# Patient Record
Sex: Male | Born: 1965 | Hispanic: No | Smoking: Former smoker
Health system: Southern US, Community
[De-identification: ages and names within clinical notes are randomized; demographics above are authoritative.]

## PROBLEM LIST (undated history)

## (undated) DIAGNOSIS — E785 Hyperlipidemia, unspecified: Secondary | ICD-10-CM

## (undated) DIAGNOSIS — Z87442 Personal history of urinary calculi: Secondary | ICD-10-CM

## (undated) DIAGNOSIS — G5602 Carpal tunnel syndrome, left upper limb: Secondary | ICD-10-CM

## (undated) DIAGNOSIS — M79606 Pain in leg, unspecified: Secondary | ICD-10-CM

## (undated) DIAGNOSIS — M545 Low back pain: Secondary | ICD-10-CM

## (undated) DIAGNOSIS — I739 Peripheral vascular disease, unspecified: Secondary | ICD-10-CM

## (undated) DIAGNOSIS — K4090 Unilateral inguinal hernia, without obstruction or gangrene, not specified as recurrent: Secondary | ICD-10-CM

## (undated) DIAGNOSIS — M1990 Unspecified osteoarthritis, unspecified site: Secondary | ICD-10-CM

## (undated) DIAGNOSIS — Z789 Other specified health status: Secondary | ICD-10-CM

## (undated) HISTORY — DX: Low back pain: M54.5

## (undated) HISTORY — DX: Other specified health status: Z78.9

## (undated) HISTORY — PX: KIDNEY STONE SURGERY: SHX686

---

## 2004-03-05 HISTORY — PX: KNEE SURGERY: SHX244

## 2004-12-11 ENCOUNTER — Ambulatory Visit: Payer: Self-pay | Admitting: Family Medicine

## 2005-02-27 ENCOUNTER — Ambulatory Visit: Payer: Self-pay | Admitting: Specialist

## 2005-03-15 ENCOUNTER — Encounter: Payer: Self-pay | Admitting: Specialist

## 2005-03-21 ENCOUNTER — Ambulatory Visit: Payer: Self-pay | Admitting: Otolaryngology

## 2005-04-05 ENCOUNTER — Encounter: Payer: Self-pay | Admitting: Specialist

## 2005-12-19 ENCOUNTER — Emergency Department: Payer: Self-pay | Admitting: Emergency Medicine

## 2017-01-03 ENCOUNTER — Ambulatory Visit (INDEPENDENT_AMBULATORY_CARE_PROVIDER_SITE_OTHER): Payer: Self-pay | Admitting: General Surgery

## 2017-01-03 ENCOUNTER — Encounter: Payer: Self-pay | Admitting: General Surgery

## 2017-01-03 VITALS — BP 118/72 | HR 86 | Temp 98.1°F | Resp 16 | Ht 65.0 in | Wt 158.0 lb

## 2017-01-03 DIAGNOSIS — K6289 Other specified diseases of anus and rectum: Secondary | ICD-10-CM

## 2017-01-03 MED ORDER — AMOXICILLIN-POT CLAVULANATE 875-125 MG PO TABS: 1.0000 | ORAL_TABLET | Freq: Two times a day (BID) | ORAL | 0 refills | Status: AC

## 2017-01-03 NOTE — Progress Notes (Signed)
Patient ID: [name], male   DOB: 1965/09/06, 51 y.o.   MRN: [id number]  Chief Complaint  Patient presents with  . Rectal Bleeding    HPI [name] is a 51 y.o. male.  Here for evaluation of rectal pain an bleeding referred by Dr Delanna Notice. He states he has had bleeding and left rectal pain for about 10 days. Pain is described as "burning". Bleeding is not with every BM and only a small amount. Symptoms have improved with hemorrhoid cream that he has been using for the past two days. Denies any previous episodes. Denies fever.   HPI  Past Medical History:  Diagnosis Date  . Patient denies medical problems     Past Surgical History:  Procedure Laterality Date  . KNEE SURGERY  2006    Family History  Problem Relation Age of Onset  . Diabetes Mother     Social History Social History  Substance Use Topics  . Smoking status: Current Every Day Smoker    Packs/day: 0.50    Years: 30.00    Types: Cigarettes  . Smokeless tobacco: Never Used  . Alcohol use No    No Known Allergies  Current Outpatient Prescriptions  Medication Sig Dispense Refill  . hydrocortisone cream 1 % Apply 1 application topically 2 (two) times daily.    Marland Kitchen amoxicillin-clavulanate (AUGMENTIN) 875-125 MG tablet Take 1 tablet by mouth 2 (two) times daily. 14 tablet 0   No current facility-administered medications for this visit.     Review of Systems Review of Systems  Constitutional: Negative.   Respiratory: Negative.   Cardiovascular: Negative.   Gastrointestinal: Negative for abdominal pain, constipation and diarrhea.    Blood pressure 118/72, pulse 86, temperature 98.1 F (36.7 C), temperature source Oral, resp. rate 16, height 5\' 5"  (1.651 m), weight 158 lb (71.7 kg).  Physical Exam Physical Exam  Constitutional: He is oriented to person, place, and time. He appears well-developed and well-nourished.  HENT:  Mouth/Throat: Oropharynx is clear and moist.  Eyes:  Conjunctivae are normal. No scleral icterus.  Neck: Neck supple.  Cardiovascular: Normal rate, regular rhythm and normal heart sounds.   Pulmonary/Chest: Effort normal and breath sounds normal.  Abdominal: Soft.  Genitourinary: Rectal exam shows tenderness (significant tenderness with palpation of left side of the rectum, no drainage, redness,  or induration appreciated). Rectal exam shows no external hemorrhoid and no fissure.  Genitourinary Comments: No external or internal hemorrhoids appreciated  Lymphadenopathy:    He has no cervical adenopathy.  Neurological: He is alert and oriented to person, place, and time.  Skin: Skin is warm and dry.  Psychiatric: His behavior is normal.    Data Reviewed None  Assessment    Left sided rectal pain. No hemorrhoids or anal fissure appreciated. Symptoms and physical exam suggestive of developing  perirectal abscess. No signs of systemic infection. Empiric abx treatment with Augmentin.     Plan    Empiric abx- Augmentin. Discussed with patient that if symptoms do not start improving in 2-3 days or get worse or he develops a fever then let us know. Follow up in 1 week.      HPI, Physical Exam, Assessment and Plan have been scribed under the direction and in the presence of Kathreen Cosier, MD Dorathy Daft, RN  I have completed the exam and reviewed the above documentation for accuracy and completeness.  I agree with the above.  Museum/gallery conservator has been used and any errors in dictation or  transcription are unintentional.  Seeplaputhur G. Evette CristalSankar, M.D., F.A.C.S.  Gerlene BurdockSANKAR,SEEPLAPUTHUR G 01/04/2017, 11:08 AM

## 2017-01-03 NOTE — Patient Instructions (Signed)
The patient is aware to call back for any questions or concerns.  

## 2017-01-09 ENCOUNTER — Ambulatory Visit (INDEPENDENT_AMBULATORY_CARE_PROVIDER_SITE_OTHER): Payer: Self-pay | Admitting: General Surgery

## 2017-01-09 ENCOUNTER — Encounter: Payer: Self-pay | Admitting: General Surgery

## 2017-01-09 VITALS — BP 102/68 | HR 98 | Resp 14 | Ht 65.0 in | Wt 157.0 lb

## 2017-01-09 DIAGNOSIS — K6289 Other specified diseases of anus and rectum: Secondary | ICD-10-CM

## 2017-01-09 MED ORDER — CIPROFLOXACIN HCL 500 MG PO TABS: 500.0000 mg | ORAL_TABLET | Freq: Two times a day (BID) | ORAL | 0 refills | Status: AC

## 2017-01-09 MED ORDER — METRONIDAZOLE 500 MG PO TABS
500.0000 mg | ORAL_TABLET | Freq: Three times a day (TID) | ORAL | 0 refills | Status: AC
Start: 2017-01-09 — End: 2017-01-16

## 2017-01-09 NOTE — Progress Notes (Signed)
Patient ID: [name], male   DOB: [birth year], 51 y.o.   MRN: [id number]  Chief Complaint  Patient presents with  . Follow-up    HPI [name] is a 51 y.o. male here today for his follow up rectal pain. Patient states he is doing much better. Admits mild pain. Bowel movements are okay. Denies drainage. Has one day of antibiotics left.  HPI  Past Medical History:  Diagnosis Date  . Patient denies medical problems     Past Surgical History:  Procedure Laterality Date  . KNEE SURGERY  2006    Family History  Problem Relation Age of Onset  . Diabetes Mother     Social History Social History   Tobacco Use  . Smoking status: Current Every Day Smoker    Packs/day: 0.50    Years: 30.00    Pack years: 15.00    Types: Cigarettes  . Smokeless tobacco: Never Used  Substance Use Topics  . Alcohol use: No  . Drug use: No    No Known Allergies  Current Outpatient Medications  Medication Sig Dispense Refill  . amoxicillin-clavulanate (AUGMENTIN) 875-125 MG tablet Take 1 tablet by mouth 2 (two) times daily. 14 tablet 0  . hydrocortisone cream 1 % Apply 1 application topically 2 (two) times daily.    . ciprofloxacin (CIPRO) 500 MG tablet Take 1 tablet (500 mg total) 2 (two) times daily for 10 days by mouth. 20 tablet 0  . metroNIDAZOLE (FLAGYL) 500 MG tablet Take 1 tablet (500 mg total) 3 (three) times daily for 7 days by mouth. 21 tablet 0   No current facility-administered medications for this visit.     Review of Systems Review of Systems  Constitutional: Negative.   Respiratory: Negative.   Cardiovascular: Negative.     Blood pressure 102/68, pulse 98, resp. rate 14, height 5\' 5"  (1.651 m), weight 157 lb (71.2 kg).  Physical Exam Physical Exam  Constitutional: He is oriented to person, place, and time. He appears well-developed and well-nourished.  Abdominal: Soft. Bowel sounds are normal. There is no tenderness.  Genitourinary: Rectal exam shows  tenderness (Significant tenderness with palpation on the left rectal area. No induration, bleeding, or drainage appreciated. ). Rectal exam shows no external hemorrhoid, no internal hemorrhoid and anal tone normal.  Neurological: He is alert and oriented to person, place, and time.  Skin: Skin is warm and dry.    Data Reviewed Prior notes reviewed   Assessment      Continued tenderness in the left rectal area suspicious for abscess. Some improvement with oral antibiotics. No signs of systemic infection. Discussed diagnostic CT vs. Continued antibiotic course for management options. Pt elected for another week of antibiotics, ciprofloxacin and flagyl. Re-evaluate after antibiotic course. If pain does not resolve then plan for CT imaging.   Plan    Patient to return in three weeks. The patient is aware to call back for any questions or concerns.      HPI, Physical Exam, Assessment and Plan have been scribed under the direction and in the presence of Kathreen CosierS. [name]. [name], MD  Ples SpecterJessica Qualls, CMA    I have completed the exam and reviewed the above documentation for accuracy and completeness.  I agree with the above.  Museum/gallery conservatorDragon Technology has been used and any errors in dictation or transcription are unintentional.  [name]. Evette CristalSankar, M.D., F.A.C.S.  Gerlene BurdockSANKAR,[name] 01/10/2017, 7:52 AM

## 2017-01-09 NOTE — Patient Instructions (Addendum)
Patient to return in three weeks.  

## 2017-01-23 ENCOUNTER — Ambulatory Visit (INDEPENDENT_AMBULATORY_CARE_PROVIDER_SITE_OTHER): Payer: Self-pay | Admitting: General Surgery

## 2017-01-23 ENCOUNTER — Encounter: Payer: Self-pay | Admitting: General Surgery

## 2017-01-23 VITALS — BP 122/74 | HR 91 | Resp 14 | Ht 65.0 in | Wt 159.0 lb

## 2017-01-23 DIAGNOSIS — K6289 Other specified diseases of anus and rectum: Secondary | ICD-10-CM

## 2017-01-23 NOTE — Progress Notes (Signed)
Patient ID: [name], male   DOB: 1965-03-13, 51 y.o.   MRN: [id number]  Chief Complaint  Patient presents with  . Follow-up    HPI [name] is a 51 y.o. male.  Here today for follow up rectal pain. He denies any pain or bleeidng. He completed the antibiotics 2 days ago.  HPI  Past Medical History:  Diagnosis Date  . Patient denies medical problems     Past Surgical History:  Procedure Laterality Date  . KNEE SURGERY  2006    Family History  Problem Relation Age of Onset  . Diabetes Mother     Social History Social History   Tobacco Use  . Smoking status: Current Every Day Smoker    Packs/day: 0.50    Years: 30.00    Pack years: 15.00    Types: Cigarettes  . Smokeless tobacco: Never Used  Substance Use Topics  . Alcohol use: No  . Drug use: No    No Known Allergies  No current outpatient medications on file.   No current facility-administered medications for this visit.     Review of Systems Review of Systems  Constitutional: Negative.   Respiratory: Negative.   Cardiovascular: Negative.     Blood pressure 122/74, pulse 91, resp. rate 14, height 5\' 5"  (1.651 m), weight 159 lb (72.1 kg).  Physical Exam Physical Exam  Constitutional: He is oriented to person, place, and time. He appears well-developed and well-nourished.  Genitourinary:  Genitourinary Comments: No rectal tenderness  Neurological: He is alert and oriented to person, place, and time.  Skin: Skin is warm and dry.  Psychiatric: His behavior is normal.    Data Reviewed Prior notes reviewed     Assessment    Resolved rectal pain-suspected perirectal abscess, appears resolved with antibiotic    Plan   Advised the abscess may recur  and to call for any recurrence of symptoms Recommend colonoscopy next year,  he is to call when he is ready to proceed     HPI, Physical Exam, Assessment and Plan have been scribed under the direction and in the presence of Kathreen CosierS. G.  Britton Perkinson, MD Dorathy DaftMarsha Hatch, RN  I have completed the exam and reviewed the above documentation for accuracy and completeness.  I agree with the above.  Museum/gallery conservatorDragon Technology has been used and any errors in dictation or transcription are unintentional.  Livi Mcgann G. Evette CristalSankar, M.D., F.A.C.S.  Gerlene BurdockSANKAR,[place] Dal[place]o G 01/28/2017, 4:28 PM

## 2017-01-23 NOTE — Patient Instructions (Addendum)
The patient is aware to call back for any questions or concerns.  Recommend colonoscopy next year, he is to call when he is ready to proceed

## 2017-01-28 ENCOUNTER — Encounter: Payer: Self-pay | Admitting: General Surgery

## 2017-02-27 ENCOUNTER — Telehealth: Payer: Self-pay | Admitting: *Deleted

## 2017-02-27 NOTE — Telephone Encounter (Signed)
Patients son [name] called regarding getting the patient set up for a Colonoscopy. He last saw Dr.[name] on 01/23/17 and was told that he could go ahead a proceed with a Colonoscopy in 2019 and patient is ready to get that scheduled. Does the patient need to come in and see you first prior to getting the colonoscopy scheduled?

## 2017-03-06 NOTE — Telephone Encounter (Signed)
If the patient has not had a recurrence of his rectal pain symptoms for which he saw Dr. Orbie HurstSang Carr in November 2018, a preoperative visit prior to a screening colonoscopy is not required.

## 2017-03-14 ENCOUNTER — Ambulatory Visit: Payer: BLUE CROSS/BLUE SHIELD | Admitting: General Surgery

## 2017-03-14 ENCOUNTER — Encounter: Payer: Self-pay | Admitting: General Surgery

## 2017-03-14 VITALS — BP 115/78 | HR 68 | Resp 14 | Ht 65.0 in | Wt 160.0 lb

## 2017-03-14 DIAGNOSIS — Z1211 Encounter for screening for malignant neoplasm of colon: Secondary | ICD-10-CM

## 2017-03-14 DIAGNOSIS — K6289 Other specified diseases of anus and rectum: Secondary | ICD-10-CM | POA: Diagnosis not present

## 2017-03-14 MED ORDER — POLYETHYLENE GLYCOL 3350 17 GM/SCOOP PO POWD: ORAL | 0 refills | Status: DC

## 2017-03-14 NOTE — Patient Instructions (Addendum)
Colonoscopy, Adult A colonoscopy is an exam to look at the large intestine. It is done to check for problems, such as:  Lumps (tumors).  Growths (polyps).  Swelling (inflammation).  Bleeding.  What happens before the procedure? Eating and drinking Follow instructions from your doctor about eating and drinking. These instructions may include:  A few days before the procedure - follow a low-fiber diet. ? Avoid nuts. ? Avoid seeds. ? Avoid dried fruit. ? Avoid raw fruits. ? Avoid vegetables.  1-3 days before the procedure - follow a clear liquid diet. Avoid liquids that have red or purple dye. Drink only clear liquids, such as: ? Clear broth or bouillon. ? Black coffee or tea. ? Clear juice. ? Clear soft drinks or sports drinks. ? Gelatin dessert. ? Popsicles.  On the day of the procedure - do not eat or drink anything during the 2 hours before the procedure.  Bowel prep If you were prescribed an oral bowel prep:  Take it as told by your doctor. Starting the day before your procedure, you will need to drink a lot of liquid. The liquid will cause you to poop (have bowel movements) until your poop is almost clear or light green.  If your skin or butt gets irritated from diarrhea, you may: ? Wipe the area with wipes that have medicine in them, such as adult wet wipes with aloe and vitamin E. ? Put something on your skin that soothes the area, such as petroleum jelly.  If you throw up (vomit) while drinking the bowel prep, take a break for up to 60 minutes. Then begin the bowel prep again. If you keep throwing up and you cannot take the bowel prep without throwing up, call your doctor.  General instructions  Ask your doctor about changing or stopping your normal medicines. This is important if you take diabetes medicines or blood thinners.  Plan to have someone take you home from the hospital or clinic. What happens during the procedure?  An IV tube may be put into one of  your veins.  You will be given medicine to help you relax (sedative).  To reduce your risk of infection: ? Your doctors will wash their hands. ? Your anal area will be washed with soap.  You will be asked to lie on your side with your knees bent.  Your doctor will get a long, thin, flexible tube ready. The tube will have a camera and a light on the end.  The tube will be put into your anus.  The tube will be gently put into your large intestine.  Air will be delivered into your large intestine to keep it open. You may feel some pressure or cramping.  The camera will be used to take photos.  A small tissue sample may be removed from your body to be looked at under a microscope (biopsy). If any possible problems are found, the tissue will be sent to a lab for testing.  If small growths are found, your doctor may remove them and have them checked for cancer.  The tube that was put into your anus will be slowly removed. The procedure may vary among doctors and hospitals. What happens after the procedure?  Your doctor will check on you often until the medicines you were given have worn off.  Do not drive for 24 hours after the procedure.  You may have a small amount of blood in your poop.  You may pass gas.  You may  have mild cramps or bloating in your belly (abdomen).  It is up to you to get the results of your procedure. Ask your doctor, or the department performing the procedure, when your results will be ready. This information is not intended to replace advice given to you by your health care provider. Make sure you discuss any questions you have with your health care provider. Document Released: 03/24/2010 Document Revised: 12/21/2015 Document Reviewed: 05/03/2015 Elsevier Interactive Patient Education  2017 Elsevier [employer]   May use a hemorrhoid suppository to aid with bowel movement when needed. These are available over the counter.   Anusol Suppository.

## 2017-03-14 NOTE — Progress Notes (Signed)
Patient ID: [name], male   DOB: 13-Nov-1965, 52 y.o.   MRN: [id number]  Chief Complaint  Patient presents with  . Other    rectal pain    HPI [name] is a 52 y.o. male here for follow up from rectal pain and to discuss having a colonoscopy. He is here today with his son, [name].  The patient appears to have excellent comprehension of English, but his son was helpful elucidating some points regarding his father's intermittent discomfort. He reports that he has had rectal pain on the left side and bleeding with a bowel movement. He has had this for the last 2 months off and on and has been treated with antibiotics for a possible abscess. He states the pain when he moves his bowels and the pain goes away slowly.  He reports that in the last 3 days the pain has gone away, but he still feels like something is still not right.  Generally, markedly improved from October.  Occasionally has a small spot of bright red blood after bowel movements. He reports no fever or chills.   HPI  Past Medical History:  Diagnosis Date  . Patient denies medical problems     Past Surgical History:  Procedure Laterality Date  . KNEE SURGERY  2006    Family History  Problem Relation Age of Onset  . Diabetes Mother     Social History Social History   Tobacco Use  . Smoking status: Current Every Day Smoker    Packs/day: 0.50    Years: 30.00    Pack years: 15.00    Types: Cigarettes  . Smokeless tobacco: Never Used  Substance Use Topics  . Alcohol use: No  . Drug use: No    No Known Allergies  Current Outpatient Medications  Medication Sig Dispense Refill  . polyethylene glycol powder (GLYCOLAX/MIRALAX) powder 255 grams one bottle for colonoscopy prep 255 g 0   No current facility-administered medications for this visit.     Review of Systems Review of Systems  Constitutional: Negative.   Respiratory: Negative.   Cardiovascular: Negative.   Gastrointestinal: Positive  for blood in stool and rectal pain. Negative for abdominal distention, abdominal pain, anal bleeding, constipation, diarrhea, nausea and vomiting.    Blood pressure 115/78, pulse 68, resp. rate 14, height 5\' 5"  (1.651 m), weight 160 lb (72.6 kg).  Physical Exam Physical Exam  Constitutional: He is oriented to person, place, and time. He appears well-developed and well-nourished.  Eyes: Conjunctivae are normal. No scleral icterus.  Neck: Neck supple.  Cardiovascular: Normal rate, regular rhythm and normal heart sounds.  Pulmonary/Chest: Effort normal and breath sounds normal.  Abdominal: Normal appearance and bowel sounds are normal. There is no tenderness.  Genitourinary: Rectal exam shows tenderness.     Lymphadenopathy:    He has no cervical adenopathy.    He has no axillary adenopathy.  Neurological: He is alert and oriented to person, place, and time.  Skin: Skin is warm and dry.  Psychiatric: He has a normal mood and affect.      Assessment    No evidence of perirectal abscess.  Tenderness along the anal canal, but possible occult fissure.  Small amount of rectal bleeding.    Plan     Colonoscopy with possible biopsy/polypectomy prn: Information regarding the procedure, including its potential risks and complications (including but not limited to perforation of the bowel, which may require emergency surgery to repair, and bleeding) was verbally given to the  patient. Educational information regarding lower intestinal endoscopy was given to the patient. Written instructions for how to complete the bowel prep using Miralax were provided. The importance of drinking ample fluids to avoid dehydration as a result of the prep emphasized.  HPI, Physical Exam, Assessment and Plan have been scribed under the direction and in the presence of Earline MayotteJeffrey W. Celena Lanius, MD  Carron Brazenaryl-Lyn Kennedy, LPN   Merrily PewJeffrey W Sheryll Dymek 03/15/2017, 7:25 PM  Patient has [facility]. Miralax prescription has been sent in to the patient's pharmacy today. Colonoscopy instructions have been reviewed with the patient. This patient is aware to call the office if they have further questions.   Nicholes MangoMichele J. Bailey, CMA

## 2017-03-15 DIAGNOSIS — Z1211 Encounter for screening for malignant neoplasm of colon: Secondary | ICD-10-CM | POA: Insufficient documentation

## 2017-03-15 DIAGNOSIS — K6289 Other specified diseases of anus and rectum: Secondary | ICD-10-CM | POA: Insufficient documentation

## 2017-03-18 ENCOUNTER — Telehealth: Payer: Self-pay | Admitting: *Deleted

## 2017-03-18 MED ORDER — DOXYCYCLINE HYCLATE 100 MG PO CAPS: 100.0000 mg | ORAL_CAPSULE | Freq: Two times a day (BID) | ORAL | 1 refills | Status: DC

## 2017-03-18 NOTE — Telephone Encounter (Signed)
Will RX with Doxycycline to minimize exposure to broad spectrum antibiotics.

## 2017-03-18 NOTE — Telephone Encounter (Signed)
Patients son Renella CunasHooman called stating that when the patient saw Dr.[name] on 01/03/17 he prescribed him Augmentin to help with the rectal pain. He started the Augmentin on 01/03/17 and finished them on 01/21/17. The last couple of days patients son stated that his rectal pain is coming back and was wanting to know could he get more antibiotic until his colonoscopy, which is scheduled for 03/27/16

## 2017-03-19 NOTE — Telephone Encounter (Signed)
Left message for patient to call the office back

## 2017-03-20 NOTE — Telephone Encounter (Signed)
Patient called back and is aware that RX was called into the pharmacy, take twice a day and to avoid dairy products.

## 2017-03-26 ENCOUNTER — Telehealth: Payer: Self-pay | Admitting: Neurology

## 2017-03-26 ENCOUNTER — Encounter: Payer: Self-pay | Admitting: *Deleted

## 2017-03-26 NOTE — Telephone Encounter (Signed)
Spoke with patient's doctor who asked us to get patient an appointment sooner if possible. We called patient and offered him an appointment tomorrow and he declined. We will try to find him something else but it may be several weeks out, patient understands.

## 2017-03-27 ENCOUNTER — Ambulatory Visit: Payer: BLUE CROSS/BLUE SHIELD | Admitting: Anesthesiology

## 2017-03-27 ENCOUNTER — Ambulatory Visit
Admission: RE | Admit: 2017-03-27 | Discharge: 2017-03-27 | Disposition: A | Discharge status: Home / Self Care | Payer: BLUE CROSS/BLUE SHIELD | Source: Ambulatory Visit | Attending: General Surgery | Admitting: General Surgery

## 2017-03-27 ENCOUNTER — Encounter: Payer: Self-pay | Admitting: *Deleted

## 2017-03-27 ENCOUNTER — Encounter
Admission: RE | Disposition: A | Discharge status: Home / Self Care | Payer: Self-pay | Source: Ambulatory Visit | Attending: General Surgery

## 2017-03-27 DIAGNOSIS — F17210 Nicotine dependence, cigarettes, uncomplicated: Secondary | ICD-10-CM | POA: Insufficient documentation

## 2017-03-27 DIAGNOSIS — Z1211 Encounter for screening for malignant neoplasm of colon: Secondary | ICD-10-CM | POA: Diagnosis not present

## 2017-03-27 HISTORY — PX: COLONOSCOPY WITH PROPOFOL: SHX5780

## 2017-03-27 SURGERY — COLONOSCOPY WITH PROPOFOL
Anesthesia: General

## 2017-03-27 MED ORDER — PROPOFOL 500 MG/50ML IV EMUL
INTRAVENOUS | Status: AC
  Filled 2017-03-27: qty 50

## 2017-03-27 MED ORDER — PHENYLEPHRINE HCL 10 MG/ML IJ SOLN
INTRAMUSCULAR | Status: DC | PRN
  Administered 2017-03-27: 100 ug via INTRAVENOUS

## 2017-03-27 MED ORDER — GLYCOPYRROLATE 0.2 MG/ML IJ SOLN
INTRAMUSCULAR | Status: DC | PRN
  Administered 2017-03-27: 0.2 mg via INTRAVENOUS

## 2017-03-27 MED ORDER — LIDOCAINE HCL (CARDIAC) 20 MG/ML IV SOLN
INTRAVENOUS | Status: DC | PRN
  Administered 2017-03-27: 80 mg via INTRAVENOUS

## 2017-03-27 MED ORDER — PROPOFOL 500 MG/50ML IV EMUL
INTRAVENOUS | Status: DC | PRN
  Administered 2017-03-27: 160 ug/kg/min via INTRAVENOUS

## 2017-03-27 MED ORDER — MIDAZOLAM HCL 2 MG/2ML IJ SOLN
INTRAMUSCULAR | Status: AC
  Filled 2017-03-27: qty 2

## 2017-03-27 MED ORDER — GLYCOPYRROLATE 0.2 MG/ML IJ SOLN
INTRAMUSCULAR | Status: AC
  Filled 2017-03-27: qty 1

## 2017-03-27 MED ORDER — SODIUM CHLORIDE 0.9 % IV SOLN
INTRAVENOUS | Status: DC
  Administered 2017-03-27: 1000 mL via INTRAVENOUS
  Administered 2017-03-27: 08:00:00 via INTRAVENOUS

## 2017-03-27 MED ORDER — LIDOCAINE HCL (PF) 2 % IJ SOLN
INTRAMUSCULAR | Status: AC
  Filled 2017-03-27: qty 10

## 2017-03-27 MED ORDER — MIDAZOLAM HCL 2 MG/2ML IJ SOLN
INTRAMUSCULAR | Status: DC | PRN
  Administered 2017-03-27: 2 mg via INTRAVENOUS

## 2017-03-27 MED ORDER — PROPOFOL 10 MG/ML IV BOLUS
INTRAVENOUS | Status: DC | PRN
  Administered 2017-03-27: 60 mg via INTRAVENOUS

## 2017-03-27 NOTE — Anesthesia Post-op Follow-up Note (Signed)
Anesthesia QCDR form completed.        

## 2017-03-27 NOTE — Transfer of Care (Signed)
Immediate Anesthesia Transfer of Care Note  Patient: [name]  Procedure(s) Performed: COLONOSCOPY WITH PROPOFOL (N/A )  Patient Location: PACU  Anesthesia Type:General  Level of Consciousness: drowsy and patient cooperative  Airway & Oxygen Therapy: Patient Spontanous Breathing and Patient connected to nasal cannula oxygen  Post-op Assessment: Report given to RN and Post -op Vital signs reviewed and stable  Post vital signs: Reviewed and stable  Last Vitals:  Vitals:   03/27/17 0719 03/27/17 0810  BP: 112/69 101/66  Pulse: 63 63  Resp: 16 20  Temp: (!) 36.1 C (!) 36.1 C  SpO2:  98%    Last Pain:  Vitals:   03/27/17 0810  TempSrc: Tympanic         Complications: No apparent anesthesia complications

## 2017-03-27 NOTE — Anesthesia Preprocedure Evaluation (Signed)
Anesthesia Evaluation  Patient identified by MRN, date of birth, ID band Patient awake    Reviewed: Allergy & Precautions, NPO status , Patient's Chart, lab work & pertinent test results  History of Anesthesia Complications Negative for: history of anesthetic complications  Airway Mallampati: II  TM Distance: >3 FB Neck ROM: Full    Dental no notable dental hx.    Pulmonary neg sleep apnea, neg COPD, Current Smoker,    breath sounds clear to auscultation- rhonchi (-) wheezing      Cardiovascular Exercise Tolerance: Good (-) hypertension(-) CAD and (-) Past MI  Rhythm:Regular Rate:Normal - Systolic murmurs and - Diastolic murmurs    Neuro/Psych negative neurological ROS  negative psych ROS   GI/Hepatic negative GI ROS, Neg liver ROS,   Endo/Other  negative endocrine ROSneg diabetes  Renal/GU negative Renal ROS     Musculoskeletal negative musculoskeletal ROS (+)   Abdominal (+) - obese,   Peds  Hematology negative hematology ROS (+)   Anesthesia Other Findings   Reproductive/Obstetrics                             Anesthesia Physical Anesthesia Plan  ASA: II  Anesthesia Plan: General   Post-op Pain Management:    Induction: Intravenous  PONV Risk Score and Plan: 0 and Propofol infusion  Airway Management Planned: Natural Airway  Additional Equipment:   Intra-op Plan:   Post-operative Plan:   Informed Consent: I have reviewed the patients History and Physical, chart, labs and discussed the procedure including the risks, benefits and alternatives for the proposed anesthesia with the patient or authorized representative who has indicated his/her understanding and acceptance.   Dental advisory given  Plan Discussed with: CRNA and Anesthesiologist  Anesthesia Plan Comments:         Anesthesia Quick Evaluation

## 2017-03-27 NOTE — H&P (Signed)
No change in clinical history or exam. Less perineal discomfort with oral antibiotics. Tolerated prep well. For colonoscopy.

## 2017-03-27 NOTE — Op Note (Signed)
[facility] Patient Name: [name] Procedure Date: 03/27/2017 7:48 AM MRN: [id number] Account #: [id number] Date of Birth: 23-Sep-1965 Admit Type: Outpatient Age: 52 Room: [facility] ENDO ROOM 1 Gender: Male Note Status: Finalized Procedure:            Colonoscopy Indications:          Screening for colorectal malignant neoplasm Providers:            Earline Mayotte, MD Referring MD:         Sherrie Mustache, MD (Referring MD) Medicines:            Monitored Anesthesia Care Complications:        No immediate complications. Procedure:            Pre-Anesthesia Assessment:                       - Prior to the procedure, a History and Physical was                        performed, and patient medications, allergies and                        sensitivities were reviewed. The patient's tolerance of                        previous anesthesia was reviewed.                       - The risks and benefits of the procedure and the                        sedation options and risks were discussed with the                        patient. All questions were answered and informed                        consent was obtained.                       After obtaining informed consent, the colonoscope was                        passed under direct vision. Throughout the procedure,                        the patient's blood pressure, pulse, and oxygen                        saturations were monitored continuously. The                        Colonoscope was introduced through the anus and                        advanced to the the terminal ileum. The colonoscopy was                        performed without difficulty. The patient tolerated the  procedure well. The quality of the bowel preparation                        was excellent. Findings:      The entire examined colon appeared normal on direct and retroflexion       views. Impression:            - The entire examined colon is normal on direct and                        retroflexion views.                       - No specimens collected. Recommendation:       - Return to endoscopist in 2 weeks. Procedure Code(s):    --- Professional ---                       [id number], Colonoscopy, flexible; diagnostic, including                        collection of specimen(s) by brushing or washing, when                        performed (separate procedure) Diagnosis Code(s):    --- Professional ---                       Z12.11, Encounter for screening for malignant neoplasm                        of colon CPT copyright 2016 American Medical Association. All rights reserved. The codes documented in this report are preliminary and upon coder review may  be revised to meet current compliance requirements. Earline MayotteJeffrey W. Marty Uy, MD 03/27/2017 8:07:42 AM This report has been signed electronically. Number of Addenda: 0 Note Initiated On: 03/27/2017 7:48 AM Scope Withdrawal Time: 0 hours 8 minutes 54 seconds  Total Procedure Duration: 0 hours 13 minutes 0 seconds       [facility]

## 2017-03-27 NOTE — Anesthesia Postprocedure Evaluation (Signed)
Anesthesia Post Note  Patient: [name]  Procedure(s) Performed: COLONOSCOPY WITH PROPOFOL (N/A )  Patient location during evaluation: Endoscopy Anesthesia Type: General Level of consciousness: awake and alert and oriented Pain management: pain level controlled Vital Signs Assessment: post-procedure vital signs reviewed and stable Respiratory status: spontaneous breathing, nonlabored ventilation and respiratory function stable Cardiovascular status: blood pressure returned to baseline and stable Postop Assessment: no signs of nausea or vomiting Anesthetic complications: no     Last Vitals:  Vitals:   03/27/17 0830 03/27/17 0838  BP:    Pulse: 66 63  Resp: 15 17  Temp:    SpO2: 96% 96%    Last Pain:  Vitals:   03/27/17 0810  TempSrc: Tympanic                 [name]

## 2017-03-28 ENCOUNTER — Encounter: Payer: Self-pay | Admitting: General Surgery

## 2017-04-04 ENCOUNTER — Ambulatory Visit: Payer: BLUE CROSS/BLUE SHIELD | Admitting: Neurology

## 2017-04-04 ENCOUNTER — Encounter: Payer: Self-pay | Admitting: Neurology

## 2017-04-04 DIAGNOSIS — M5441 Lumbago with sciatica, right side: Secondary | ICD-10-CM | POA: Diagnosis not present

## 2017-04-04 DIAGNOSIS — M5442 Lumbago with sciatica, left side: Secondary | ICD-10-CM

## 2017-04-04 DIAGNOSIS — M545 Low back pain, unspecified: Secondary | ICD-10-CM

## 2017-04-04 DIAGNOSIS — G8929 Other chronic pain: Secondary | ICD-10-CM

## 2017-04-04 HISTORY — DX: Low back pain, unspecified: M54.50

## 2017-04-04 NOTE — Patient Instructions (Signed)
We will get MRI of the low back and get EMG and NCV study to look at the nerve function of the legs.

## 2017-04-04 NOTE — Progress Notes (Signed)
Reason for visit: Back pain, leg pain  Referring physician: Dr. Audery AmelJadali  [name] is a 52 y.o. male  History of present illness:  [name] is a 52 year old right-handed [place] male with a history of low back pain that began 4 to 5 months ago.  The patient indicates that the problem began spontaneously, he is having pain in the back that may go down into the anterior thighs to the knees, and if he continues to walk the pain will go down to the feet bilaterally.  Eventually, if he continues to walk the legs will begin to have some numbness and tingling sensations.  The patient indicates that if he rests, the discomfort will go away.  He begins having pain down the legs after taking only 20 steps or so.  He works as a Merchandiser, retailsupervisor, his job requires a lot of walking, he is having problems managing at work.  The patient feels that his legs may get weak at times.  He has not had any falls.  He denies issues controlling the bowels or the bladder.  The patient indicates that if he stands and he does not walk, this does not induce pain.  With sitting or lying down he is very comfortable without discomfort.  The patient denies any neck pain or pain down the arms.  He denies any significant balance issues.  He is sent to this office for an evaluation.  He has undergone chiropractic treatments without benefit, he has undergone a treatment with a steroid Dosepak with transient benefit.  He has undergone an x-ray of the low back that shows mild endplate osteophyte formation at the L4 and L5 level anteriorly.  There is some facet joint arthritis at the L5-S1 level.   Past Medical History:  Diagnosis Date  . Low back pain 04/04/2017  . Patient denies medical problems     Past Surgical History:  Procedure Laterality Date  . COLONOSCOPY WITH PROPOFOL N/A 03/27/2017   Procedure: COLONOSCOPY WITH PROPOFOL;  Surgeon: Earline MayotteByrnett, Jeffrey W, MD;  Location: [facility] ENDOSCOPY;  Service: Endoscopy;   Laterality: N/A;  . KIDNEY STONE SURGERY    . KNEE SURGERY  2006    Family History  Problem Relation Age of Onset  . Diabetes Mother   . Heart disease Mother   . Alzheimer's disease Father     Social history:  reports that he quit smoking 4 days ago. he has never used smokeless tobacco. He reports that he does not drink alcohol or use drugs.  Medications:  Prior to Admission medications   Not on File     No Known Allergies  ROS:  Out of a complete 14 system review of symptoms, the patient complains only of the following symptoms, and all other reviewed systems are negative.  Back pain, leg pain  Blood pressure 107/70, pulse 73, height 5\' 5"  (1.651 m), weight 160 lb (72.6 kg).  Physical Exam  General: The patient is alert and cooperative at the time of the examination.  Eyes: Pupils are equal, round, and reactive to light. Discs are flat bilaterally.  Neck: The neck is supple, no carotid bruits are noted.  Respiratory: The respiratory examination is clear.  Cardiovascular: The cardiovascular examination reveals a regular rate and rhythm, no obvious murmurs or rubs are noted.  Neuromuscular: The patient is able to flex the back to around 110 degrees.  He has good extension and rotational movements of the back.  He has no significant discomfort with  palpation of the back or over the SI joints.  Skin: Extremities are without significant edema.  Neurologic Exam  Mental status: The patient is alert and oriented x 3 at the time of the examination. The patient has apparent normal recent and remote memory, with an apparently normal attention span and concentration ability.  Cranial nerves: Facial symmetry is present. There is good sensation of the face to pinprick and soft touch bilaterally. The strength of the facial muscles and the muscles to head turning and shoulder shrug are normal bilaterally. Speech is well enunciated, no aphasia or dysarthria is noted. Extraocular  movements are full. Visual fields are full. The tongue is midline, and the patient has symmetric elevation of the soft palate. No obvious hearing deficits are noted.  Motor: The motor testing reveals 5 over 5 strength of all 4 extremities. Good symmetric motor tone is noted throughout.  Sensory: Sensory testing is intact to pinprick, soft touch, vibration sensation, and position sense on all 4 extremities. No evidence of extinction is noted.  Coordination: Cerebellar testing reveals good finger-nose-finger and heel-to-shin bilaterally.  Gait and station: Gait is normal. Tandem gait is normal. Romberg is negative. No drift is seen.  The patient is able to walk on heels and the toes bilaterally.  Reflexes: Deep tendon reflexes are symmetric and normal bilaterally. Toes are downgoing bilaterally.   Assessment/Plan:  1.  Low back pain, bilateral leg pain  The patient is describing back pain and leg discomfort that begins with walking, and is improved with rest.  The pain is worse on the left leg than the right.  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  The patient will undergo MRI of the lumbar spine, he will have nerve conduction studies done on both legs and EMG on the left leg.  The patient has failed conservative treatment with chiropractic treatments and steroid administration.  [name] MD 04/04/2017 9:39 AM  [facility] [address] [place], [place] [address]  Phone [phone] Fax [phone]

## 2017-04-09 ENCOUNTER — Ambulatory Visit
Admission: RE | Admit: 2017-04-09 | Discharge: 2017-04-09 | Disposition: A | Discharge status: Home / Self Care | Payer: BLUE CROSS/BLUE SHIELD | Source: Ambulatory Visit | Attending: Neurology | Admitting: Neurology

## 2017-04-09 DIAGNOSIS — M5441 Lumbago with sciatica, right side: Principal | ICD-10-CM

## 2017-04-09 DIAGNOSIS — G8929 Other chronic pain: Secondary | ICD-10-CM

## 2017-04-09 DIAGNOSIS — M5442 Lumbago with sciatica, left side: Principal | ICD-10-CM

## 2017-04-10 ENCOUNTER — Telehealth: Payer: Self-pay | Admitting: Neurology

## 2017-04-10 DIAGNOSIS — Z5181 Encounter for therapeutic drug level monitoring: Secondary | ICD-10-CM

## 2017-04-10 DIAGNOSIS — R0989 Other specified symptoms and signs involving the circulatory and respiratory systems: Secondary | ICD-10-CM

## 2017-04-10 NOTE — Telephone Encounter (Signed)
I called the son, I discussed the treatment plan with him, he appears to understand and will discuss this with his father.

## 2017-04-10 NOTE — Telephone Encounter (Signed)
Pts son is calling wanting Dr. Anne HahnWillis to call him back mainly to discuss everything since his father didn't fully understand. Please call back at [phone]

## 2017-04-10 NOTE — Telephone Encounter (Signed)
I called the patient.  MRI of the lumbar spine was unremarkable, but there was some evidence of a possible mural thrombus or dissection within the aorta.  This could potentially result in the claudication symptoms that the patient is reporting.  The patient will come in for blood work to check the kidney function, we will then order a CT angiogram of the abdomen and pelvis.   MRI lumbar 04/09/17:  IMPRESSION: 1. Mild for age disc bulging at L1-2, L4-5, and L5-S1 without significant stenosis or neural impingement. 2. Mild facet bilateral facet hypertrophy at L3-4 through L5-S1. 3. Curvilinear density within the infrarenal aorta as above. While this finding may reflect mural thrombus due to underlying atheromatous disease, a possible thrombosed dissection could also have this appearance. Further evaluation with dedicated CTA of the abdomen and pelvis suggested for further characterization.

## 2017-04-11 ENCOUNTER — Other Ambulatory Visit: Payer: Self-pay | Admitting: *Deleted

## 2017-04-11 ENCOUNTER — Other Ambulatory Visit (INDEPENDENT_AMBULATORY_CARE_PROVIDER_SITE_OTHER): Payer: Self-pay

## 2017-04-11 DIAGNOSIS — Z0289 Encounter for other administrative examinations: Secondary | ICD-10-CM

## 2017-04-11 DIAGNOSIS — Z5181 Encounter for therapeutic drug level monitoring: Secondary | ICD-10-CM

## 2017-04-12 ENCOUNTER — Telehealth: Payer: Self-pay

## 2017-04-12 LAB — COMPREHENSIVE METABOLIC PANEL
ALBUMIN: 4.4 g/dL (ref 3.5–5.5)
ALT: 51 IU/L — ABNORMAL HIGH (ref 0–44)
AST: 34 IU/L (ref 0–40)
Albumin/Globulin Ratio: 1.8 (ref 1.2–2.2)
Alkaline Phosphatase: 82 IU/L (ref 39–117)
BUN / CREAT RATIO: 16 (ref 9–20)
BUN: 11 mg/dL (ref 6–24)
Bilirubin Total: 1.2 mg/dL (ref 0.0–1.2)
CO2: 25 mmol/L (ref 20–29)
CREATININE: 0.67 mg/dL — AB (ref 0.76–1.27)
Calcium: 9.7 mg/dL (ref 8.7–10.2)
Chloride: 103 mmol/L (ref 96–106)
GFR calc Af Amer: 128 mL/min/{1.73_m2} (ref >59)
GFR calc non Af Amer: 110 mL/min/{1.73_m2} (ref >59)
GLOBULIN, TOTAL: 2.5 g/dL (ref 1.5–4.5)
Glucose: 95 mg/dL (ref 65–99)
Potassium: 4.2 mmol/L (ref 3.5–5.2)
SODIUM: 141 mmol/L (ref 134–144)
Total Protein: 6.9 g/dL (ref 6.0–8.5)

## 2017-04-12 NOTE — Telephone Encounter (Signed)
I spoke to pt. I advised him that his blood work was unremarkable except for a minimal increase in ALT which was not clinically significant, pt's renal function is adequate and pt is ok for CT angiogram. Pt verbalized understanding of results. Pt had no questions at this time but was encouraged to call back if questions arise.

## 2017-04-12 NOTE — Telephone Encounter (Signed)
-----   Message from York Spanielharles K Willis, MD sent at 04/12/2017  7:31 AM EST ----- Blood work is unremarkable exception of minimal increase in ALT level, not clinically significant.  Renal function is adequate, okay for CT angiogram. Please call the patient. ----- Message ----- From: Interface, [facility] Lab Results In Sent: 04/12/2017   5:41 AM To: York Spanielharles K Willis, MD

## 2017-04-17 ENCOUNTER — Encounter (INDEPENDENT_AMBULATORY_CARE_PROVIDER_SITE_OTHER): Payer: Self-pay

## 2017-04-17 ENCOUNTER — Ambulatory Visit (INDEPENDENT_AMBULATORY_CARE_PROVIDER_SITE_OTHER): Payer: BLUE CROSS/BLUE SHIELD | Admitting: Neurology

## 2017-04-17 ENCOUNTER — Encounter: Payer: Self-pay | Admitting: Neurology

## 2017-04-17 ENCOUNTER — Ambulatory Visit: Payer: BLUE CROSS/BLUE SHIELD | Admitting: Neurology

## 2017-04-17 DIAGNOSIS — G8929 Other chronic pain: Secondary | ICD-10-CM

## 2017-04-17 DIAGNOSIS — M5441 Lumbago with sciatica, right side: Secondary | ICD-10-CM

## 2017-04-17 DIAGNOSIS — M5442 Lumbago with sciatica, left side: Secondary | ICD-10-CM

## 2017-04-17 NOTE — Procedures (Signed)
     HISTORY:  [name] is a 52 year old gentleman with a history of claudication type symptoms in both legs associated with walking.  The patient is being evaluated for possible neuropathy or radiculopathy that would explain his symptoms.  The symptoms are worse in the left leg than the right.  NERVE CONDUCTION STUDIES:  Nerve conduction studies were performed on both lower extremities. The distal motor latencies and motor amplitudes for the peroneal and posterior tibial nerves were within normal limits. The nerve conduction velocities for these nerves were also normal. The H reflex latencies were normal. The sensory latencies for the peroneal nerves were within normal limits.   EMG STUDIES:  EMG study was performed on the left lower extremity:  The tibialis anterior muscle reveals 2 to 4K motor units with full recruitment. No fibrillations or positive waves were seen. The peroneus tertius muscle reveals 2 to 4K motor units with full recruitment. No fibrillations or positive waves were seen. The medial gastrocnemius muscle reveals 1 to 3K motor units with full recruitment. No fibrillations or positive waves were seen. The vastus lateralis muscle reveals 2 to 4K motor units with full recruitment. No fibrillations or positive waves were seen. The iliopsoas muscle reveals 2 to 4K motor units with full recruitment. No fibrillations or positive waves were seen. The biceps femoris muscle (long head) reveals 2 to 4K motor units with full recruitment. No fibrillations or positive waves were seen. The lumbosacral paraspinal muscles were tested at 3 levels, and revealed no abnormalities of insertional activity at all 3 levels tested. There was good relaxation.   IMPRESSION:  Nerve conduction studies done on both lower extremities were within normal limits.  No evidence of a neuropathy is seen.  EMG evaluation of the left lower extremity was unremarkable without evidence of an overlying  lumbosacral radiculopathy.  [name]. [name] MD 04/17/2017 1:45 PM  [facility] [address] [place], [place] [id number]  Phone [phone] Fax [phone]

## 2017-04-17 NOTE — Progress Notes (Signed)
Please refer to EMG and nerve conduction study procedure note. 

## 2017-04-17 NOTE — Progress Notes (Addendum)
The patient is to be evaluated with a CT angiogram looking at the distal aorta and iliac vessels.     MNC    Nerve / Sites Muscle Latency Ref. Amplitude Ref. Rel Amp Segments Distance Velocity Ref. Area    ms ms mV mV %  cm m/s m/s mVms  L Peroneal - EDB     Ankle EDB 2.7 ?6.5 10.1 ?2.0 100 Ankle - EDB 9   31.5     Fib head EDB 10.6  8.6  84.6 Fib head - Ankle 36 45 ?44 28.1     Pop fossa EDB 12.4  8.6  101 Pop fossa - Fib head 10 53 ?44 28.5         Pop fossa - Ankle      R Peroneal - EDB     Ankle EDB 4.6 ?6.5 2.6 ?2.0 100 Ankle - EDB 9   8.9     Fib head EDB 12.1  2.4  90.6 Fib head - Ankle 36 48 ?44 8.4     Pop fossa EDB 14.0  2.0  84.4 Pop fossa - Fib head 10 53 ?44 6.8         Pop fossa - Ankle      L Tibial - AH     Ankle AH 3.3 ?5.8 9.6 ?4.0 100 Ankle - AH 9   20.4     Pop fossa AH 12.1  5.7  59.3 Pop fossa - Ankle 44 50 ?41 14.8  R Tibial - AH     Ankle AH 4.6 ?5.8 10.5 ?4.0 100 Ankle - AH 9   22.4     Pop fossa AH 13.0  10.1  96 Pop fossa - Ankle 44 52 ?41 21.8             SNC    Nerve / Sites Rec. Site Peak Lat Ref.  Amp Ref. Segments Distance    ms ms V V  cm  R Superficial peroneal - Ankle     Lat leg Ankle 3.1 ?4.4 18 ?6 Lat leg - Ankle 14  L Superficial peroneal - Ankle     Lat leg Ankle 2.9 ?4.4 13 ?6 Lat leg - Ankle 14         H Reflex    Nerve H Lat Lat Hmax   ms ms   Left Right Ref. Left Right Ref.  Tibial - Soleus 30.8 30.0 ?35.0 31.4 34.2 ?35.0         EMG

## 2017-04-23 ENCOUNTER — Encounter: Payer: Self-pay | Admitting: Neurology

## 2017-04-24 ENCOUNTER — Telehealth: Payer: Self-pay | Admitting: Neurology

## 2017-04-24 NOTE — Telephone Encounter (Signed)
I called the technician.  The MRI of the lumbar spine found what appeared to be thrombus within the wall of the aorta, we are mainly interested in looking at the infrarenal aorta and iliac vessels.

## 2017-04-24 NOTE — Telephone Encounter (Signed)
Marty/GI [phone] order for angiogram on abdomen and pelvis, she is wanting to make sure Dr Anne HahnWillis is not interested in seeing any other vessels in the whole leg past the hip. She said the patients hx reminds her of what vascular surgeons request as a runoff, if interested she can change it. Pt has appt on Friday 04/26/17. Please call to advise

## 2017-04-26 ENCOUNTER — Ambulatory Visit
Admission: RE | Admit: 2017-04-26 | Discharge: 2017-04-26 | Disposition: A | Discharge status: Home / Self Care | Payer: BLUE CROSS/BLUE SHIELD | Source: Ambulatory Visit | Attending: Neurology | Admitting: Neurology

## 2017-04-26 ENCOUNTER — Telehealth: Payer: Self-pay | Admitting: Neurology

## 2017-04-26 DIAGNOSIS — R0989 Other specified symptoms and signs involving the circulatory and respiratory systems: Secondary | ICD-10-CM

## 2017-04-26 DIAGNOSIS — I739 Peripheral vascular disease, unspecified: Secondary | ICD-10-CM

## 2017-04-26 MED ORDER — IOPAMIDOL (ISOVUE-300) INJECTION 61%: 75.0000 mL | Freq: Once | INTRAVENOUS | Status: DC | PRN

## 2017-04-26 MED ORDER — IOPAMIDOL (ISOVUE-370) INJECTION 76%
75.0000 mL | Freq: Once | INTRAVENOUS | Status: AC | PRN
  Administered 2017-04-26: 75 mL via INTRAVENOUS

## 2017-04-26 NOTE — Telephone Encounter (Signed)
  I called the patient.  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  I will refer the patient to vascular surgery for further evaluation.  This likely is the cause of the claudication symptoms.  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 Features related to collateralization suggest a chronic age favored over acute occlusion. This could certainly cause the patient's symptoms related to lower extremity pain and weakness.  NON-VASCULAR  Left nephrolithiasis.

## 2017-04-29 ENCOUNTER — Ambulatory Visit (INDEPENDENT_AMBULATORY_CARE_PROVIDER_SITE_OTHER): Payer: BLUE CROSS/BLUE SHIELD | Admitting: Vascular Surgery

## 2017-04-29 ENCOUNTER — Encounter (INDEPENDENT_AMBULATORY_CARE_PROVIDER_SITE_OTHER): Payer: Self-pay | Admitting: Vascular Surgery

## 2017-04-29 ENCOUNTER — Encounter (INDEPENDENT_AMBULATORY_CARE_PROVIDER_SITE_OTHER): Payer: BLUE CROSS/BLUE SHIELD | Admitting: Vascular Surgery

## 2017-04-29 DIAGNOSIS — I7409 Other arterial embolism and thrombosis of abdominal aorta: Secondary | ICD-10-CM | POA: Diagnosis not present

## 2017-04-29 DIAGNOSIS — M79604 Pain in right leg: Secondary | ICD-10-CM | POA: Diagnosis not present

## 2017-04-29 DIAGNOSIS — I70213 Atherosclerosis of native arteries of extremities with intermittent claudication, bilateral legs: Secondary | ICD-10-CM

## 2017-04-29 DIAGNOSIS — M79605 Pain in left leg: Secondary | ICD-10-CM

## 2017-04-30 ENCOUNTER — Encounter (INDEPENDENT_AMBULATORY_CARE_PROVIDER_SITE_OTHER): Payer: Self-pay | Admitting: Vascular Surgery

## 2017-04-30 DIAGNOSIS — I7409 Other arterial embolism and thrombosis of abdominal aorta: Secondary | ICD-10-CM | POA: Insufficient documentation

## 2017-04-30 DIAGNOSIS — M79606 Pain in leg, unspecified: Secondary | ICD-10-CM | POA: Insufficient documentation

## 2017-04-30 DIAGNOSIS — I739 Peripheral vascular disease, unspecified: Secondary | ICD-10-CM | POA: Insufficient documentation

## 2017-04-30 NOTE — Progress Notes (Signed)
MRN : [id number]  [name] is a 52 y.o. (1965-11-21) male who presents with chief complaint of  Chief Complaint  Patient presents with  . New Patient (Initial Visit)    Possible mural thrombosis/CT abdomen on 2/22  .  History of Present Illness:  The patient is seen for evaluation of painful lower extremities and diminished pulses. Patient notes the pain is always associated with activity and is very consistent day today. Typically, the pain occurs at less than one block, he noted that he would not be able to walk from the exam room to his car in the parking lot without stopping at least once.  The pain progresses as the activity continues to the point that the patient must stop walking. Resting including standing still for several minutes allowed resumption of the activity and the ability to walk a similar distance before stopping again. Uneven terrain and inclined shorten the distance. The pain has been progressive over the past several years. The patient states the inability to walk is now having a profound negative impact on quality of life and daily activities.  The patient was undergoing evaluation for low back pain associated with leg pain MRI demonstrated a mural thrombus within the aorta and subsequently a formal CT angiogram was obtained which demonstrated the distal aortic occlusion associated with bilateral common iliac occlusions.  There is reconstitution of the external iliac arteries bilaterally via the internal iliac arteries.    The patient also describes erectile dysfunction  The patient denies rest pain or dangling of an extremity off the side of the bed during the night for relief. No open wounds or sores at this time. No prior interventions or surgeries.  No history of back problems or DJD of the lumbar sacral spine.   The patient denies changes in claudication symptoms or new rest pain symptoms.  No new ulcers or wounds of the foot.  The patient's blood  pressure has been stable and relatively well controlled. The patient denies amaurosis fugax or recent TIA symptoms. There are no recent neurological changes noted. The patient denies history of DVT, PE or superficial thrombophlebitis. The patient denies recent episodes of angina or shortness of breath.   No outpatient medications have been marked as taking for the 04/29/17 encounter (Office Visit) with Gilda Crease, Latina Craver, MD.    Past Medical History:  Diagnosis Date  . Low back pain 04/04/2017  . Patient denies medical problems     Past Surgical History:  Procedure Laterality Date  . COLONOSCOPY WITH PROPOFOL N/A 03/27/2017   Procedure: COLONOSCOPY WITH PROPOFOL;  Surgeon: Earline Mayotte, MD;  Location: [facility] ENDOSCOPY;  Service: Endoscopy;  Laterality: N/A;  . KIDNEY STONE SURGERY    . KNEE SURGERY  2006    Social History Social History   Tobacco Use  . Smoking status: Former Smoker    Last attempt to quit: 03/31/2017    Years since quitting: 0.0  . Smokeless tobacco: Never Used  Substance Use Topics  . Alcohol use: No  . Drug use: No    Family History Family History  Problem Relation Age of Onset  . Diabetes Mother   . Heart disease Mother   . Alzheimer's disease Father   No family history of bleeding/clotting disorders, porphyria or autoimmune disease   No Known Allergies   REVIEW OF SYSTEMS (Negative unless checked)  Constitutional: [] Weight loss  [] Fever  [] Chills Cardiac: [] Chest pain   [] Chest pressure   [] Palpitations   [] Shortness  of breath when laying flat   [] Shortness of breath with exertion. Vascular:  [x] Pain in legs with walking   [] Pain in legs at rest  [] History of DVT   [] Phlebitis   [] Swelling in legs   [] Varicose veins   [] Non-healing ulcers Pulmonary:   [] Uses home oxygen   [] Productive cough   [] Hemoptysis   [] Wheeze  [] COPD   [] Asthma Neurologic:  [] Dizziness   [] Seizures   [] History of stroke   [] History of TIA  [] Aphasia   [] Vissual changes    [] Weakness or numbness in arm   [] Weakness or numbness in leg Musculoskeletal:   [] Joint swelling   [] Joint pain   [x] Low back pain Hematologic:  [] Easy bruising  [] Easy bleeding   [] Hypercoagulable state   [] Anemic Gastrointestinal:  [] Diarrhea   [] Vomiting  [] Gastroesophageal reflux/heartburn   [] Difficulty swallowing. Genitourinary:  [] Chronic kidney disease   [] Difficult urination  [] Frequent urination   [] Blood in urine Skin:  [] Rashes   [] Ulcers  Psychological:  [] History of anxiety   []  History of major depression.  Physical Examination  Vitals:   04/29/17 1405  BP: 108/70  Pulse: 77  Resp: 16  Weight: 158 lb (71.7 kg)  Height: 5\' 5"  (1.651 m)   Body mass index is 26.29 kg/m. Gen: WD/WN, NAD Head: [place]/AT, No temporalis wasting.  Ear/Nose/Throat: Hearing grossly intact, nares w/o erythema or drainage, poor dentition Eyes: PER, EOMI, sclera nonicteric.  Neck: Supple, no masses.  No bruit or JVD.  Pulmonary:  Good air movement, clear to auscultation bilaterally, no use of accessory muscles.  Cardiac: RRR, normal S1, S2, no Murmurs. Vascular:  Vessel Right Left  Radial Palpable Palpable  Femoral  not palpable  not palpable  Popliteal  not palpable  not palpable  PT  not palpable  not palpable  DP  not palpable  not palpable   Gastrointestinal: soft, non-distended. No guarding/no peritoneal signs.  Musculoskeletal: M/S 5/5 throughout.  No deformity or atrophy.  Neurologic: CN 2-12 intact. Pain and light touch intact in extremities.  Symmetrical.  Speech is fluent. Motor exam as listed above. Psychiatric: Judgment intact, Mood & affect appropriate for pt's clinical situation. Dermatologic: No rashes or ulcers noted.  No changes consistent with cellulitis. Lymph : No Cervical lymphadenopathy, no lichenification or skin changes of chronic lymphedema.  CBC No results found for: WBC, HGB, HCT, MCV, PLT  BMET    Component Value Date/Time   NA 141 04/11/2017 0912   K 4.2  04/11/2017 0912   CL 103 04/11/2017 0912   CO2 25 04/11/2017 0912   GLUCOSE 95 04/11/2017 0912   BUN 11 04/11/2017 0912   CREATININE 0.67 (L) 04/11/2017 0912   CALCIUM 9.7 04/11/2017 0912   GFRNONAA 110 04/11/2017 0912   GFRAA 128 04/11/2017 0912   Estimated Creatinine Clearance: 94 mL/min (A) (by C-G formula based on SCr of 0.67 mg/dL (L)).  COAG No results found for: INR, PROTIME  Radiology Mr Lumbar Spine Wo Contrast  Result Date: 04/09/2017 CLINICAL DATA:  Initial evaluation for low back pain with anterior bilateral leg and buttock pain. Bilateral leg weakness. EXAM: MRI LUMBAR SPINE WITHOUT CONTRAST TECHNIQUE: Multiplanar, multisequence MR imaging of the lumbar spine was performed. No intravenous contrast was administered. COMPARISON:  None available. FINDINGS: Segmentation: Normal segmentation. Lowest well-formed disc labeled the L5-S1 level. Alignment: Mild levoscoliosis. Vertebral bodies otherwise normally aligned with preservation of the normal lumbar lordosis. No listhesis. Vertebrae: Vertebral body heights well maintained without evidence for acute or chronic fracture.  Bone marrow signal intensity within normal limits. No discrete or worrisome osseous lesions. Mild reactive endplate changes with small Schmorl's nodes noted about the L1-2 interspace. Additional small degenerative endplate Schmorl's node noted at the inferior endplate of T11. No abnormal marrow edema. Conus medullaris and cauda equina: Conus extends to the L1-2 level. Conus and cauda equina appear normal. Paraspinal and other soft tissues: Paraspinous soft tissues demonstrate no acute abnormality. 15 mm T2 hyperintense simple cyst noted within the right kidney. Curvilinear density noted within the infrarenal aorta, possible reflecting mural thrombus (series 6, image 12). Thrombosed dissection could also have this appearance. Remainder of the visualized visceral structures otherwise within normal limits. Disc levels: L1-2:  Mild diffuse disc bulge with disc desiccation. No canal or neural foraminal stenosis. L2-3:  Unremarkable. L3-4: Normal interspace. Mild facet and ligamentum flavum hypertrophy. No canal or neural foraminal stenosis. L4-5: Mild diffuse disc bulge with disc desiccation. Small central annular fissure noted. Mild facet and ligamentum flavum hypertrophy. No significant canal or lateral recess stenosis. Foramina remain patent. L5-S1: Disc desiccation with mild posterior disc bulge. Associated annular fissure. Mild facet hypertrophy. No significant canal or lateral recess stenosis. Foramina remain patent. IMPRESSION: 1. Mild for age disc bulging at L1-2, L4-5, and L5-S1 without significant stenosis or neural impingement. 2. Mild facet bilateral facet hypertrophy at L3-4 through L5-S1. 3. Curvilinear density within the infrarenal aorta as above. While this finding may reflect mural thrombus due to underlying atheromatous disease, a possible thrombosed dissection could also have this appearance. Further evaluation with dedicated CTA of the abdomen and pelvis suggested for further characterization. These results will be called to the ordering clinician or representative by the Radiologist Assistant, and communication documented in the PACS or zVision Dashboard. Electronically Signed   By: Rise MuBenjamin  McClintock M.D.   On: 04/09/2017 21:34   Ct Angio Abd/pel W/ And/or W/o  Result Date: 04/26/2017 CLINICAL DATA:  Abnormal aorta noted on lumbar MRI. EXAM: CTA ABDOMEN AND PELVIS wITHOUT AND WITH CONTRAST TECHNIQUE: Multidetector CT imaging of the abdomen and pelvis was performed using the standard protocol during bolus administration of intravenous contrast. Multiplanar reconstructed images and MIPs were obtained and reviewed to evaluate the vascular anatomy. CONTRAST:  75mL ISOVUE-370 IOPAMIDOL (ISOVUE-370) INJECTION 76% COMPARISON:  Lumbar MRI 04/09/2017 FINDINGS: VASCULAR Aorta: Lower thoracic aorta, upper abdominal aorta,  and juxtarenal aorta are patent and nonaneurysmal. There is smooth plaque causing 50% narrowing in the lower abdominal aorta. Just below the takeoff of the IMA, the aorta is occluded. There is hypertrophy of the IMA branches as well as the inferior epigastric arteries Celiac: Patent. SMA: Patent. Renals: Single renal arteries are patent. IMA: Patent. There is hypertrophy of the IMA allowing for collateralization of the lower extremity vascular supply. Inflow: Right common iliac artery is occluded. It reconstitutes just above the bifurcation. Right internal and external iliac arteries are patent. Left common iliac artery is occluded. It reconstitutes just above the bifurcation. Left internal and external iliac arteries are patent. Proximal Outflow: Grossly patent. Veins: No evidence of DVT. Review of the MIP images confirms the above findings. NON-VASCULAR Lower chest: Scattered subsegmental atelectasis. Hepatobiliary: Wedge-shaped enhancement on arterial phase images in the posterior right lobe likely represents a vascular phenomenon. Diffuse hepatic steatosis is noted. Normal gallbladder. Pancreas: Unremarkable Spleen: Several lobulated cysts. Adrenals/Urinary Tract: 4 mm calculus in the left upper pole. Small simple cyst in the right kidney. Adrenal glands are unremarkable. Bladder is within normal limits. Stomach/Bowel: Stomach and duodenum are unremarkable. There  is no evidence of small-bowel obstruction. Normal appendix. No obvious mass in the colon moderate stool burden throughout the colon. Lymphatic: No abnormal retroperitoneal adenopathy. Reproductive: Prostate is enlarged. Other: No free fluid. Musculoskeletal: No vertebral compression deformity. IMPRESSION: VASCULAR There is occlusion of the distal aorta and common iliac arteries as described. Features related to collateralization suggest a chronic age favored over acute occlusion. This could certainly cause the patient's symptoms related to lower  extremity pain and weakness. NON-VASCULAR Left nephrolithiasis. Electronically Signed   By: Jolaine Click M.D.   On: 04/26/2017 12:55     Assessment/Plan 1. Atherosclerosis of native artery of both lower extremities with intermittent claudication (HCC)  Recommend:  The patient has evidence of severe atherosclerotic changes of both lower extremities associated with distal aortic occlusion and lifestyle limiting claudication.  He is still working and is concerned that his inability to ambulate could cost him his job.  CT Angiography has been performed and the situation is not ideal for intervention.  He has distal aortic occlusion with extensive mural thrombus in association with bilateral common iliac artery occlusion.  There is reconstitution of both external iliac arteries which appear to have minimal disease.  Given this finding open surgical repair is recommended, the patient should undergo aortobiiliac bypass grafting.  Traditional surgical reconstruction is also recommended based on his young age and lack of any other significant cardiopulmonary disease.  Patient should undergo arterial reconstruction of the lower extremity.  The risks and benefits as well as the alternative therapies was discussed in detail with the patient.  Alternative therapies including intervention with thrombolysis overnight and the risks of bleeding were discussed in great detail.  All questions were answered.  Patient wishes to consider his options and will get back to me.  The patient will follow up with me in the office after the procedure.    A total of 70 minutes was spent with this patient and greater than 50% was spent in counseling and coordination of care with the patient.  Discussion included the treatment options for vascular disease including indications for surgery and intervention.  Also discussed is the appropriate timing of treatment.  In addition medical therapy was discussed.   2. Chronic distal  aortic occlusion (HCC) See #1  3. Pain in both lower extremities I believe this is secondary to his aortic occlusion and given the severity of his claudication symptoms and his significant limitations have recommended traditional aortobiiliac bypass grafting.  Again, the patient will consider this he is concerned about the amount of time he would be away from work and losing his job.    Levora Dredge, MD  04/30/2017 8:57 AM

## 2017-05-07 ENCOUNTER — Encounter (INDEPENDENT_AMBULATORY_CARE_PROVIDER_SITE_OTHER): Payer: BLUE CROSS/BLUE SHIELD | Admitting: Vascular Surgery

## 2017-05-08 ENCOUNTER — Telehealth: Payer: Self-pay | Admitting: Cardiovascular Disease

## 2017-05-08 ENCOUNTER — Other Ambulatory Visit: Payer: Self-pay

## 2017-05-08 DIAGNOSIS — Z01818 Encounter for other preprocedural examination: Secondary | ICD-10-CM

## 2017-05-08 NOTE — Telephone Encounter (Signed)
[name], Douglas A, MD  [name], Douglas Seevers H, RN        Dr. Dario GuardianJadali talked with me about this patient. He needs preop for vascular surgery. He is scheduled to see me on Tuesday. I want him to have [name] Lexiscan Myoview (for preop) before his visit with me.    Order placed. Left message on pt's cell VM to contact the office.

## 2017-05-08 NOTE — Telephone Encounter (Signed)
Per Dr. Kirke CorinArida, may add patient at 1:20pm on 3/12. Left detailed message on pt's VM.

## 2017-05-08 NOTE — Telephone Encounter (Signed)
Patient returned call. Reviewed/explained lexi myoview testing. Patient verbalized understanding and is agreeable to March 7, 7:15am. He asks if he could see Dr. Kirke CorinArida on Tuesday at an earlier time as he works second shift and goes to work at [employer]. I have sent a message to MD.

## 2017-05-09 ENCOUNTER — Encounter
Admission: RE | Admit: 2017-05-09 | Discharge: 2017-05-09 | Disposition: A | Discharge status: Home / Self Care | Payer: BLUE CROSS/BLUE SHIELD | Source: Ambulatory Visit | Attending: Cardiovascular Disease | Admitting: Cardiovascular Disease

## 2017-05-09 DIAGNOSIS — Z01818 Encounter for other preprocedural examination: Secondary | ICD-10-CM | POA: Insufficient documentation

## 2017-05-09 DIAGNOSIS — I2510 Atherosclerotic heart disease of native coronary artery without angina pectoris: Secondary | ICD-10-CM

## 2017-05-09 LAB — NM MYOCAR MULTI W/SPECT W/WALL MOTION / EF
CHL CUP RESTING HR STRESS: 67 {beats}/min
CSEPPHR: 96 {beats}/min
LVDIAVOL: 64 mL (ref 62–150)
LVSYSVOL: 22 mL
Percent HR: 57 %
SDS: 1
SRS: 1
SSS: 2
TID: 1.07

## 2017-05-09 MED ORDER — TECHNETIUM TC 99M TETROFOSMIN IV KIT
10.0000 | PACK | Freq: Once | INTRAVENOUS | Status: AC | PRN
  Administered 2017-05-09: 13.27 via INTRAVENOUS

## 2017-05-09 MED ORDER — REGADENOSON 0.4 MG/5ML IV SOLN
0.4000 mg | Freq: Once | INTRAVENOUS | Status: AC
Start: 2017-05-09 — End: 2017-05-09
  Administered 2017-05-09: 0.4 mg via INTRAVENOUS

## 2017-05-09 MED ORDER — TECHNETIUM TC 99M TETROFOSMIN IV KIT
30.0000 | PACK | Freq: Once | INTRAVENOUS | Status: AC | PRN
  Administered 2017-05-09: 30.92 via INTRAVENOUS

## 2017-05-10 ENCOUNTER — Encounter: Payer: BLUE CROSS/BLUE SHIELD | Admitting: Vascular Surgery

## 2017-05-14 ENCOUNTER — Ambulatory Visit: Payer: BLUE CROSS/BLUE SHIELD | Admitting: Neurology

## 2017-05-14 ENCOUNTER — Encounter: Payer: Self-pay | Admitting: Cardiovascular Disease

## 2017-05-14 ENCOUNTER — Ambulatory Visit: Payer: BLUE CROSS/BLUE SHIELD | Admitting: Cardiovascular Disease

## 2017-05-14 ENCOUNTER — Ambulatory Visit (INDEPENDENT_AMBULATORY_CARE_PROVIDER_SITE_OTHER): Payer: BLUE CROSS/BLUE SHIELD | Admitting: Cardiovascular Disease

## 2017-05-14 VITALS — BP 102/58 | HR 75 | Ht 65.0 in | Wt 163.5 lb

## 2017-05-14 DIAGNOSIS — Z7689 Persons encountering health services in other specified circumstances: Secondary | ICD-10-CM

## 2017-05-14 DIAGNOSIS — Z01810 Encounter for preprocedural cardiovascular examination: Secondary | ICD-10-CM

## 2017-05-14 DIAGNOSIS — I739 Peripheral vascular disease, unspecified: Secondary | ICD-10-CM

## 2017-05-14 DIAGNOSIS — E785 Hyperlipidemia, unspecified: Secondary | ICD-10-CM | POA: Diagnosis not present

## 2017-05-14 NOTE — Progress Notes (Signed)
Cardiology Office Note   Date:  05/14/2017   ID:  [name], DOB 1965-05-23, MRN [id number]  PCP:  Sherrie Mustache, MD  Cardiologist:   Lorine Bears, MD   Chief Complaint  Patient presents with  . other    Ref by Dr. Dario Guardian for surgical clearance for claudication & for traditional aortobiiliac bypass grafting; not scheduled yet. Meds reviewed by the pt. verbally. Denies chest pain or shortness of breath.       History of Present Illness: [name] is a 52 y.o. male who was referred by Dr. Dario Guardian for preoperative cardiovascular evaluation before aortobifemoral bypass.  The patient has no prior cardiac history and has no significant chronic medical conditions other than previous tobacco use and possibly hyperlipidemia.  He quit smoking in January.  He has no family history of premature coronary artery disease.  The patient was seen recently for bilateral lower back and leg pain  with minimal walking.  CT scan showed evidence of abdominal aortic occlusion below the renal arteries.  The occlusion extended into the iliac arteries.  The patient was seen by Dr. Gilda Crease who recommended aortobifemoral bypass. The patient denies any chest pain or significant dyspnea.  However, his functional capacity is limited by his claudication. We proceeded with a pharmacologic nuclear stress test last week which showed no evidence of ischemia with normal ejection fraction.   Past Medical History:  Diagnosis Date  . Low back pain 04/04/2017  . Patient denies medical problems     Past Surgical History:  Procedure Laterality Date  . COLONOSCOPY WITH PROPOFOL N/A 03/27/2017   Procedure: COLONOSCOPY WITH PROPOFOL;  Surgeon: Earline Mayotte, MD;  Location: [facility] ENDOSCOPY;  Service: Endoscopy;  Laterality: N/A;  . KIDNEY STONE SURGERY    . KNEE SURGERY  2006     No current outpatient medications on file.   No current facility-administered medications for this visit.      Allergies:   Patient has no known allergies.    Social History:  The patient  reports that he quit smoking about 6 weeks ago. His smoking use included cigarettes. He has a 7.50 pack-year smoking history. he has never used smokeless tobacco. He reports that he does not drink alcohol or use drugs.   Family History:  The patient's family history includes Alzheimer's disease in his father; Diabetes in his mother; Heart disease in his mother.    ROS:  Please see the history of present illness.   Otherwise, review of systems are positive for none.   All other systems are reviewed and negative.    PHYSICAL EXAM: VS:  BP (!) 102/58 (BP Location: Right Arm, Patient Position: Sitting, Cuff Size: Normal)   Ht 5\' 5"  (1.651 m)   Wt 163 lb 8 oz (74.2 kg)   BMI 27.21 kg/m  , BMI Body mass index is 27.21 kg/m. GEN: Well nourished, well developed, in no acute distress  HEENT: normal  Neck: no JVD, carotid bruits, or masses Cardiac: RRR; no murmurs, rubs, or gallops,no edema  Respiratory:  clear to auscultation bilaterally, normal work of breathing GI: soft, nontender, nondistended, + BS MS: no deformity or atrophy  Skin: warm and dry, no rash Neuro:  Strength and sensation are intact Psych: euthymic mood, full affect   EKG:  EKG is ordered today. The ekg ordered today demonstrates normal sinus rhythm with no significant ST or T wave changes.   Recent Labs: 04/11/2017: ALT 51; BUN 11; Creatinine, Ser 0.67; Potassium  4.2; Sodium 141    Lipid Panel No results found for: CHOL, TRIG, HDL, CHOLHDL, VLDL, LDLCALC, LDLDIRECT    Wt Readings from Last 3 Encounters:  05/14/17 163 lb 8 oz (74.2 kg)  04/29/17 158 lb (71.7 kg)  04/04/17 160 lb (72.6 kg)       PAD Screen 05/14/2017  Previous PAD dx? No  Previous surgical procedure? No  Pain with walking? Yes  Subsides with rest? Yes  Feet/toe relief with dangling? No  Painful, non-healing ulcers? No  Extremities discolored? No       ASSESSMENT AND PLAN:  1.  Preoperative cardiovascular evaluation for aortobifemoral bypass.  The patient has no prior cardiac history and currently with no anginal symptoms.  His functional capacity is reduced due to claudication.  Baseline EKG does not show any ischemic changes.  Nuclear stress test was low risk.  Based on all of this, the patient is considered at low risk for cardiovascular complications.  2.  Peripheral arterial disease with abdominal aortic occlusion.  Consider adding low-dose aspirin.  3.  Hyperlipidemia: Given the presence of peripheral arterial disease, recommend treatment with a statin.  I do not have the results of his labs and I am going to forward this to Dr. Dario GuardianJadali    Disposition:   FU with me as needed.  Signed,  Lorine BearsMuhammad Romuald Mccaslin, MD  05/14/2017 1:16 PM    [facility]

## 2017-05-14 NOTE — Patient Instructions (Signed)
Medication Instructions:  Your physician recommends that you continue on your current medications as directed. Please refer to the Current Medication list given to you today.   Labwork: none  Testing/Procedures: none  Follow-Up: Your physician recommends that you schedule a follow-up appointment as needed.    Any Other Special Instructions Will Be Listed Below (If Applicable).     If you need a refill on your cardiac medications before your next appointment, please call your pharmacy.   

## 2017-05-16 ENCOUNTER — Encounter (INDEPENDENT_AMBULATORY_CARE_PROVIDER_SITE_OTHER): Payer: Self-pay | Admitting: Vascular Surgery

## 2017-05-16 ENCOUNTER — Ambulatory Visit (INDEPENDENT_AMBULATORY_CARE_PROVIDER_SITE_OTHER): Payer: BLUE CROSS/BLUE SHIELD | Admitting: Vascular Surgery

## 2017-05-16 VITALS — BP 101/65 | HR 78 | Resp 18 | Wt 164.0 lb

## 2017-05-16 DIAGNOSIS — M79605 Pain in left leg: Secondary | ICD-10-CM | POA: Diagnosis not present

## 2017-05-16 DIAGNOSIS — I70223 Atherosclerosis of native arteries of extremities with rest pain, bilateral legs: Secondary | ICD-10-CM | POA: Diagnosis not present

## 2017-05-16 DIAGNOSIS — I7409 Other arterial embolism and thrombosis of abdominal aorta: Secondary | ICD-10-CM

## 2017-05-16 DIAGNOSIS — M79604 Pain in right leg: Secondary | ICD-10-CM

## 2017-05-17 ENCOUNTER — Encounter (INDEPENDENT_AMBULATORY_CARE_PROVIDER_SITE_OTHER): Payer: Self-pay

## 2017-05-17 ENCOUNTER — Encounter (INDEPENDENT_AMBULATORY_CARE_PROVIDER_SITE_OTHER): Payer: Self-pay | Admitting: Vascular Surgery

## 2017-05-17 NOTE — Progress Notes (Signed)
MRN : [id number]  [name] is a 52 y.o. (Dec 21, 1965) male who presents with chief complaint of  Chief Complaint  Patient presents with  . Follow-up    patient here for clearnace  .  History of Present Illness: The patient returns to the office for followup and discussion regarding surgery. There has been a significant deterioration in the lower extremity symptoms.  The patient notes interval shortening of their claudication distance and development of mild rest pain symptoms. No new ulcers or wounds have occurred since the last visit.  There have been no significant changes to the patient's overall health care.  The patient denies amaurosis fugax or recent TIA symptoms. There are no recent neurological changes noted. The patient denies history of DVT, PE or superficial thrombophlebitis. The patient denies recent episodes of angina or shortness of breath.     No outpatient medications have been marked as taking for the 05/16/17 encounter (Office Visit) with Gilda Crease, Latina Craver, MD.    Past Medical History:  Diagnosis Date  . Low back pain 04/04/2017  . Patient denies medical problems     Past Surgical History:  Procedure Laterality Date  . COLONOSCOPY WITH PROPOFOL N/A 03/27/2017   Procedure: COLONOSCOPY WITH PROPOFOL;  Surgeon: Earline Mayotte, MD;  Location: [facility] ENDOSCOPY;  Service: Endoscopy;  Laterality: N/A;  . KIDNEY STONE SURGERY    . KNEE SURGERY  2006    Social History Social History   Tobacco Use  . Smoking status: Former Smoker    Packs/day: 0.25    Years: 30.00    Pack years: 7.50    Types: Cigarettes    Last attempt to quit: 03/31/2017    Years since quitting: 0.1  . Smokeless tobacco: Never Used  Substance Use Topics  . Alcohol use: No  . Drug use: No    Family History Family History  Problem Relation Age of Onset  . Diabetes Mother   . Heart disease Mother   . Alzheimer's disease Father     No Known Allergies   REVIEW OF  SYSTEMS (Negative unless checked)  Constitutional: [] Weight loss  [] Fever  [] Chills Cardiac: [] Chest pain   [] Chest pressure   [] Palpitations   [] Shortness of breath when laying flat   [] Shortness of breath with exertion. Vascular:  [x] Pain in legs with walking   [x] Pain in legs at rest  [] History of DVT   [] Phlebitis   [] Swelling in legs   [] Varicose veins   [] Non-healing ulcers Pulmonary:   [] Uses home oxygen   [] Productive cough   [] Hemoptysis   [] Wheeze  [] COPD   [] Asthma Neurologic:  [] Dizziness   [] Seizures   [] History of stroke   [] History of TIA  [] Aphasia   [] Vissual changes   [] Weakness or numbness in arm   [] Weakness or numbness in leg Musculoskeletal:   [] Joint swelling   [] Joint pain   [] Low back pain Hematologic:  [] Easy bruising  [] Easy bleeding   [] Hypercoagulable state   [] Anemic Gastrointestinal:  [] Diarrhea   [] Vomiting  [] Gastroesophageal reflux/heartburn   [] Difficulty swallowing. Genitourinary:  [] Chronic kidney disease   [] Difficult urination  [] Frequent urination   [] Blood in urine Skin:  [] Rashes   [] Ulcers  Psychological:  [] History of anxiety   []  History of major depression.  Physical Examination  Vitals:   05/16/17 1328  BP: 101/65  Pulse: 78  Resp: 18  Weight: 164 lb (74.4 kg)   Body mass index is 27.29 kg/m. Gen: WD/WN, NAD Head: [place]/AT,  No temporalis wasting.  Ear/Nose/Throat: Hearing grossly intact, nares w/o erythema or drainage Eyes: PER, EOMI, sclera nonicteric.  Neck: Supple, no large masses.   Pulmonary:  Good air movement, no audible wheezing bilaterally, no use of accessory muscles.  Cardiac: RRR, no JVD Vascular:  Vessel Right Left  Radial Palpable Palpable  Femoral Not Palpable Not Palpable  Popliteal Not Palpable Not Palpable  PT Not Palpable Not Palpable  DP Not Palpable Not Palpable  Gastrointestinal: Non-distended. No guarding/no peritoneal signs.  Musculoskeletal: M/S 5/5 throughout.  No deformity or atrophy.  Neurologic: CN 2-12  intact. Symmetrical.  Speech is fluent. Motor exam as listed above. Psychiatric: Judgment intact, Mood & affect appropriate for pt's clinical situation. Dermatologic: No rashes or ulcers noted.  No changes consistent with cellulitis. Lymph : No lichenification or skin changes of chronic lymphedema.  CBC No results found for: WBC, HGB, HCT, MCV, PLT  BMET    Component Value Date/Time   NA 141 04/11/2017 0912   K 4.2 04/11/2017 0912   CL 103 04/11/2017 0912   CO2 25 04/11/2017 0912   GLUCOSE 95 04/11/2017 0912   BUN 11 04/11/2017 0912   CREATININE 0.67 (L) 04/11/2017 0912   CALCIUM 9.7 04/11/2017 0912   GFRNONAA 110 04/11/2017 0912   GFRAA 128 04/11/2017 0912   CrCl cannot be calculated (Patient's most recent lab result is older than the maximum 21 days allowed.).  COAG No results found for: INR, PROTIME  Radiology Nm Myocar Multi W/spect W/wall Motion / Ef  Result Date: 05/09/2017  There was no ST segment deviation noted during stress.  No T wave inversion was noted during stress.  The study is normal.  This is a low risk study.  The left ventricular ejection fraction is normal (55-65%).    Ct Angio Abd/pel W/ And/or W/o  Result Date: 04/26/2017 CLINICAL DATA:  Abnormal aorta noted on lumbar MRI. EXAM: CTA ABDOMEN AND PELVIS wITHOUT AND WITH CONTRAST TECHNIQUE: Multidetector CT imaging of the abdomen and pelvis was performed using the standard protocol during bolus administration of intravenous contrast. Multiplanar reconstructed images and MIPs were obtained and reviewed to evaluate the vascular anatomy. CONTRAST:  75mL ISOVUE-370 IOPAMIDOL (ISOVUE-370) INJECTION 76% COMPARISON:  Lumbar MRI 04/09/2017 FINDINGS: VASCULAR Aorta: Lower thoracic aorta, upper abdominal aorta, and juxtarenal aorta are patent and nonaneurysmal. There is smooth plaque causing 50% narrowing in the lower abdominal aorta. Just below the takeoff of the IMA, the aorta is occluded. There is hypertrophy of the  IMA branches as well as the inferior epigastric arteries Celiac: Patent. SMA: Patent. Renals: Single renal arteries are patent. IMA: Patent. There is hypertrophy of the IMA allowing for collateralization of the lower extremity vascular supply. Inflow: Right common iliac artery is occluded. It reconstitutes just above the bifurcation. Right internal and external iliac arteries are patent. Left common iliac artery is occluded. It reconstitutes just above the bifurcation. Left internal and external iliac arteries are patent. Proximal Outflow: Grossly patent. Veins: No evidence of DVT. Review of the MIP images confirms the above findings. NON-VASCULAR Lower chest: Scattered subsegmental atelectasis. Hepatobiliary: Wedge-shaped enhancement on arterial phase images in the posterior right lobe likely represents a vascular phenomenon. Diffuse hepatic steatosis is noted. Normal gallbladder. Pancreas: Unremarkable Spleen: Several lobulated cysts. Adrenals/Urinary Tract: 4 mm calculus in the left upper pole. Small simple cyst in the right kidney. Adrenal glands are unremarkable. Bladder is within normal limits. Stomach/Bowel: Stomach and duodenum are unremarkable. There is no evidence of small-bowel obstruction. Normal  appendix. No obvious mass in the colon moderate stool burden throughout the colon. Lymphatic: No abnormal retroperitoneal adenopathy. Reproductive: Prostate is enlarged. Other: No free fluid. Musculoskeletal: No vertebral compression deformity. IMPRESSION: VASCULAR There is occlusion of the distal aorta and common iliac arteries as described. Features related to collateralization suggest a chronic age favored over acute occlusion. This could certainly cause the patient's symptoms related to lower extremity pain and weakness. NON-VASCULAR Left nephrolithiasis. Electronically Signed   By: Jolaine Click M.D.   On: 04/26/2017 12:55     Assessment/Plan 1. 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  This represents a limb threatening ischemia and places the patient at the risk for limb loss.  Angiography has been performed and the situation is not ideal for intervention.  Given this finding open surgical repair is recommended.  Aorta bi iliac or bi femoral is recommended.  Patient should undergo arterial reconstruction of the lower extremity with the hope for limb salvage.  The risks and benefits as well as the alternative therapies was discussed in detail with the patient.  All questions were answered.  Patient agrees to proceed with bypass surgery.  The patient will follow up with me in the office after the procedure.    A total of 35 minutes was spent with this patient and greater than 50% was spent in counseling and coordination of care with the patient.  Discussion included the treatment options for vascular disease including indications for surgery and intervention.  Also discussed is the appropriate timing of treatment.  In addition medical therapy was discussed.  2. Chronic distal aortic occlusion (HCC) See #1  3. Pain in both lower extremities See #1    Levora Dredge, MD  05/17/2017 9:52 AM

## 2017-05-20 ENCOUNTER — Other Ambulatory Visit (INDEPENDENT_AMBULATORY_CARE_PROVIDER_SITE_OTHER): Payer: Self-pay | Admitting: Vascular Surgery

## 2017-05-21 ENCOUNTER — Encounter
Admission: RE | Admit: 2017-05-21 | Discharge: 2017-05-21 | Disposition: A | Discharge status: Home / Self Care | Payer: BLUE CROSS/BLUE SHIELD | Source: Ambulatory Visit | Attending: Vascular Surgery | Admitting: Vascular Surgery

## 2017-05-21 ENCOUNTER — Other Ambulatory Visit: Payer: Self-pay

## 2017-05-21 DIAGNOSIS — I739 Peripheral vascular disease, unspecified: Secondary | ICD-10-CM | POA: Insufficient documentation

## 2017-05-21 DIAGNOSIS — Z01810 Encounter for preprocedural cardiovascular examination: Secondary | ICD-10-CM

## 2017-05-21 DIAGNOSIS — Z01812 Encounter for preprocedural laboratory examination: Secondary | ICD-10-CM

## 2017-05-21 HISTORY — DX: Pain in leg, unspecified: M79.606

## 2017-05-21 LAB — CBC WITH DIFFERENTIAL/PLATELET
BASOS ABS: 0 10*3/uL (ref 0–0.1)
BASOS PCT: 0 %
Eosinophils Absolute: 0.1 10*3/uL (ref 0–0.7)
Eosinophils Relative: 2 %
HCT: 44.8 % (ref 40.0–52.0)
Hemoglobin: 15.4 g/dL (ref 13.0–18.0)
Lymphocytes Relative: 33 %
Lymphs Abs: 1.6 10*3/uL (ref 1.0–3.6)
MCH: 29.4 pg (ref 26.0–34.0)
MCHC: 34.4 g/dL (ref 32.0–36.0)
MCV: 85.3 fL (ref 80.0–100.0)
MONO ABS: 0.6 10*3/uL (ref 0.2–1.0)
Monocytes Relative: 13 %
NEUTROS ABS: 2.4 10*3/uL (ref 1.4–6.5)
Neutrophils Relative %: 52 %
PLATELETS: 222 10*3/uL (ref 150–440)
RBC: 5.25 MIL/uL (ref 4.40–5.90)
RDW: 14.8 % — AB (ref 11.5–14.5)
WBC: 4.7 10*3/uL (ref 3.8–10.6)

## 2017-05-21 LAB — BASIC METABOLIC PANEL
ANION GAP: 10 (ref 5–15)
BUN: 14 mg/dL (ref 6–20)
CALCIUM: 9.7 mg/dL (ref 8.9–10.3)
CO2: 26 mmol/L (ref 22–32)
Chloride: 106 mmol/L (ref 101–111)
Creatinine, Ser: 0.65 mg/dL (ref 0.61–1.24)
GLUCOSE: 74 mg/dL (ref 65–99)
POTASSIUM: 4.7 mmol/L (ref 3.5–5.1)
SODIUM: 142 mmol/L (ref 135–145)

## 2017-05-21 LAB — SURGICAL PCR SCREEN
MRSA, PCR: NEGATIVE
Staphylococcus aureus: NEGATIVE

## 2017-05-21 LAB — PROTIME-INR
INR: 1.04
Prothrombin Time: 13.5 seconds (ref 11.4–15.2)

## 2017-05-21 LAB — TYPE AND SCREEN
ABO/RH(D): A POS
ANTIBODY SCREEN: NEGATIVE

## 2017-05-21 LAB — APTT: APTT: 31 s (ref 24–36)

## 2017-05-21 MED ORDER — CHLORHEXIDINE GLUCONATE CLOTH 2 % EX PADS
6.0000 | MEDICATED_PAD | Freq: Once | CUTANEOUS | Status: DC
  Filled 2017-05-21: qty 6

## 2017-05-21 MED ORDER — CEFAZOLIN SODIUM-DEXTROSE 2-4 GM/100ML-% IV SOLN
2.0000 g | INTRAVENOUS | Status: AC
  Administered 2017-05-22: 2 g via INTRAVENOUS
  Administered 2017-05-22: 1 g via INTRAVENOUS

## 2017-05-21 NOTE — Pre-Procedure Instructions (Addendum)
EKG on 05/14/17 same as EKG done 05/21/17 see Dr Kirke CorinArida interpretation.  Study Result    There was no ST segment deviation noted during stress.  No T wave inversion was noted during stress.  The study is normal.  This is a low risk study.  The left ventricular ejection fraction is normal (55-65%).

## 2017-05-21 NOTE — Pre-Procedure Instructions (Signed)
CARDIAC CLEARANCE on chart     Iran OuchArida, Muhammad A, MD  Cardiology   Preop cardiovascular exam +3 more  Dx   other   ; Referred by Sherrie MustacheJadali, Fayegh, MD  Reason for Visit   Additional Documentation   Vitals:   BP 102/58 Abnormal  (BP Location: Right Arm, Patient Position: Sitting, Cuff Size: Normal)   Pulse 75   Ht 5\' 5"  (1.651 m)   Wt 163 lb 8 oz (74.2 kg)   BMI 27.21 kg/m   BSA 1.84 m      More Vitals   Flowsheets:   Peripheral Arterial Disease Screen,   Anthropometrics,   MEWS Score     Encounter Info:   Billing Info,   History,   Allergies,   Detailed Report     All Notes   Procedures by Iran OuchArida, Muhammad A, MD at 05/14/2017 1:20 PM   Author: Iran OuchArida, Muhammad A, MD Author Type: Physician Filed: 05/15/2017 2:31 PM  Note Status: Signed Cosign: Cosign Not Required Encounter Date: 05/14/2017  Editor: Charlynn GrimesStroud, Hiraa M        Scan on 05/15/2017 2:31 PM by Andria RheinStroud, Hiraa M: [facility] E[facility] [place]  Progress Notes by Iran OuchArida, Muhammad A, MD at 05/14/2017 1:20 PM   Author: Iran OuchArida, Muhammad A, MD Author Type: Physician Filed: 05/14/2017 6:06 PM  Note Status: Signed Cosign: Cosign Not Required Encounter Date: 05/14/2017  Editor: Iran OuchArida, Muhammad A, MD (Physician)  Expand All Collapse All       Cardiology Office Note   Date:  05/14/2017   ID:  [name], DOB [birth year], MRN [id number]  PCP:  Sherrie MustacheJadali, Fayegh, MD       Cardiologist:   Lorine BearsMuhammad Arida, MD       Chief Complaint  Patient presents with  . other    Ref by Dr. Dario GuardianJadali for surgical clearance for claudication & for traditional aortobiiliac bypass grafting; not scheduled yet. Meds reviewed by the pt. verbally. Denies chest pain or shortness of breath.       History of Present Illness: [name] is a 52 y.o. male who was referred by Dr. Dario GuardianJadali for preoperative cardiovascular evaluation before aortobifemoral bypass.  The patient has no prior cardiac history and has no significant  chronic medical conditions other than previous tobacco use and possibly hyperlipidemia.  He quit smoking in January.  He has no family history of premature coronary artery disease.  The patient was seen recently for bilateral lower back and leg pain  with minimal walking.  CT scan showed evidence of abdominal aortic occlusion below the renal arteries.  The occlusion extended into the iliac arteries.  The patient was seen by Dr. Gilda CreaseSchnier who recommended aortobifemoral bypass. The patient denies any chest pain or significant dyspnea.  However, his functional capacity is limited by his claudication. We proceeded with a pharmacologic nuclear stress test last week which showed no evidence of ischemia with normal ejection fraction.       Past Medical History:  Diagnosis Date  . Low back pain 04/04/2017  . Patient denies medical problems          Past Surgical History:  Procedure Laterality Date  . COLONOSCOPY WITH PROPOFOL N/A 03/27/2017   Procedure: COLONOSCOPY WITH PROPOFOL;  Surgeon: Earline MayotteByrnett, Jeffrey W, MD;  Location: [facility] ENDOSCOPY;  Service: Endoscopy;  Laterality: N/A;  . KIDNEY STONE SURGERY    . KNEE SURGERY  2006     No current outpatient medications on file.   No current facility-administered medications for  this visit.     Allergies:   Patient has no known allergies.    Social History:  The patient  reports that he quit smoking about 6 weeks ago. His smoking use included cigarettes. He has a 7.50 pack-year smoking history. he has never used smokeless tobacco. He reports that he does not drink alcohol or use drugs.   Family History:  The patient's family history includes Alzheimer's disease in his father; Diabetes in his mother; Heart disease in his mother.    ROS:  Please see the history of present illness.   Otherwise, review of systems are positive for none.   All other systems are reviewed and negative.    PHYSICAL EXAM: VS:  BP (!) 102/58 (BP  Location: Right Arm, Patient Position: Sitting, Cuff Size: Normal)   Ht 5\' 5"  (1.651 m)   Wt 163 lb 8 oz (74.2 kg)   BMI 27.21 kg/m  , BMI Body mass index is 27.21 kg/m. GEN: Well nourished, well developed, in no acute distress  HEENT: normal  Neck: no JVD, carotid bruits, or masses Cardiac: RRR; no murmurs, rubs, or gallops,no edema  Respiratory:  clear to auscultation bilaterally, normal work of breathing GI: soft, nontender, nondistended, + BS MS: no deformity or atrophy  Skin: warm and dry, no rash Neuro:  Strength and sensation are intact Psych: euthymic mood, full affect   EKG:  EKG is ordered today. The ekg ordered today demonstrates normal sinus rhythm with no significant ST or T wave changes.   Recent Labs: 04/11/2017: ALT 51; BUN 11; Creatinine, Ser 0.67; Potassium 4.2; Sodium 141    Lipid Panel Labs (Brief)  No results found for: CHOL, TRIG, HDL, CHOLHDL, VLDL, LDLCALC, LDLDIRECT         Wt Readings from Last 3 Encounters:  05/14/17 163 lb 8 oz (74.2 kg)  04/29/17 158 lb (71.7 kg)  04/04/17 160 lb (72.6 kg)       PAD Screen 05/14/2017  Previous PAD dx? No  Previous surgical procedure? No  Pain with walking? Yes  Subsides with rest? Yes  Feet/toe relief with dangling? No  Painful, non-healing ulcers? No  Extremities discolored? No      ASSESSMENT AND PLAN:  1.  Preoperative cardiovascular evaluation for aortobifemoral bypass.  The patient has no prior cardiac history and currently with no anginal symptoms.  His functional capacity is reduced due to claudication.  Baseline EKG does not show any ischemic changes.  Nuclear stress test was low risk.  Based on all of this, the patient is considered at low risk for cardiovascular complications.  2.  Peripheral arterial disease with abdominal aortic occlusion.  Consider adding low-dose aspirin.  3.  Hyperlipidemia: Given the presence of peripheral arterial disease, recommend treatment with a  statin.  I do not have the results of his labs and I am going to forward this to Dr. Dario Guardian    Disposition:   FU with me as needed.  Signed,  Lorine Bears, MD  05/14/2017 1:16 PM    [facility]

## 2017-05-21 NOTE — Patient Instructions (Signed)
Your procedure is scheduled on: 06/01/17 @ 9:00 am  Report to Same Day Surgery 2nd floor medical mall [facility](Medical Mall Entrance-take elevator on left to 2nd floor.  Check in with surgery information desk.)   Remember: Instructions that are not followed completely may result in serious medical risk, up to and including death, or upon the discretion of your surgeon and anesthesiologist your surgery may need to be rescheduled.    _x___ 1. Do not eat food after midnight the night before your procedure. You may drink clear liquids up to 2 hours before you are scheduled to arrive at the hospital for your procedure.  Do not drink clear liquids within 2 hours of your scheduled arrival to the hospital.  Clear liquids include  --Water or Apple juice without pulp  --Clear carbohydrate beverage such as ClearFast or Gatorade  --Black Coffee or Clear Tea (No milk, no creamers, do not add anything to                  the coffee or Tea Type 1 and type 2 diabetics should only drink water.  No gum chewing or hard candies.     __x__ 2. No Alcohol for 24 hours before or after surgery.   __x__3. No Smoking or e-cigarettes for 24 prior to surgery.  Do not use any chewable tobacco products for at least 6 hour prior to surgery   ____  4. Bring all medications with you on the day of surgery if instructed.    __x__ 5. Notify your doctor if there is any change in your medical condition     (cold, fever, infections).    x___6. On the morning of surgery brush your teeth with toothpaste and water.  You may rinse your mouth with mouth wash if you wish.  Do not swallow any toothpaste or mouthwash.   Do not wear jewelry, make-up, hairpins, clips or nail polish.  Do not wear lotions, powders, or perfumes. You may wear deodorant.  Do not shave 48 hours prior to surgery. Men may shave face and neck.  Do not bring valuables to the hospital.    [facility] not [facility] for any belongings or valuables.    Contacts, dentures or bridgework may not be worn into surgery.  Leave your suitcase in the car. After surgery it may be brought to your room.  For patients admitted to the hospital, discharge time is determined by your                       treatment team.  _  Patients discharged the day of surgery will not be allowed to drive home.  You will need someone to drive you home and stay with you the night of your procedure.    Please read over the following fact sheets that you were given:   [facility] Preparing [facility] and or MRSA Information   _x___ Take anti-hypertensive listed below, cardiac, seizure, asthma,     anti-reflux and psychiatric medicines. These include:  1. None  2.  3.  4.  5.  6.  ____Fleets enema or Magnesium Citrate as directed.   _x___ Use CHG Soap or sage wipes as directed on instruction sheet   ____ Use inhalers on the day of surgery and bring to hospital day of surgery  ____ Stop Metformin and Janumet 2 days prior to surgery.    ____ Take 1/2 of usual insulin dose the night before surgery and  none on the morning     surgery.   _x___ Follow recommendations from Cardiologist, Pulmonologist or PCP regarding          stopping Aspirin, Coumadin, Plavix ,Eliquis, Effient, or Pradaxa, and Pletal.  X____Stop Anti-inflammatories such as Advil, Aleve, Ibuprofen, Motrin, Naproxen, Naprosyn, Goodies powders or aspirin products. OK to take Tylenol and                          Celebrex.   _x___ Stop supplements until after surgery.  But may continue Vitamin D, Vitamin B,       and multivitamin.   ____ Bring C-Pap to the hospital.

## 2017-05-22 ENCOUNTER — Inpatient Hospital Stay: Payer: BLUE CROSS/BLUE SHIELD | Admitting: Registered Nurse

## 2017-05-22 ENCOUNTER — Inpatient Hospital Stay: Payer: BLUE CROSS/BLUE SHIELD

## 2017-05-22 ENCOUNTER — Other Ambulatory Visit: Payer: Self-pay

## 2017-05-22 ENCOUNTER — Encounter: Payer: Self-pay | Admitting: *Deleted

## 2017-05-22 ENCOUNTER — Encounter
Admission: RE | Disposition: A | Discharge status: Home / Self Care | Payer: Self-pay | Source: Ambulatory Visit | Attending: Vascular Surgery

## 2017-05-22 ENCOUNTER — Inpatient Hospital Stay
Admission: RE | Admit: 2017-05-22 | Discharge: 2017-05-30 | DRG: 271 | Disposition: A | Discharge status: Home / Self Care | Payer: BLUE CROSS/BLUE SHIELD | Source: Ambulatory Visit | Attending: Vascular Surgery | Admitting: Vascular Surgery

## 2017-05-22 DIAGNOSIS — I700 Atherosclerosis of aorta: Secondary | ICD-10-CM | POA: Diagnosis present

## 2017-05-22 DIAGNOSIS — Z8249 Family history of ischemic heart disease and other diseases of the circulatory system: Secondary | ICD-10-CM | POA: Diagnosis not present

## 2017-05-22 DIAGNOSIS — R140 Abdominal distension (gaseous): Secondary | ICD-10-CM | POA: Diagnosis not present

## 2017-05-22 DIAGNOSIS — E875 Hyperkalemia: Secondary | ICD-10-CM | POA: Diagnosis not present

## 2017-05-22 DIAGNOSIS — R000 Tachycardia, unspecified: Secondary | ICD-10-CM | POA: Diagnosis not present

## 2017-05-22 DIAGNOSIS — F17211 Nicotine dependence, cigarettes, in remission: Secondary | ICD-10-CM | POA: Diagnosis present

## 2017-05-22 DIAGNOSIS — Z452 Encounter for adjustment and management of vascular access device: Secondary | ICD-10-CM

## 2017-05-22 DIAGNOSIS — J9601 Acute respiratory failure with hypoxia: Secondary | ICD-10-CM | POA: Diagnosis not present

## 2017-05-22 DIAGNOSIS — I70223 Atherosclerosis of native arteries of extremities with rest pain, bilateral legs: Secondary | ICD-10-CM | POA: Diagnosis present

## 2017-05-22 DIAGNOSIS — N179 Acute kidney failure, unspecified: Secondary | ICD-10-CM | POA: Diagnosis not present

## 2017-05-22 DIAGNOSIS — I7409 Other arterial embolism and thrombosis of abdominal aorta: Secondary | ICD-10-CM | POA: Diagnosis present

## 2017-05-22 DIAGNOSIS — G8918 Other acute postprocedural pain: Secondary | ICD-10-CM | POA: Diagnosis not present

## 2017-05-22 DIAGNOSIS — Z833 Family history of diabetes mellitus: Secondary | ICD-10-CM | POA: Diagnosis not present

## 2017-05-22 DIAGNOSIS — Z79899 Other long term (current) drug therapy: Secondary | ICD-10-CM | POA: Diagnosis not present

## 2017-05-22 DIAGNOSIS — Z820 Family history of epilepsy and other diseases of the nervous system: Secondary | ICD-10-CM | POA: Diagnosis not present

## 2017-05-22 DIAGNOSIS — J029 Acute pharyngitis, unspecified: Secondary | ICD-10-CM | POA: Diagnosis not present

## 2017-05-22 HISTORY — PX: AORTA - BILATERAL FEMORAL ARTERY BYPASS GRAFT: SHX1175

## 2017-05-22 HISTORY — PX: CENTRAL VENOUS CATHETER INSERTION: SHX401

## 2017-05-22 LAB — GLUCOSE, CAPILLARY: Glucose-Capillary: 163 mg/dL — ABNORMAL HIGH (ref 65–99)

## 2017-05-22 SURGERY — CREATION, BYPASS, ARTERIAL, AORTA TO FEMORAL, BILATERAL, USING GRAFT
Anesthesia: General | Site: Neck | Wound class: Clean

## 2017-05-22 MED ORDER — LIDOCAINE HCL (PF) 2 % IJ SOLN
INTRAMUSCULAR | Status: AC
  Filled 2017-05-22: qty 10

## 2017-05-22 MED ORDER — HEPARIN SODIUM (PORCINE) 10000 UNIT/ML IJ SOLN
INTRAMUSCULAR | Status: AC
  Filled 2017-05-22: qty 3

## 2017-05-22 MED ORDER — ROCURONIUM BROMIDE 50 MG/5ML IV SOLN
INTRAVENOUS | Status: AC
  Filled 2017-05-22: qty 1

## 2017-05-22 MED ORDER — PROPOFOL 10 MG/ML IV BOLUS
INTRAVENOUS | Status: DC | PRN
  Administered 2017-05-22: 150 mg via INTRAVENOUS

## 2017-05-22 MED ORDER — ALBUMIN HUMAN 5 % IV SOLN
INTRAVENOUS | Status: DC | PRN
Start: 2017-05-22 — End: 2017-05-22
  Administered 2017-05-22: 16:00:00 via INTRAVENOUS

## 2017-05-22 MED ORDER — ONDANSETRON HCL 4 MG/2ML IJ SOLN
INTRAMUSCULAR | Status: DC | PRN
  Administered 2017-05-22: 4 mg via INTRAVENOUS

## 2017-05-22 MED ORDER — ONDANSETRON HCL 4 MG/2ML IJ SOLN
INTRAMUSCULAR | Status: AC
  Filled 2017-05-22: qty 2

## 2017-05-22 MED ORDER — HYDROMORPHONE HCL 1 MG/ML IJ SOLN
1.0000 mg | INTRAMUSCULAR | Status: DC | PRN
  Administered 2017-05-22: 1 mg via INTRAVENOUS
  Filled 2017-05-22: qty 1

## 2017-05-22 MED ORDER — HYDROMORPHONE HCL 1 MG/ML IJ SOLN
1.0000 mg | Freq: Once | INTRAMUSCULAR | Status: AC
  Administered 2017-05-22: 1 mg via INTRAVENOUS
  Filled 2017-05-22: qty 1

## 2017-05-22 MED ORDER — CEFAZOLIN SODIUM-DEXTROSE 2-4 GM/100ML-% IV SOLN
INTRAVENOUS | Status: AC
  Filled 2017-05-22: qty 100

## 2017-05-22 MED ORDER — SODIUM CHLORIDE 0.9 % IJ SOLN
INTRAMUSCULAR | Status: AC
  Filled 2017-05-22: qty 50

## 2017-05-22 MED ORDER — FENTANYL CITRATE (PF) 100 MCG/2ML IJ SOLN
INTRAMUSCULAR | Status: AC
  Administered 2017-05-22: 25 ug via INTRAVENOUS
  Filled 2017-05-22: qty 2

## 2017-05-22 MED ORDER — ONDANSETRON HCL 4 MG/2ML IJ SOLN: 4.0000 mg | Freq: Four times a day (QID) | INTRAMUSCULAR | Status: DC | PRN

## 2017-05-22 MED ORDER — MIDAZOLAM HCL 2 MG/2ML IJ SOLN
INTRAMUSCULAR | Status: DC | PRN
  Administered 2017-05-22: 2 mg via INTRAVENOUS

## 2017-05-22 MED ORDER — HEPARIN SODIUM (PORCINE) 5000 UNIT/ML IJ SOLN
INTRAMUSCULAR | Status: AC
  Filled 2017-05-22: qty 1

## 2017-05-22 MED ORDER — HYDROMORPHONE HCL 1 MG/ML IJ SOLN
1.0000 mg | INTRAMUSCULAR | Status: DC | PRN
  Filled 2017-05-22: qty 1

## 2017-05-22 MED ORDER — ROCURONIUM BROMIDE 100 MG/10ML IV SOLN
INTRAVENOUS | Status: DC | PRN
  Administered 2017-05-22: 10 mg via INTRAVENOUS
  Administered 2017-05-22: 20 mg via INTRAVENOUS
  Administered 2017-05-22 (×2): 50 mg via INTRAVENOUS

## 2017-05-22 MED ORDER — HYDROMORPHONE HCL 1 MG/ML IJ SOLN
1.0000 mg | INTRAMUSCULAR | Status: AC
  Administered 2017-05-23: 1 mg via INTRAVENOUS

## 2017-05-22 MED ORDER — FENTANYL CITRATE (PF) 100 MCG/2ML IJ SOLN
25.0000 ug | INTRAMUSCULAR | Status: AC | PRN
  Administered 2017-05-22 (×6): 25 ug via INTRAVENOUS

## 2017-05-22 MED ORDER — LIDOCAINE HCL (CARDIAC) 20 MG/ML IV SOLN
INTRAVENOUS | Status: DC | PRN
  Administered 2017-05-22: 100 mg via INTRAVENOUS

## 2017-05-22 MED ORDER — FENTANYL CITRATE (PF) 100 MCG/2ML IJ SOLN
INTRAMUSCULAR | Status: AC
  Filled 2017-05-22: qty 2

## 2017-05-22 MED ORDER — FAMOTIDINE 20 MG PO TABS
ORAL_TABLET | ORAL | Status: AC
  Administered 2017-05-22: 20 mg via ORAL
  Filled 2017-05-22: qty 1

## 2017-05-22 MED ORDER — MANNITOL 25 % IV SOLN
INTRAVENOUS | Status: DC | PRN
  Administered 2017-05-22: 25 g via INTRAVENOUS

## 2017-05-22 MED ORDER — PANTOPRAZOLE SODIUM 40 MG IV SOLR
40.0000 mg | INTRAVENOUS | Status: DC
  Administered 2017-05-22 – 2017-05-29 (×8): 40 mg via INTRAVENOUS
  Filled 2017-05-22 (×8): qty 40

## 2017-05-22 MED ORDER — PROPOFOL 10 MG/ML IV BOLUS
INTRAVENOUS | Status: AC
  Filled 2017-05-22: qty 20

## 2017-05-22 MED ORDER — MANNITOL 25 % IV SOLN
INTRAVENOUS | Status: AC
  Filled 2017-05-22: qty 50

## 2017-05-22 MED ORDER — ALUM & MAG HYDROXIDE-SIMETH 200-200-20 MG/5ML PO SUSP
15.0000 mL | ORAL | Status: DC | PRN
  Filled 2017-05-22: qty 30

## 2017-05-22 MED ORDER — SODIUM CHLORIDE 0.9 % IV SOLN
INTRAVENOUS | Status: DC | PRN
  Administered 2017-05-22: 16:00:00 600 mL via INTRAMUSCULAR

## 2017-05-22 MED ORDER — MORPHINE SULFATE (PF) 2 MG/ML IV SOLN
2.0000 mg | INTRAVENOUS | Status: DC | PRN
  Administered 2017-05-22: 4 mg via INTRAVENOUS
  Administered 2017-05-22 (×2): 2 mg via INTRAVENOUS
  Administered 2017-05-22: 4 mg via INTRAVENOUS
  Filled 2017-05-22 (×2): qty 2
  Filled 2017-05-22: qty 1

## 2017-05-22 MED ORDER — ACETAMINOPHEN 325 MG PO TABS: 650.0000 mg | ORAL_TABLET | Freq: Every day | ORAL | Status: DC | PRN

## 2017-05-22 MED ORDER — SODIUM CHLORIDE 0.9 % IV SOLN: 500.0000 mL | Freq: Once | INTRAVENOUS | Status: DC | PRN

## 2017-05-22 MED ORDER — ACETAMINOPHEN 650 MG RE SUPP: 325.0000 mg | RECTAL | Status: DC | PRN

## 2017-05-22 MED ORDER — SUGAMMADEX SODIUM 200 MG/2ML IV SOLN
INTRAVENOUS | Status: DC | PRN
  Administered 2017-05-22: 150 mg via INTRAVENOUS

## 2017-05-22 MED ORDER — PHENYLEPHRINE HCL 10 MG/ML IJ SOLN
INTRAMUSCULAR | Status: DC | PRN
  Administered 2017-05-22: 200 ug via INTRAVENOUS
  Administered 2017-05-22: 100 ug via INTRAVENOUS
  Administered 2017-05-22: 200 ug via INTRAVENOUS
  Administered 2017-05-22: 50 ug via INTRAVENOUS
  Administered 2017-05-22: 100 ug via INTRAVENOUS

## 2017-05-22 MED ORDER — VITAMIN C 500 MG PO TABS
500.0000 mg | ORAL_TABLET | ORAL | Status: DC
  Filled 2017-05-22: qty 1

## 2017-05-22 MED ORDER — HEPARIN SODIUM (PORCINE) 1000 UNIT/ML IJ SOLN
INTRAMUSCULAR | Status: DC | PRN
  Administered 2017-05-22 (×2): 1000 [IU] via INTRAVENOUS
  Administered 2017-05-22 (×2): 3000 [IU] via INTRAVENOUS

## 2017-05-22 MED ORDER — NITROGLYCERIN IN D5W 200-5 MCG/ML-% IV SOLN: 5.0000 ug/min | INTRAVENOUS | Status: DC

## 2017-05-22 MED ORDER — LABETALOL HCL 5 MG/ML IV SOLN: 10.0000 mg | INTRAVENOUS | Status: DC | PRN

## 2017-05-22 MED ORDER — FENTANYL CITRATE (PF) 100 MCG/2ML IJ SOLN
INTRAMUSCULAR | Status: DC | PRN
  Administered 2017-05-22: 100 ug via INTRAVENOUS
  Administered 2017-05-22: 25 ug via INTRAVENOUS
  Administered 2017-05-22: 100 ug via INTRAVENOUS

## 2017-05-22 MED ORDER — MORPHINE SULFATE (PF) 2 MG/ML IV SOLN
INTRAVENOUS | Status: AC
  Filled 2017-05-22: qty 1

## 2017-05-22 MED ORDER — SUGAMMADEX SODIUM 200 MG/2ML IV SOLN
INTRAVENOUS | Status: AC
  Filled 2017-05-22: qty 2

## 2017-05-22 MED ORDER — SODIUM CHLORIDE 0.9 % IV SOLN
INTRAVENOUS | Status: DC | PRN
  Administered 2017-05-22: 50 ug/min via INTRAVENOUS

## 2017-05-22 MED ORDER — EVICEL 5 ML EX KIT
PACK | CUTANEOUS | Status: AC
  Filled 2017-05-22: qty 2

## 2017-05-22 MED ORDER — CEFAZOLIN SODIUM-DEXTROSE 2-4 GM/100ML-% IV SOLN
2.0000 g | Freq: Three times a day (TID) | INTRAVENOUS | Status: DC
  Administered 2017-05-23: 2 g via INTRAVENOUS
  Filled 2017-05-22 (×2): qty 100

## 2017-05-22 MED ORDER — MAGNESIUM SULFATE 2 GM/50ML IV SOLN: 2.0000 g | Freq: Every day | INTRAVENOUS | Status: DC | PRN

## 2017-05-22 MED ORDER — ONDANSETRON HCL 4 MG/2ML IJ SOLN: 4.0000 mg | Freq: Once | INTRAMUSCULAR | Status: DC | PRN

## 2017-05-22 MED ORDER — DEXAMETHASONE SODIUM PHOSPHATE 10 MG/ML IJ SOLN
INTRAMUSCULAR | Status: DC | PRN
  Administered 2017-05-22: 4 mg via INTRAVENOUS

## 2017-05-22 MED ORDER — PHENYLEPHRINE HCL 10 MG/ML IJ SOLN
INTRAMUSCULAR | Status: AC
  Filled 2017-05-22: qty 1

## 2017-05-22 MED ORDER — PHENOL 1.4 % MT LIQD
1.0000 | OROMUCOSAL | Status: DC | PRN
  Administered 2017-05-27: 1 via OROMUCOSAL
  Filled 2017-05-22 (×2): qty 177

## 2017-05-22 MED ORDER — MIDAZOLAM HCL 2 MG/2ML IJ SOLN
INTRAMUSCULAR | Status: AC
  Filled 2017-05-22: qty 2

## 2017-05-22 MED ORDER — DEXAMETHASONE SODIUM PHOSPHATE 10 MG/ML IJ SOLN
INTRAMUSCULAR | Status: AC
Start: 2017-05-22 — End: 2017-05-22
  Filled 2017-05-22: qty 1

## 2017-05-22 MED ORDER — LACTATED RINGERS IV SOLN
INTRAVENOUS | Status: DC
  Administered 2017-05-22: 10:00:00 via INTRAVENOUS

## 2017-05-22 MED ORDER — EVICEL 5 ML EX KIT
PACK | CUTANEOUS | Status: DC | PRN
  Administered 2017-05-22: 2

## 2017-05-22 MED ORDER — METOPROLOL TARTRATE 5 MG/5ML IV SOLN: 2.0000 mg | INTRAVENOUS | Status: DC | PRN

## 2017-05-22 MED ORDER — VITAMIN D 1000 UNITS PO TABS
2000.0000 [IU] | ORAL_TABLET | ORAL | Status: DC
  Administered 2017-05-27 – 2017-05-30 (×2): 2000 [IU] via ORAL
  Filled 2017-05-22 (×2): qty 2

## 2017-05-22 MED ORDER — ACETAMINOPHEN 325 MG PO TABS
325.0000 mg | ORAL_TABLET | ORAL | Status: DC | PRN
  Administered 2017-05-27: 650 mg via ORAL
  Filled 2017-05-22 (×2): qty 2

## 2017-05-22 MED ORDER — OXYCODONE-ACETAMINOPHEN 5-325 MG PO TABS
1.0000 | ORAL_TABLET | ORAL | Status: DC | PRN
  Administered 2017-05-22 – 2017-05-23 (×2): 2 via ORAL
  Filled 2017-05-22 (×2): qty 2

## 2017-05-22 MED ORDER — DOPAMINE-DEXTROSE 3.2-5 MG/ML-% IV SOLN: 3.0000 ug/kg/min | INTRAVENOUS | Status: DC

## 2017-05-22 MED ORDER — HYDRALAZINE HCL 20 MG/ML IJ SOLN: 5.0000 mg | INTRAMUSCULAR | Status: DC | PRN

## 2017-05-22 MED ORDER — SODIUM CHLORIDE 0.9 % IV SOLN
INTRAVENOUS | Status: DC
  Administered 2017-05-22 – 2017-05-28 (×12): via INTRAVENOUS

## 2017-05-22 MED ORDER — FAMOTIDINE 20 MG PO TABS
20.0000 mg | ORAL_TABLET | Freq: Once | ORAL | Status: AC
  Administered 2017-05-22: 20 mg via ORAL

## 2017-05-22 MED ORDER — POTASSIUM CHLORIDE CRYS ER 20 MEQ PO TBCR: 20.0000 meq | EXTENDED_RELEASE_TABLET | Freq: Every day | ORAL | Status: DC | PRN

## 2017-05-22 MED ORDER — HYDROMORPHONE HCL 1 MG/ML IJ SOLN
INTRAMUSCULAR | Status: DC | PRN
  Administered 2017-05-22: .2 mg via INTRAVENOUS
  Administered 2017-05-22 (×2): .4 mg via INTRAVENOUS

## 2017-05-22 MED ORDER — HYDROMORPHONE HCL 1 MG/ML IJ SOLN
INTRAMUSCULAR | Status: AC
  Filled 2017-05-22: qty 1

## 2017-05-22 SURGICAL SUPPLY — 95 items
APPLIER CLIP 11 MED OPEN (CLIP)
APPLIER CLIP 13 LRG OPEN (CLIP)
APPLIER CLIP 9.375 SM OPEN (CLIP)
BAG COUNTER SPONGE EZ (MISCELLANEOUS) IMPLANT
BAG DECANTER FOR FLEXI CONT (MISCELLANEOUS) ×4 IMPLANT
BAGS BLOOD CELL SAVER (MISCELLANEOUS) ×8 IMPLANT
BLADE SURG 15 STRL LF DISP TIS (BLADE) ×2 IMPLANT
BLADE SURG 15 STRL SS (BLADE) ×2
BLADE SURG SZ11 CARB STEEL (BLADE) ×4 IMPLANT
BOOT SUTURE AID YELLOW STND (SUTURE) ×8 IMPLANT
BRUSH SCRUB EZ  4% CHG (MISCELLANEOUS) ×2
BRUSH SCRUB EZ 4% CHG (MISCELLANEOUS) ×2 IMPLANT
CANISTER SUCT 3000ML PPV (MISCELLANEOUS) ×4 IMPLANT
CELL SAVER ADDITIONAL TIME PER (MISCELLANEOUS) ×600
CELL SAVER BLD BAGS (MISCELLANEOUS) ×16
CELL SAVER COLL SVCS (MISCELLANEOUS) ×4
CELL SAVER SAMPLING PO (MISCELLANEOUS) ×4
CLIP APPLIE 11 MED OPEN (CLIP) IMPLANT
CLIP APPLIE 13 LRG OPEN (CLIP) IMPLANT
CLIP APPLIE 9.375 SM OPEN (CLIP) IMPLANT
COUNTER SPONGE BAG EZ (MISCELLANEOUS)
COVER PROBE FLX POLY STRL (MISCELLANEOUS) ×8 IMPLANT
DECANTER SPIKE VIAL GLASS SM (MISCELLANEOUS) ×4 IMPLANT
DRAPE INCISE IOBAN 66X45 STRL (DRAPES) ×4 IMPLANT
DRAPE INCISE IOBAN 66X60 STRL (DRAPES) ×4 IMPLANT
DRAPE MAG INST 16X20 L/F (DRAPES) ×4 IMPLANT
DRAPE TABLE BACK 80X90 (DRAPES) ×4 IMPLANT
DRESSING SURGICEL FIBRLLR 1X2 (HEMOSTASIS) ×6 IMPLANT
DRSG OPSITE POSTOP 4X14 (GAUZE/BANDAGES/DRESSINGS) ×4 IMPLANT
DRSG OPSITE POSTOP 4X6 (GAUZE/BANDAGES/DRESSINGS) ×4 IMPLANT
DRSG SURGICEL FIBRILLAR 1X2 (HEMOSTASIS) ×12
DRSG TEGADERM 4X10 (GAUZE/BANDAGES/DRESSINGS) ×8 IMPLANT
DRSG TEGADERM 4X4.75 (GAUZE/BANDAGES/DRESSINGS) ×8 IMPLANT
DRSG TELFA 3X8 NADH (GAUZE/BANDAGES/DRESSINGS) ×8 IMPLANT
DURAPREP 26ML APPLICATOR (WOUND CARE) ×8 IMPLANT
ELECT BLADE 6.5 EXT (BLADE) ×4 IMPLANT
ELECT CAUTERY BLADE 6.4 (BLADE) ×8 IMPLANT
ELECT REM PT RETURN 9FT ADLT (ELECTROSURGICAL) ×8
ELECTRODE REM PT RTRN 9FT ADLT (ELECTROSURGICAL) ×4 IMPLANT
GAUZE SPONGE 4X4 12PLY STRL (GAUZE/BANDAGES/DRESSINGS) ×4 IMPLANT
GLOVE BIO SURGEON STRL SZ7 (GLOVE) ×8 IMPLANT
GLOVE INDICATOR 7.5 STRL GRN (GLOVE) ×4 IMPLANT
GLOVE SURG SYN 8.0 (GLOVE) ×8 IMPLANT
GOWN STRL REUS W/ TWL LRG LVL3 (GOWN DISPOSABLE) ×6 IMPLANT
GOWN STRL REUS W/ TWL XL LVL3 (GOWN DISPOSABLE) ×4 IMPLANT
GOWN STRL REUS W/TWL LRG LVL3 (GOWN DISPOSABLE) ×6
GOWN STRL REUS W/TWL XL LVL3 (GOWN DISPOSABLE) ×4
GRAFT VASC BIF 14X7X40 (Graft) ×4 IMPLANT
HANDLE YANKAUER SUCT BULB TIP (MISCELLANEOUS) ×4 IMPLANT
IV CONNECTOR CLAVE PORT MALE (IV SETS) ×12 IMPLANT
KIT CATH CVC 3 LUMEN 7FR 8IN (MISCELLANEOUS) ×4 IMPLANT
KIT TURNOVER KIT A (KITS) ×4 IMPLANT
LABEL OR SOLS (LABEL) ×4 IMPLANT
LOOP RED MAXI  1X406MM (MISCELLANEOUS) ×8
LOOP VESSEL MAXI 1X406 RED (MISCELLANEOUS) ×8 IMPLANT
LOOP VESSEL MINI 0.8X406 BLUE (MISCELLANEOUS) ×4 IMPLANT
LOOP VESSEL SUPERMAXI WHITE (MISCELLANEOUS) ×4 IMPLANT
LOOPS BLUE MINI 0.8X406MM (MISCELLANEOUS) ×4
NEEDLE FILTER BLUNT 18X 1/2SAF (NEEDLE) ×2
NEEDLE FILTER BLUNT 18X1 1/2 (NEEDLE) ×2 IMPLANT
NS IRRIG 1000ML POUR BTL (IV SOLUTION) ×12 IMPLANT
PACK BASIN MAJOR ARMC (MISCELLANEOUS) ×4 IMPLANT
PACK UNIVERSAL (MISCELLANEOUS) ×4 IMPLANT
PENCIL ELECTRO HAND CTR (MISCELLANEOUS) ×4 IMPLANT
RETAINER VISCERA MED (MISCELLANEOUS) ×4 IMPLANT
SAMPLING PO CELL SAVER (MISCELLANEOUS) ×2 IMPLANT
SAVER CELL COLL SVCS (MISCELLANEOUS) ×2 IMPLANT
SOL .9 NS 3000ML IRR  AL (IV SOLUTION) ×6
SOL .9 NS 3000ML IRR UROMATIC (IV SOLUTION) ×6 IMPLANT
SPONGE LAP 18X18 5 PK (GAUZE/BANDAGES/DRESSINGS) ×8 IMPLANT
SPONGE LAP 18X36 2PK (MISCELLANEOUS) ×4 IMPLANT
STAPLER SKIN PROX 35W (STAPLE) ×12 IMPLANT
SUT ETHIBOND 3 0 SH 1 (SUTURE) ×16 IMPLANT
SUT PDS AB 1 TP1 96 (SUTURE) ×8 IMPLANT
SUT PROLENE 3 0 SH DA (SUTURE) ×16 IMPLANT
SUT PROLENE 4 0 SH DA (SUTURE) ×16 IMPLANT
SUT PROLENE 5 0 RB 1 DA (SUTURE) ×8 IMPLANT
SUT PROLENE 6 0 BV (SUTURE) ×40 IMPLANT
SUT SILK 2 0 (SUTURE) ×2
SUT SILK 2 0 SH (SUTURE) ×4 IMPLANT
SUT SILK 2-0 18XBRD TIE 12 (SUTURE) ×2 IMPLANT
SUT SILK 3 0 (SUTURE) ×2
SUT SILK 3-0 18XBRD TIE 12 (SUTURE) ×2 IMPLANT
SUT SILK 4 0 (SUTURE) ×2
SUT SILK 4-0 18XBRD TIE 12 (SUTURE) ×2 IMPLANT
SUT VIC AB 0 CT1 36 (SUTURE) IMPLANT
SUT VIC AB 2-0 CT1 (SUTURE) ×16 IMPLANT
SUT VICRYL+ 3-0 36IN CT-1 (SUTURE) ×16 IMPLANT
SYR 20CC LL (SYRINGE) ×4 IMPLANT
SYR 3ML LL SCALE MARK (SYRINGE) ×4 IMPLANT
SYR BULB IRRIG 60ML STRL (SYRINGE) ×4 IMPLANT
TAPE UMBIL 1/8X18 RADIOPA (MISCELLANEOUS) ×4 IMPLANT
TIME ADDITIONAL PER CELL SAVER (MISCELLANEOUS) ×300 IMPLANT
TOWEL OR 17X26 4PK STRL BLUE (TOWEL DISPOSABLE) ×4 IMPLANT
TRAY FOLEY W/METER SILVER 16FR (SET/KITS/TRAYS/PACK) ×4 IMPLANT

## 2017-05-22 NOTE — Progress Notes (Signed)
Prepare to transfer to room 103. Report called to RN.Family at bedside and aware.

## 2017-05-22 NOTE — H&P (Signed)
[facility] History & Physical Update  The patient was interviewed and re-examined.  The patient's previous History and Physical has been reviewed and is unchanged.  There is no change in the plan of care. We plan to proceed with the scheduled procedure.  Levora DredgeGregory Magdaline Zollars, MD  05/22/2017, 9:50 AM

## 2017-05-22 NOTE — Anesthesia Procedure Notes (Addendum)
Arterial Line Insertion Start/End3/20/2019 11:45 AM, 05/22/2017 11:47 AM Performed by: Naomie DeanKephart, William K, MD, Karoline CaldwellStarr, Deana, CRNA, CRNA  Patient location: OR. Preanesthetic checklist: patient identified, IV checked, site marked, risks and benefits discussed, surgical consent, monitors and equipment checked, pre-op evaluation, timeout performed and anesthesia consent Patient sedated Right, radial was placed  Attempts: 2 Procedure performed without using ultrasound guided technique. Following insertion, dressing applied and Biopatch. Post procedure assessment: normal  Patient tolerated the procedure well with no immediate complications. Additional procedure comments: Inserted by Lillette BoxerJamie Justn Quale, SRNA.

## 2017-05-22 NOTE — Transfer of Care (Signed)
Immediate Anesthesia Transfer of Care Note  Patient: Douglas ShoneFarzad Collet  Procedure(s) Performed: AORTA BIFEMORAL BYPASS GRAFT (N/A ) INSERTION CENTRAL LINE ADULT Left Internal Jugular (Left Neck)  Patient Location: PACU  Anesthesia Type:General  Level of Consciousness: sedated  Airway & Oxygen Therapy: Patient Spontanous Breathing and Patient connected to face mask oxygen  Post-op Assessment: Report given to RN and Post -op Vital signs reviewed and stable  Post vital signs: Reviewed and stable  Last Vitals:  Vitals:   05/22/17 0900 05/22/17 1652  BP: 120/71 106/77  Pulse: 80 90  Resp: 18 16  Temp: 36.6 C 36.9 C  SpO2: 99% 100%    Last Pain:  Vitals:   05/22/17 0900  TempSrc: Tympanic  PainSc: 0-No pain         Complications: No apparent anesthesia complications

## 2017-05-22 NOTE — Consult Note (Addendum)
Name: [name] MRN: [id number] DOB: 08-02-1965    ADMISSION DATE:  05/22/2017 CONSULTATION DATE: 05/22/2017  REFERRING MD : Dr. 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 Due to risk of limb threatening ischemia on 03/20 vascular surgery performed aorto-iliac bypass graft with bilateral iliac artery endarderectomies.  He was subsequently admitted to ICU postop by vascular surgery for further workup and treatment.  PAST MEDICAL HISTORY :   has a past medical history of Leg pain, Low back pain (04/04/2017), and Patient denies medical problems.  has a past surgical history that includes Knee surgery (2006); Colonoscopy with propofol (N/A, 03/27/2017); and Kidney stone surgery. Prior to Admission medications   Medication Sig Start Date End Date Taking? Authorizing Provider  acetaminophen (TYLENOL) 325 MG tablet Take 650 mg by mouth daily as needed for moderate pain.   Yes [provider]  Cholecalciferol (VITAMIN D) 2000 units tablet Take 2,000 Units by mouth 2 (two) times a week.   Yes [provider]  vitamin C (ASCORBIC ACID) 500 MG tablet Take 500 mg by mouth 2 (two) times a week.   Yes [provider]   No Known Allergies  FAMILY HISTORY:  family history includes Alzheimer's disease in his  father; Diabetes in his mother; Heart disease in his mother. SOCIAL HISTORY:  reports that he quit smoking about 7 weeks ago. His smoking use included cigarettes. He has a 7.50 pack-year smoking history. he has never used smokeless tobacco. He reports that he does not drink alcohol or use drugs.  REVIEW OF SYSTEMS: Positives in BOLD    Constitutional: Negative for fever, chills, weight loss, malaise/fatigue and diaphoresis.  HENT: Negative for hearing loss, ear pain, nosebleeds, congestion, sore throat, neck pain, tinnitus and ear discharge.   Eyes: Negative for blurred vision, double vision, photophobia, pain, discharge and redness.  Respiratory: Negative for cough, hemoptysis, sputum production, shortness of breath, wheezing and stridor.   Cardiovascular: Negative for chest pain, palpitations, orthopnea, claudication, leg swelling and PND.  Gastrointestinal: heartburn, nausea, vomiting, abdominal pain, diarrhea, constipation, blood in stool and melena.  Genitourinary: Negative for dysuria, urgency, frequency, hematuria and flank pain.  Musculoskeletal: Negative for myalgias, back pain, joint pain and falls.  Skin: Negative for itching and rash.  Neurological: Negative for dizziness, tingling, tremors, sensory change, speech change, focal weakness, seizures, loss of consciousness, weakness and headaches.  Endo/Heme/Allergies: Negative for environmental allergies and polydipsia. Does not bruise/bleed easily.  SUBJECTIVE:  Pt c/o abdominal pain   VITAL SIGNS: Temp:  [97.6 F (36.4 C)-98.5 F (36.9 C)] 97.6 F (36.4 C) (03/20 1800) Pulse Rate:  [77-94] 94 (03/20 1900) Resp:  [0-21] 21 (03/20 1900) BP: (106-142)/(71-90) 137/88 (03/20 1900) SpO2:  [97 %-100 %] 98 % (03/20 1900) Arterial Line BP: (113-144)/(65-79) 130/74 (03/20 1900) Weight:  [72.6 kg (160 lb)-73.8 kg (162 lb 11.2 oz)] 73.8 kg (162 lb 11.2 oz) (03/20 1800)  PHYSICAL EXAMINATION:  General: well developed, well nourished  male, NAD  Neuro: alert and oriented, follows commands  HEENT: supple, no JVD Cardiovascular: nsr, rrr, no M/R/G Lungs: shallow respirations, diminished throughout, even, non labored Abdomen: +BS x4, soft, tender, non distended, midline abd incision  Musculoskeletal: normal bulk and tone, no edema  Skin: midline abdominal incision with honeycomb dressing minimal dried bloody drainage dressing intact   Recent Labs  Lab 05/21/17 1449  NA 142  K 4.7  CL 106  CO2 26  BUN 14  CREATININE 0.65  GLUCOSE 74   Recent Labs  Lab 05/21/17 1449  HGB 15.4  HCT 44.8  WBC 4.7  PLT 222   Dg Chest 1 View  Result Date: 05/22/2017 CLINICAL DATA:  Central line placement EXAM: CHEST  1 VIEW COMPARISON:  12/11/2004 FINDINGS: Low lung volumes. Left-sided central venous catheter tip overlies the proximal right atrium. No left pneumothorax. Esophageal tube tip projects over the proximal stomach. Surgical staples overlying the upper abdomen. No focal pulmonary opacity. Normal heart size. IMPRESSION: 1. Left-sided central venous catheter tip overlies the proximal right atrium. No pneumothorax 2. Minimal atelectasis at the bases 3. Esophageal tube tip is looped upon itself over the proximal stomach. Electronically Signed   By: Jasmine PangKim  Fujinaga M.D.   On: 05/22/2017 17:48    ASSESSMENT / PLAN: Atherosclerotic occlusive disease of bilateral lower extremities s/p aorto-iliac bypass graft with bilateral iliac artery endarderectomies  Postop Pain  P: Supplemental O2 for hypoxia and/or dyspnea  Maintain map >65 with fluid resuscitation  NS @125  ml/hr  Trend BMP  Replace electrolytes as indicated  Monitor UOP Trend CBC  Monitor for s/sx of bleeding and transfuse for hgb <7 Trend WBC and monitor fever curve  Continue cefazolin  Continue morphine and percocet for pain management   Sonda Rumbleana Jamale Spangler, AGNP  Pulmonary/Critical Care Pager [phone] (please enter 7 digits) PCCM Consult Pager [phone] (please  enter 7 digits)

## 2017-05-22 NOTE — Anesthesia Preprocedure Evaluation (Signed)
Anesthesia Evaluation  Patient identified by MRN, date of birth, ID band Patient awake    Reviewed: Allergy & Precautions, NPO status , Patient's Chart, lab work & pertinent test results  History of Anesthesia Complications Negative for: history of anesthetic complications  Airway Mallampati: II       Dental   Pulmonary neg sleep apnea, neg COPD, former smoker,           Cardiovascular (-) hypertension+ Peripheral Vascular Disease  (-) Past MI and (-) CHF (-) dysrhythmias (-) pacemaker(-) Valvular Problems/Murmurs     Neuro/Psych neg Seizures    GI/Hepatic Neg liver ROS, neg GERD  ,  Endo/Other  neg diabetes  Renal/GU negative Renal ROS     Musculoskeletal   Abdominal   Peds  Hematology   Anesthesia Other Findings   Reproductive/Obstetrics                             Anesthesia Physical Anesthesia Plan  ASA: II  Anesthesia Plan: General   Post-op Pain Management:    Induction: Intravenous  PONV Risk Score and Plan:   Airway Management Planned: Oral ETT  Additional Equipment:   Intra-op Plan:   Post-operative Plan:   Informed Consent: I have reviewed the patients History and Physical, chart, labs and discussed the procedure including the risks, benefits and alternatives for the proposed anesthesia with the patient or authorized representative who has indicated his/her understanding and acceptance.     Plan Discussed with:   Anesthesia Plan Comments:         Anesthesia Quick Evaluation

## 2017-05-22 NOTE — Anesthesia Procedure Notes (Addendum)
Procedure Name: Intubation Date/Time: 05/22/2017 11:33 AM Performed by: Gunnar Fusi, MD Pre-anesthesia Checklist: Patient identified, Emergency Drugs available, Suction available, Patient being monitored and Timeout performed Patient Re-evaluated:Patient Re-evaluated prior to induction Oxygen Delivery Method: Circle system utilized Preoxygenation: Pre-oxygenation with 100% oxygen Induction Type: IV induction and Cricoid Pressure applied Ventilation: Oral airway inserted - appropriate to patient size and Mask ventilation without difficulty Laryngoscope Size: Mac and 4 Grade View: Grade II Tube type: Oral Tube size: 7.5 mm Number of attempts: 1 Airway Equipment and Method: Stylet Placement Confirmation: ETT inserted through vocal cords under direct vision,  positive ETCO2,  CO2 detector and breath sounds checked- equal and bilateral Secured at: 22 cm Tube secured with: Tape Dental Injury: Teeth and Oropharynx as per pre-operative assessment  Comments: Intubated by Dewitt Hoes, SRNA

## 2017-05-22 NOTE — Progress Notes (Signed)
Arrived from PACU. C/o abdominal pain an 8-10. Morphine given with some relief. VSS  Alert and oriented. MOEx4 with mild weakness. Lungs clear and diminished. O2 at 2L/[place]. Sats good. Has some periods of apnea after pain med. Arouses easily. NG to LIS.Right radial aline reads close to cuff pressure and with good waveform. Midline abdominal incision with honeycomb dressing and 2 small bloody areas. Pedal pulses are strong by doppler. Feet warm to touch with good cap refill. He denies foot/leg pain. NS at 125/hr via [facility].

## 2017-05-22 NOTE — Anesthesia Post-op Follow-up Note (Signed)
Anesthesia QCDR form completed.        

## 2017-05-22 NOTE — Op Note (Signed)
OPERATIVE NOTE   PROCEDURE: 1. Aortic endarterectomy 2.   Aortobiiliac bypass using a 14 x 7 bifurcated dacron graft 3.   Right common and internal iliac artery endarterectomy 4.   Left common and internal iliac artery endarterectomy 5.   Reimplantation of the IMA into the bypass graft  PRE-OPERATIVE DIAGNOSIS: 1.Atherosclerotic occlusive disease bilateral lower extremities with disabling claudication and rest pain of both lower extremities 2. Aortic atherosclerosis  POST-OPERATIVE DIAGNOSIS: Same  SURGEON: Festus Barren, MD  CO-surgeon:Dr. Levora Dredge, MD   ANESTHESIA:  general  ESTIMATED BLOOD LOSS: 1250 cc  FINDING(S): 1. Extensive soft aortic plaque requiring endarterectomy at the proximal endpoint for a clean inflow.  Dense plaque at the iliac bifurcation bilaterally extending largely into the internal iliac artery from the common iliac artery.  Large IMA.  SPECIMEN(S):Aortic plaque, bilateral common and internal iliac artery plaque  INDICATIONS:  Patient is a 52 year old male who presents with disabling claudication that is going to cost him his job as well as early rest pain symptoms.  The risks and benefits as well as alternative therapies including intervention were reviewed in detail all questions were answered the patient agrees to proceed with surgery.  DESCRIPTION: After obtaining full informed written consent, the patient was brought back to the operating room and placed supine upon the operating table. The patient received IV antibiotics prior to induction. After obtaining adequate anesthesia, the patient was prepped and draped in the standard fashion appropriate time out is called. Initially a central line is placed and this will be dictated as a separate note.  The midline incision is then created and the dissection carried down to expose the fascia. The peritoneal cavity is entered without difficulty just below the xiphoid process. As the incision is  extended to the pubic bone and up to the xiphoid process. The omentum was then reflected superiorly and the small intestine is swept into the right gutter. Small intestine was then packed away with a large laparotomy pad which is moistened with saline.  The Omni-Tract retractor was then used to help facilitate our exposure.  The retroperitoneum is then opened along the midline overlying the pulse which is palpable at the level of the duodenum. The inferior mesenteric vein is identified and skeletonized so that he is mobilized and able to be retracted superiorly and left laterally out of the field. The renal vein is then identified. At this point the Omni-Tract is positioned to allow full exposure of the aorta.  The retroperitoneal dissection is then carried inferiorly past the palpable aortic bifurcation. Moving back superiorly the aorta is dissected circumferentially at the level of the renal vein it is soft at this level and acceptable for clamp.  It was known from the CT scan that the aortic plaque extended up to the base of the renal arteries with the right renal artery being the lowest.  It is then dissected along its right side taking care not to injure the vena cava.  There was a small bleeder at the base of the right renal artery that was controlled with 5-0 Prolene suture and hemostasis was achieved.  The dissection is then carried down to the right common iliac and the iliac bifurcation. Attention is then turned to the left common iliac which is dissected down exposing the iliac bifurcation.    6000 units of heparin was given and allowed circulate for 5 minutes. An 14 x 7 Dacron bifurcated graft is then selected and rehydrated on the back table. With myself  working on the leftt side and Dr. Gilda Crease working on the right side the graft will be implanted.  The aorta was clamped just below the level of the lower left renal artery and an aortotomy is made with 11 blade and extended with Potts scissors  the aortic contents are then removed under direct visualization.  The aorta was found to have thick plaque and mural thrombus extending to the majority of the lumen of the aorta and an endarterectomy was performed with a Therapist, nutritional.  The aortotomy was divided down laterally to easily expose the back wall for an end-to-end anastomosis.  This was done after the endarterectomy to the aorta to allow for a clean proximal endpoint.  The aorta was forward flushed. The Dacron graft was then beveled and applied in an end graft to end aortic anastomosis using running 4-0 Prolene. Flushing maneuvers were performed and flow was established back to the distal aorta the graft is clamped just above the suture line.  The large portion of the graft before the bifurcation was kept a little bit longer as it was found a large IMA would need to be planted to this graft after flow was established to the legs.  The IMA was controlled with a vessel loop.  Attention is then turned to the right iliac artery.  An arteriotomy is made with 11 blade and extended with Potts scissors starting in the right common iliac artery and extending down about 3 cm into the right external iliac artery.  A somewhat mobile plaque with chronic thrombus was seen at the iliac bifurcation extending from the common iliac artery down into the internal iliac artery.  An endarterectomy was then performed in the common iliac artery and the internal iliac artery on the right with the [facility].  The distal endpoint in the proximal external iliac artery was tacked down with a 6-0 Prolene suture.  The right limb of the graft is then approximated to the arteriotomy trimmed to an appropriate bevel and an end graft to side iliac artery anastomosis is fashioned including the graft over the origin of the external iliac artery. 5-0 Prolene is used to sew the anastomosis. Flushing maneuvers were performed and flow was established first to the internal and then the  external iliac system. Easily palpable pulses are noted in the groin and in the internal iliac artery distal to the anastomosis.  The left iliac artery is then addressed. Arteriotomy is made in the distal left common iliac artery and extended down about 3 cm into the left external iliac artery.  A very dense calcific plaque was seen extending mostly into the internal iliac artery and encompassing the distal common iliac artery.  An endarterectomy was then performed with the [facility] with a hemostat used to pull a thick plug out of the left common iliac artery.  A nice feathered distal endpoint was created in the internal iliac artery and at the origin of the external iliac artery with good backbleeding seen.  The left limb of the graft was then cut and beveled to match the arteriotomy and was sewn in an end-to-side fashion with a 5-0 Prolene suture to the left distal common iliac artery and proximal external iliac artery. Flushing maneuvers were performed and flow was reestablished first to the internal and then the external iliac arteries. Excellent pulses noted in the left femoral artery at the groin as well as the left internal iliac artery beyond the anastomosis.  At this point, the IMA  was dissected free taking a ring of the tissue around the lumen to allow an easy anastomosis to the graft.  The distal aorta was then oversewn with 3-0 Prolene suture.  A side biting Satinsky clamp was then used to clamp the graft just above its bifurcation and a partial occlusion technique with palpable pulses felt in both limbs with clamping.  A graftotomy was then created on the left side of the graft.  The IMA was then sewn to the graftotomy with 6-0 Prolene sutures in a running fashion.  The vessel was flushed and de-aired prior to release of control and flow was reestablished with an excellent pulse felt in the IMA.  The retroperitoneal tissues are then irrigated with sterile saline and inspected for hemostasis  Evicel with fibrillar is placed and the retroperitoneal tissues are reapproximated using running 0 Vicryl suture. The viscera was returned to its anatomic location and subsequently the fascia is closed with looped #1 PDS skin is closed with surgical staples.  The patient was then awakened from anesthesia and taken to the recovery room in stable condition having tolerated the procedure well.   COMPLICATIONS: None  CONDITION: Kerman PasseyGood   Dedee Liss 05/22/2017 4:39 PM     This note was created with Dragon Medical transcription system. Any errors in dictation are purely unintentional.

## 2017-05-22 NOTE — Anesthesia Postprocedure Evaluation (Signed)
Anesthesia Post Note  Patient: [name]  Procedure(s) Performed: AORTA BIFEMORAL BYPASS GRAFT, Aortic bilateral, and internal ischemia (N/A ) INSERTION CENTRAL LINE ADULT Left Internal Jugular (Left Neck)  Patient location during evaluation: PACU Anesthesia Type: General Level of consciousness: awake and alert Pain management: pain level controlled Vital Signs Assessment: post-procedure vital signs reviewed and stable Respiratory status: spontaneous breathing, nonlabored ventilation, respiratory function stable and patient connected to nasal cannula oxygen Cardiovascular status: blood pressure returned to baseline and stable Postop Assessment: no apparent nausea or vomiting Anesthetic complications: no     Last Vitals:  Vitals:   05/22/17 2000 05/22/17 2100  BP: 127/87 134/87  Pulse: 90 91  Resp: 15 19  Temp: 36.7 C   SpO2: 99% 98%    Last Pain:  Vitals:   05/22/17 2131  TempSrc:   PainSc: 8                  Canaan Holzer S

## 2017-05-22 NOTE — Op Note (Signed)
OPERATIVE NOTE   PROCEDURE: 1. Aortobiiliac bypass graft with a 14 x 7 bifurcated Dacron graft 2.   Aortic endarterectomy 3.   Right common and internal iliac artery endarterectomy 4.   Left common and internal iliac artery endarterectomy 5.   Reimplantation of the IMA into aortic bypass graft  PRE-OPERATIVE DIAGNOSIS: 1.Atherosclerotic occlusive disease bilateral lower extremities with disabling claudication and rest pain of both lower extremities. 2. Aortic atherosclerosis with chronic occlusion  POST-OPERATIVE DIAGNOSIS: Same  SURGEON: Levora Dredge  CO-surgeon: Festus Barren, MD  ANESTHESIA:  general  ESTIMATED BLOOD LOSS: 1250 cc  FINDING(S): 1. Occlusion of the aorta just below the level of the renal arteries large pulsatile IMA and therefore it will be reimplanted and this is described in the body of the note atherosclerotic changes within the aorta with severe atherosclerotic changes noted at the iliac bifurcation bilaterally.  SPECIMEN(S):Plaque sent from the aorta, right iliac and left iliac systems all separately  INDICATIONS:  Patient is a 45 who presents with lifestyle limiting claudication symptoms associated with rest pain.  At this point he is afraid he will lose his job because he is "having such a difficult time walking and his legs are hurting all the time".  CT scan demonstrated aortic occlusion with bilateral common iliac artery occlusion.  There is reconstitution of the external iliac arteries bilaterally with patency down to the femoral bifurcation and beyond.  Given this finding and the severity of his symptoms aortobiiliac bypass grafting was recommended.  The risks and benefits as well as alternative therapies including intervention were reviewed in detail all questions were answered the patient agrees to proceed with surgery.  DESCRIPTION: After obtaining full informed written consent, the patient was brought back to the operating room and placed supine  upon the operating table. The patient received IV antibiotics prior to induction. After obtaining adequate anesthesia, the patient was prepped and draped in the standard fashion appropriate time out is called. Initially a central line is placed and this will be dictated as a separate note.  A midline incision is then created and the dissection carried down to expose the fascia. The peritoneal cavity is entered without difficulty just below the xiphoid process. As the incision is extended to the supraumbilical area.  The viscera was then inspected and no obvious abnormalities are identified.  A few scattered adhesions to the omentum are noted and these are lysed without difficulty.  The omentum was then reflected superiorly and the small intestine is swept into the right gutter. Small intestine was then packed away with a large laparotomy pad which is moistened with saline.  The Omni-Tract retractor was then used to help facilitate our exposure.  The retroperitoneum is then opened along the midline overlying the pulse which is palpable at the level of the duodenum. The inferior mesenteric vein is identified and skeletonized so that it is mobilized and able to be retracted superiorly and left laterally out of the field. The renal vein is then identified. At this point the Omni-Tract is repositioned to allow full exposure of the aorta.  The retroperitoneal dissection is then carried inferiorly past the palpable aortic bifurcation. Moving back superiorly the aorta is dissected circumferentially at the level of the renal vein it is soft at this level and acceptable for clamp. It is then dissected along its right side taking care not to injure the vena cava. The dissection is then carried down to the right iliac bifurcation and then the left iliac bifurcation.  A total of 6000 units of heparin was given throughout the case and the initial bolus was allowed circulate for 5 minutes. An 14 x 7 Dacron bifurcated  graft is then selected and rehydrated on the back table. With myself working on the left side and Dr. Gilda Crease working on the right side the graft will be implanted.  The aorta was clamped just below the level of the right renal and an aortotomy is made with 11 blade and extended with Potts scissors.  2 bleeding lumbar vessels were ligated with 3-0 Prolene suture.  After exposing the end of the aorta so that a proper end-to-end anastomosis could be fashioned endarterectomy was performed.  Consistent with the CT findings plaque was noted in the anterior and left lateral wall and this extended almost up to the level of the renal arteries.  Therefore once the aortic endpoint was mottled the aortic plaque required removal to allow for proper anastomosis.  This was performed under direct visualization.  Specimen was passed off the field for permanent examination.  The aorta was forward flushed. The Dacron graft was then beveled and applied in an end graft to end aortic anastomosis using running 4-0 Prolene. Flushing maneuvers were performed and the graft is clamped just below the suture line.  The aortic graft is in copiously irrigated with heparinized saline.  Attention is then turned to the right iliac bifurcation.  An arteriotomy is made with 11 blade and extended with Potts scissors.  Arteriotomy is made beginning in the distal common iliac and extending it for 5-10 mm of the external iliac artery.  Endarterectomy was then performed under direct visualization of the common iliac artery extending into the internal iliac artery.  Feathered edges obtained in the external iliac artery.  Several interrupted 7-0 tacking sutures are placed in the internal iliac artery on the right.  The right limb of the graft is then approximated to the arteriotomy trimmed to an appropriate bevel and an end graft to side iliac artery anastomosis is fashioned including the graft over the origin of the external iliac artery. 5-0 Prolene  is used to sew the anastomosis. Flushing maneuvers were performed and flow was established first to the internal and then the external iliac system. Easily palpable pulses are noted in the distal internal iliac as well as the distal external iliac arteries and also the femoral artery by palpation of the groin.  The left iliac bifurcation is then addressed. Arteriotomy is made in the distal common iliac and extended into the external iliac artery again 5-10 mm.  Endarterectomy is then performed under direct visualization of the common iliac artery as well as the external iliac artery.  Plaque is passed off the field as specimen.  Intimal edges are feathered and no tacking sutures were required.  The left limb of the bifurcated graft is then approximated to the arteriotomy and trimmed to the appropriate length and beveled.  Anastomosis is fashioned in an and graft limb to side iliac artery fashion using running 5-0 Prolene.  Flushing maneuvers were performed and flow was reestablished first to the internal and then the external iliac arteries. Excellent pulses noted in the left femoral.  Attention is then turned to the IMA.  Because this artery is so large on CAT scan as well as on direct examination I felt that this was an important arterial path that should be reimplanted.  The aorta was then incised with an 11 blade approximately 4 mm away from the origin of  the IMA and then using Potts scissors a round patch/rim of aorta was created to allow for sewing of the IMA onto the main body of the bifurcated graft.  A side-biting Satinsky clamp was then applied to the graft and a graftotomy made with an 11 blade scalpel.  After trimming the IMA it was applied and and IMA to side aortic graft fashion using running 6-0 Prolene suture.  Flushing maneuvers were performed and the suture line was completed.  After release of all clamps excellent pulse was noted in the distal IMA.  The retroperitoneal tissues are then  irrigated with sterile saline and inspected for hemostasis Evicel with Surgicel is placed and the retroperitoneal tissues are reapproximated using running 0 Vicryl suture. The viscera was returned to its anatomic location and subsequently the fascia is closed with looped #1 PDS skin is closed with surgical staples.  COMPLICATIONS: None  CONDITION: Douglas HatchetGood   Gregory Schnier 05/22/2017 5:11 PM     This note was created with Dragon Medical transcription system. Any errors in dictation are purely unintentional.

## 2017-05-22 NOTE — Progress Notes (Signed)
Pain level verbal 10 of 10  Visual 6 out of a10

## 2017-05-23 ENCOUNTER — Encounter: Payer: Self-pay | Admitting: Vascular Surgery

## 2017-05-23 DIAGNOSIS — G8918 Other acute postprocedural pain: Secondary | ICD-10-CM

## 2017-05-23 DIAGNOSIS — E875 Hyperkalemia: Secondary | ICD-10-CM

## 2017-05-23 LAB — BASIC METABOLIC PANEL
ANION GAP: 8 (ref 5–15)
Anion gap: 7 (ref 5–15)
BUN: 20 mg/dL (ref 6–20)
BUN: 22 mg/dL — ABNORMAL HIGH (ref 6–20)
CALCIUM: 8.2 mg/dL — AB (ref 8.9–10.3)
CHLORIDE: 108 mmol/L (ref 101–111)
CO2: 22 mmol/L (ref 22–32)
CO2: 21 mmol/L — AB (ref 22–32)
CREATININE: 1.02 mg/dL (ref 0.61–1.24)
CREATININE: 1.02 mg/dL (ref 0.61–1.24)
Calcium: 8.1 mg/dL — ABNORMAL LOW (ref 8.9–10.3)
Chloride: 111 mmol/L (ref 101–111)
GFR calc non Af Amer: >60 mL/min (ref >60)
GFR calc non Af Amer: >60 mL/min (ref >60)
Glucose, Bld: 152 mg/dL — ABNORMAL HIGH (ref 65–99)
Glucose, Bld: 160 mg/dL — ABNORMAL HIGH (ref 65–99)
Potassium: 6 mmol/L — ABNORMAL HIGH (ref 3.5–5.1)
Potassium: 6 mmol/L — ABNORMAL HIGH (ref 3.5–5.1)
SODIUM: 138 mmol/L (ref 135–145)
Sodium: 139 mmol/L (ref 135–145)

## 2017-05-23 LAB — CBC
HEMATOCRIT: 49 % (ref 40.0–52.0)
Hemoglobin: 16.3 g/dL (ref 13.0–18.0)
MCH: 28.8 pg (ref 26.0–34.0)
MCHC: 33.3 g/dL (ref 32.0–36.0)
MCV: 86.6 fL (ref 80.0–100.0)
Platelets: 177 10*3/uL (ref 150–440)
RBC: 5.65 MIL/uL (ref 4.40–5.90)
RDW: 14 % (ref 11.5–14.5)
WBC: 12.2 10*3/uL — ABNORMAL HIGH (ref 3.8–10.6)

## 2017-05-23 LAB — MAGNESIUM: MAGNESIUM: 1.9 mg/dL (ref 1.7–2.4)

## 2017-05-23 LAB — CBC WITH DIFFERENTIAL/PLATELET
BASOS ABS: 0 10*3/uL (ref 0–0.1)
BASOS PCT: 0 %
EOS ABS: 0 10*3/uL (ref 0–0.7)
Eosinophils Relative: 0 %
HEMATOCRIT: 49.7 % (ref 40.0–52.0)
HEMOGLOBIN: 16.6 g/dL (ref 13.0–18.0)
Lymphocytes Relative: 9 %
Lymphs Abs: 1.1 10*3/uL (ref 1.0–3.6)
MCH: 29.2 pg (ref 26.0–34.0)
MCHC: 33.3 g/dL (ref 32.0–36.0)
MCV: 87.6 fL (ref 80.0–100.0)
MONOS PCT: 9 %
Monocytes Absolute: 1.1 10*3/uL — ABNORMAL HIGH (ref 0.2–1.0)
NEUTROS ABS: 9.9 10*3/uL — AB (ref 1.4–6.5)
NEUTROS PCT: 82 %
Platelets: 205 10*3/uL (ref 150–440)
RBC: 5.68 MIL/uL (ref 4.40–5.90)
RDW: 14.5 % (ref 11.5–14.5)
WBC: 12.1 10*3/uL — AB (ref 3.8–10.6)

## 2017-05-23 LAB — POTASSIUM
Potassium: 6 mmol/L — ABNORMAL HIGH (ref 3.5–5.1)
Potassium: 4.4 mmol/L (ref 3.5–5.1)

## 2017-05-23 MED ORDER — SODIUM CHLORIDE 0.9 % IV BOLUS (SEPSIS)
500.0000 mL | Freq: Once | INTRAVENOUS | Status: AC
  Administered 2017-05-23: 500 mL via INTRAVENOUS

## 2017-05-23 MED ORDER — HYDROMORPHONE 1 MG/ML IV SOLN
INTRAVENOUS | Status: DC
  Administered 2017-05-24: 1.27 mg via INTRAVENOUS
  Administered 2017-05-24: 7.38 mg via INTRAVENOUS
  Administered 2017-05-24: 25 mg via INTRAVENOUS
  Administered 2017-05-25 (×2): 1 mg via INTRAVENOUS
  Filled 2017-05-23 (×3): qty 25

## 2017-05-23 MED ORDER — SODIUM BICARBONATE 8.4 % IV SOLN
50.0000 meq | Freq: Once | INTRAVENOUS | Status: AC
  Administered 2017-05-23: 50 meq via INTRAVENOUS
  Filled 2017-05-23: qty 50

## 2017-05-23 MED ORDER — DIPHENHYDRAMINE HCL 50 MG/ML IJ SOLN: 12.5000 mg | Freq: Four times a day (QID) | INTRAMUSCULAR | Status: DC | PRN

## 2017-05-23 MED ORDER — ONDANSETRON HCL 4 MG/2ML IJ SOLN: 4.0000 mg | Freq: Four times a day (QID) | INTRAMUSCULAR | Status: DC | PRN

## 2017-05-23 MED ORDER — DIPHENHYDRAMINE HCL 12.5 MG/5ML PO ELIX
12.5000 mg | ORAL_SOLUTION | Freq: Four times a day (QID) | ORAL | Status: DC | PRN
  Filled 2017-05-23: qty 5

## 2017-05-23 MED ORDER — NALOXONE HCL 0.4 MG/ML IJ SOLN: 0.4000 mg | INTRAMUSCULAR | Status: DC | PRN

## 2017-05-23 MED ORDER — SODIUM CHLORIDE 0.9% FLUSH: 10.0000 mL | INTRAVENOUS | Status: DC | PRN

## 2017-05-23 MED ORDER — SODIUM CHLORIDE 0.9% FLUSH: 9.0000 mL | INTRAVENOUS | Status: DC | PRN

## 2017-05-23 MED ORDER — HYDROMORPHONE HCL 1 MG/ML IJ SOLN
1.0000 mg | INTRAMUSCULAR | Status: DC | PRN
Start: 2017-05-23 — End: 2017-05-23
  Administered 2017-05-23 (×2): 2 mg via INTRAVENOUS
  Filled 2017-05-23 (×2): qty 2
  Filled 2017-05-23: qty 1

## 2017-05-23 MED ORDER — FUROSEMIDE 10 MG/ML IJ SOLN
40.0000 mg | Freq: Once | INTRAMUSCULAR | Status: AC
  Administered 2017-05-23: 40 mg via INTRAVENOUS

## 2017-05-23 MED ORDER — HYDROMORPHONE HCL 1 MG/ML IJ SOLN
1.0000 mg | Freq: Once | INTRAMUSCULAR | Status: AC
  Administered 2017-05-23: 1 mg via INTRAVENOUS

## 2017-05-23 MED ORDER — ACETAMINOPHEN 10 MG/ML IV SOLN
1000.0000 mg | Freq: Four times a day (QID) | INTRAVENOUS | Status: AC
  Administered 2017-05-23 (×3): 1000 mg via INTRAVENOUS
  Filled 2017-05-23 (×4): qty 100

## 2017-05-23 MED ORDER — DEXTROSE 50 % IV SOLN
1.0000 | Freq: Once | INTRAVENOUS | Status: AC
  Administered 2017-05-23: 50 mL via INTRAVENOUS
  Filled 2017-05-23: qty 50

## 2017-05-23 MED ORDER — INSULIN ASPART 100 UNIT/ML IV SOLN
10.0000 [IU] | Freq: Once | INTRAVENOUS | Status: AC
  Administered 2017-05-23: 10 [IU] via INTRAVENOUS
  Filled 2017-05-23: qty 0.1

## 2017-05-23 MED ORDER — FUROSEMIDE 10 MG/ML IJ SOLN
INTRAMUSCULAR | Status: AC
  Filled 2017-05-23: qty 4

## 2017-05-23 MED ORDER — HYDROMORPHONE 1 MG/ML IV SOLN: INTRAVENOUS | Status: DC

## 2017-05-23 MED ORDER — DEXTROSE-NACL 5-0.45 % IV SOLN
INTRAVENOUS | Status: DC
  Administered 2017-05-23 (×2): via INTRAVENOUS

## 2017-05-23 MED ORDER — HYDROMORPHONE 1 MG/ML IV SOLN
INTRAVENOUS | Status: DC
  Administered 2017-05-23: 25 mg via INTRAVENOUS
  Filled 2017-05-23: qty 25

## 2017-05-23 NOTE — Progress Notes (Signed)
Patient check  S: Pain is under relatively good control.  He is now complaining of sore throat.  O: Sinus tach with blood pressure of 130/60 Urine output greater than 1 L for the last shift Abdomen appropriate, palpable dorsalis pedis pulses bilaterally K+ now down to 4.4 BUN and creatinine are normal  A: Status post aortobiiliac bypass grafting pain under good control potassium now within the normal range urine output excellent  P: I will add Chloraseptic spray for his sore throat.  We will continue as ordered as he is doing better Foley can be removed in 2-3 days once he begins his diuresis.  Continue NG tube for now.  Follow labs.

## 2017-05-23 NOTE — Progress Notes (Signed)
[facility]  Daily Progress Note   Subjective: 1 Day Post-Op: Aortobiiliac bypass graft with a 14 x 7 bifurcated Dacron graft, Aortic endarterectomy, Right common and internal iliac artery endarterectomy, Left common and internal iliac artery endarterectomy, Reimplantation of the IMA into aortic bypass graft  Patient complaining of incisional pain.  Patient denies any nausea vomiting.  Patient denies any flatus or bowel movement.  Objective: Vitals:   05/23/17 1100 05/23/17 1200 05/23/17 1300 05/23/17 1400  BP: (!) 123/94 (!) 131/93 122/90 (!) 125/95  Pulse: (!) 123 (!) 114 (!) 108 (!) 119  Resp: 13 15 (!) 8 (!) 21  Temp:      TempSrc:      SpO2: 95% 97% 95% 95%  Weight:      Height:        Intake/Output Summary (Last 24 hours) at 05/23/2017 1532 Last data filed at 05/23/2017 1200 Gross per 24 hour  Intake 2862.5 ml  Output 2590 ml  Net 272.5 ml   Physical Exam: A&Ox3, NAD CV: RRR Pulmonary: CTA Bilaterally Abdomen: NG tube in place.  Soft, tender to palpation.  Partly distended.  Lower dressing intact with minimal bloody drainage noted. Foley: Intact. draining clear urine. Vascular:  Bilateral lower extremity: Thigh is soft, calves soft, nontender to palpation.  Extremities warm down to toes.  Good capillary refill.   Laboratory: CBC    Component Value Date/Time   WBC 12.1 (H) 05/23/2017 0445   HGB 16.6 05/23/2017 0445   HCT 49.7 05/23/2017 0445   PLT 205 05/23/2017 0445   BMET    Component Value Date/Time   NA 138 05/23/2017 0443   NA 141 04/11/2017 0912   K 4.4 05/23/2017 1253   CL 108 05/23/2017 0443   CO2 22 05/23/2017 0443   GLUCOSE 152 (H) 05/23/2017 0443   BUN 22 (H) 05/23/2017 0443   BUN 11 04/11/2017 0912   CREATININE 1.02 05/23/2017 0443   CALCIUM 8.1 (L) 05/23/2017 0443   GFRNONAA >60 05/23/2017 0443   GFRAA >60 05/23/2017 0443   Assessment/Planning: 52 year old male status post an aorto by iliac bypass - stable 1) NPO /  NG tube until bowel function starts to return. 2) Pain control 3) Foley will be removed tomorrow a.m. 4) PT / OT  Cleda DaubKimberly Jeshua Ransford PA-C 05/23/2017 3:32 PM

## 2017-05-23 NOTE — Progress Notes (Signed)
OT Cancellation Note  Patient Details Name: Douglas ShoneFarzad Hankerson MRN: [id number] DOB: [birth year]   Cancelled Treatment:    Reason Eval/Treat Not Completed: Medical issues which prohibited therapy. Order received, chart reviewed. Pt noted to have potassium of 6.0. BP and PR elevated. Due to elevated potassium and per therapy protocol, pt contraindicated for therapy at this time. Will continue to follow acutely and re-attempt OT evaluation at later date/time as pt is medically appropriate.   Richrd PrimeJamie Stiller, MPH, MS, OTR/L ascom [phone] 05/23/17, 8:42 AM

## 2017-05-23 NOTE — Progress Notes (Signed)
PT Cancellation Note  Patient Details Name: Douglas ShoneFarzad Moret MRN: [id number] DOB: 03-14-1965   Cancelled Treatment:    Reason Eval/Treat Not Completed: Medical issues which prohibited therapy.  PT order received.  Pt noted with elevated potassium to 6.0 this morning.  Per PT guidelines for elevated potassium, will hold PT at this time and re-attempt PT evaluation at a later date/time as medically appropriate.  Hendricks LimesEmily Goldye Tourangeau, PT 05/23/17, 8:51 AM [phone]

## 2017-05-23 NOTE — Progress Notes (Addendum)
Alert and oriented. Drowsy at intervals. On PCA dilaudid with basal rate 0.5 and bolus dose rate 0.3. Lockout interval 8 min. Max one hour dose 1.25. Pt has met one hour limit several times today. Low resp rate  6/min.Marland Kitchen He pulls 1150 ml on incentive spirometer. Lungs clear and diminished. ST on monitor. No fever. BP good. Aline Dc'd this am. Abdominal incision unchanged with old blood to honeycomb dressing. Pain rated an "8" most of day. States pain is at abdominal incision. LE's warm, with good cap refill and palpable pulses. He denies leg pain.  Bowel sounds very faint and hypoactive. NG to LWIS. PT assisted getting him up to chair. He moves well through the pain. After sitting 8mn, he walked from chair to nursing station and back to bed using a rolling walker. HR to 138/min with activity. Elevated K+ this am returned to normal with IV lasix.

## 2017-05-23 NOTE — Progress Notes (Signed)
No apparent distress Complains of abdominal pain. Denies nausea and vomiting.  Vitals:   05/23/17 0700 05/23/17 0734 05/23/17 0800 05/23/17 0900  BP: (!) 134/92  133/85 127/84  Pulse: (!) 107  (!) 115 (!) 112  Resp: 11  (!) 8 11  Temp:  98.6 F (37 C)    TempSrc:  Oral    SpO2: 97%  94% 96%  Weight:      Height:       Gen: NAD HEENT: NCAT, sclera white Neck: No JVD Lungs: breath sounds full, no wheezes or other adventitious sounds Cardiovascular: RRR, no murmurs Abdomen: Firm, diffusely tender, diminished BS Ext: without clubbing, cyanosis, edema Neuro: grossly intact Skin: Limited exam, no lesions noted  BMP Latest Ref Rng & Units 05/23/2017 05/23/2017 05/23/2017  Glucose 65 - 99 mg/dL - 846(N152(H) 629(B160(H)  BUN 6 - 20 mg/dL - 28(U22(H) 20  Creatinine 0.61 - 1.24 mg/dL - 1.321.02 4.401.02  BUN/Creat Ratio 9 - 20 - - -  Sodium 135 - 145 mmol/L - 138 139  Potassium 3.5 - 5.1 mmol/L 6.0(H) 6.0(H) 6.0(H)  Chloride 101 - 111 mmol/L - 108 111  CO2 22 - 32 mmol/L - 22 21(L)  Calcium 8.9 - 10.3 mg/dL - 8.1(L) 8.2(L)    CBC Latest Ref Rng & Units 05/23/2017 05/23/2017 05/21/2017  WBC 3.8 - 10.6 K/uL 12.1(H) 12.2(H) 4.7  Hemoglobin 13.0 - 18.0 g/dL 10.216.6 72.516.3 36.615.4  Hematocrit 40.0 - 52.0 % 49.7 49.0 44.8  Platelets 150 - 440 K/uL 205 177 222     CXR: No new film  IMPRESSION: Peripheral arterial disease Status post aorto iliac bypass graft-postop day 1 Postop pain Hyperkalemia, unexplained Mild AKI  PLAN/REC: Post op mgmt per Dr Gilda CreaseSchnier  Cont PCA hydromorphone Furosemide X 1 dose Recheck BMET @ 12N today Avoid ACEIs, ARBs and KCl supplementation   Billy Fischeravid Altheria Shadoan, MD PCCM service Mobile [phone] Pager [phone] 05/23/2017 11:39 AM

## 2017-05-23 NOTE — Evaluation (Signed)
Occupational Therapy Evaluation Patient Details Name: [name] MRN: [id number] DOB: [birth year] Today's Date: 05/23/2017    History of Present Illness 52yo male s/p aorto by iliac bypass, POD#1 for OT evaluation. PMHX including L TKA.   Clinical Impression   Pt seen for OT evaluation this date. 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 Pt lives with his spouse and adult son in a 2 story home with plans to bring bed down to living room and recover on main floor of the house for approx 1 month, where he will have access to a full bath and will have assist from family. Pt speaks Farsi/Persian and also some [place]. Initially [employer] interpreter utilized [employer], [phone]) but pt declined halfway through session, RN present t/o. Pt presents at time of evaluation with significant abdominal/surgical site pain (8/10 at rest, >10/10 pain with any movement). HR start of session 115, increasing to 132 with mobility, O2 and BP stable, HR down to 117 at end of session. Pt utilized PCA for pain control x2 during mobility. Pt required min-mod assist and +2 for equipment/safety during supine>sit EOB due to increased pain with abdominal activation and min guard and +2 for equipment/safety for transfers and ambulating <5 steps to recliner with RW. Pt will require assist for LB ADL tasks due to abdominal strength and pain deficits following surgery. Pt will benefit from skilled OT services to address noted impairments and functional deficits in order to maximize return to PLOF and minimize risk of falls or injury. Pending pt's ability to perform stairs with PT, recommend pt return home with 24/7 sup/assist from family initially. Will continue to progress while in the hospital. Do not anticipate follow up needs for OT following hospitalization.    Follow Up Recommendations  Supervision/Assistance - 24 hour     Equipment Recommendations  3 in 1 bedside commode    Recommendations for Other Services       Precautions / Restrictions Precautions Precautions: Fall;Other (comment) Precaution Comments: no lifting >10lbs Restrictions Weight Bearing Restrictions: No      Mobility Bed Mobility Overal bed mobility: Needs Assistance Bed Mobility: Supine to Sit     Supine to sit: Min assist;Mod assist;+2 for safety/equipment;HOB elevated     General bed mobility comments: min-mod assist for back support and UE support for pt to help pull himself towards EOB due to impaired abdominal strength/incision site pain   Transfers Overall transfer level: Needs assistance Equipment used: Rolling walker (2 wheeled) Transfers: Sit to/from Stand Sit to Stand: Min guard;+2 safety/equipment         General transfer comment: no LOB, HR increased to low 130's during mobility (HR 115 at start of session at rest). RN aware    Balance Overall balance assessment: Needs assistance Sitting-balance support: Bilateral upper extremity supported Sitting balance-Leahy Scale: Fair Sitting balance - Comments: fair+   Standing balance support: Bilateral upper extremity supported Standing balance-Leahy Scale: Fair                             ADL either performed or assessed with clinical judgement   ADL Overall ADL's : Needs assistance/impaired Eating/Feeding: Sitting;NPO;Independent Eating/Feeding Details (indicate cue type and reason): ice chips, sips of water Grooming: Sitting;Set up   Upper Body Bathing: Sitting;Minimal assistance   Lower Body Bathing: Sit to/from stand;Moderate assistance;Maximal assistance Lower Body Bathing Details (indicate  cue type and reason): secondary to abdominal pain Upper Body Dressing : Sitting;Minimal assistance   Lower Body Dressing: Sit to/from stand;Maximal assistance;Moderate assistance Lower Body Dressing Details (indicate cue type and reason):  secondary to abdominal pain Toilet Transfer: BSC;RW;Ambulation;+2 for safety/equipment;Min guard           Functional mobility during ADLs: Min guard;Rolling walker;+2 for safety/equipment       Vision Baseline Vision/History: Wears glasses Wears Glasses: Reading only Patient Visual Report: No change from baseline       Perception     Praxis      Pertinent Vitals/Pain Pain Assessment: 0-10 Pain Score: 10-Worst pain ever Pain Location: >10/10 with bed mobility and transitional movements, 8/10 at rest -- abdominal incision Pain Descriptors / Indicators: Aching;Grimacing;Moaning Pain Intervention(s): Limited activity within patient's tolerance;Monitored during session;PCA encouraged;Repositioned     Hand Dominance Right   Extremity/Trunk Assessment Upper Extremity Assessment Upper Extremity Assessment: Overall WFL for tasks assessed   Lower Extremity Assessment Lower Extremity Assessment: (able to perform SLR bilaterally without difficulty, difficult to formally assess due to abdominal pain with activation of BLE)   Cervical / Trunk Assessment Cervical / Trunk Assessment: Normal   Communication Communication Communication: No difficulties;Prefers language other than English;Interpreter utilized(initially used [employer] interpreter [place], [id number]), but pt verbally declined halfway through session. RN witnessed.)   Cognition Arousal/Alertness: Awake/alert Behavior During Therapy: WFL for tasks assessed/performed Overall Cognitive Status: Within Functional Limits for tasks assessed                                     General Comments       Exercises Other Exercises Other Exercises: Pt educated in proper home 1st floor set up to support recovery and maximize his independence and safety   Shoulder Instructions      Home Living Family/patient expects to be discharged to:: Private residence Living Arrangements: Spouse/significant  other;Children(25yo son) Available Help at Discharge: Family;Available 24 hours/day Type of Home: House Home Access: Stairs to enter [employer] of Steps: 5 Entrance Stairs-Rails: None Home Layout: Two level;Full bath on main level;Able to live on main level with bedroom/bathroom;Bed/bath upstairs Alternate Level Stairs-Number of Steps: 16 steps to 2nd floor   Bathroom Shower/Tub: Producer, television/film/video: Standard     Home Equipment: Shower seat - built in          Prior Functioning/Environment Level of Independence: Independent        Comments: Pt indep with limited mobility 2:2 pain (can walk approx 20 minutes w/o pain, no pain in standing); indep with ADL, IADL including driving and working as a Geographical information systems officer        OT Problem List: Decreased knowledge of use of DME or AE;Pain;Impaired balance (sitting and/or standing);Decreased activity tolerance      OT Treatment/Interventions: Self-care/ADL training;Balance training;Therapeutic exercise;Therapeutic activities;DME and/or AE instruction;Patient/family education    OT Goals(Current goals can be found in the care plan section) Acute Rehab OT Goals Patient Stated Goal: to go home and recover OT Goal Formulation: With patient Time For Goal Achievement: 06/06/17 Potential to Achieve Goals: Good ADL Goals Pt Will Perform Lower Body Dressing: with supervision;with adaptive equipment;sit to/from stand(AE as needed)  OT Frequency: Min 1X/week   Barriers to D/C:            Co-evaluation  AM-PAC PT "6 Clicks" Daily Activity     Outcome Measure Help from another person eating meals?: None Help from another person taking care of personal grooming?: None Help from another person toileting, which includes using toliet, bedpan, or urinal?: A Little Help from another person bathing (including washing, rinsing, drying)?: A Lot Help from another person to put on and taking off  regular upper body clothing?: A Little Help from another person to put on and taking off regular lower body clothing?: A Lot 6 Click Score: 18   End of Session Equipment Utilized During Treatment: Gait belt;Rolling walker  Activity Tolerance: Patient tolerated treatment well Patient left: with call bell/phone within reach;in chair;with nursing/sitter in room  OT Visit Diagnosis: Other abnormalities of gait and mobility (R26.89);Pain Pain - Right/Left: (abdominal surgical pain)                Time: 1445-1535 OT Time Calculation (min): 50 min Charges:  OT General Charges $OT Visit: 1 Visit OT Evaluation $OT Eval Low Complexity: 1 Low OT Treatments $Self Care/Home Management : 23-37 mins  Richrd PrimeJamie Stiller, MPH, MS, OTR/L ascom [phone] 05/23/17, 4:40 PM

## 2017-05-24 DIAGNOSIS — R000 Tachycardia, unspecified: Secondary | ICD-10-CM

## 2017-05-24 DIAGNOSIS — E861 Hypovolemia: Secondary | ICD-10-CM

## 2017-05-24 LAB — CBC
HEMATOCRIT: 41.6 % (ref 40.0–52.0)
HEMOGLOBIN: 14 g/dL (ref 13.0–18.0)
MCH: 29.1 pg (ref 26.0–34.0)
MCHC: 33.8 g/dL (ref 32.0–36.0)
MCV: 86.2 fL (ref 80.0–100.0)
Platelets: 152 10*3/uL (ref 150–440)
RBC: 4.82 MIL/uL (ref 4.40–5.90)
RDW: 14.3 % (ref 11.5–14.5)
WBC: 6.6 10*3/uL (ref 3.8–10.6)

## 2017-05-24 LAB — BASIC METABOLIC PANEL
Anion gap: 7 (ref 5–15)
BUN: 20 mg/dL (ref 6–20)
CHLORIDE: 104 mmol/L (ref 101–111)
CO2: 26 mmol/L (ref 22–32)
Calcium: 8.3 mg/dL — ABNORMAL LOW (ref 8.9–10.3)
Creatinine, Ser: 0.82 mg/dL (ref 0.61–1.24)
GFR calc Af Amer: >60 mL/min (ref >60)
GLUCOSE: 129 mg/dL — AB (ref 65–99)
POTASSIUM: 4.1 mmol/L (ref 3.5–5.1)
Sodium: 137 mmol/L (ref 135–145)

## 2017-05-24 LAB — SURGICAL PATHOLOGY

## 2017-05-24 MED ORDER — KETOROLAC TROMETHAMINE 30 MG/ML IJ SOLN
30.0000 mg | Freq: Four times a day (QID) | INTRAMUSCULAR | Status: AC | PRN
  Administered 2017-05-26 (×2): 30 mg via INTRAVENOUS
  Filled 2017-05-24 (×2): qty 1

## 2017-05-24 NOTE — Evaluation (Signed)
Physical Therapy Evaluation Patient Details Name: [name] MRN: [id number] DOB: 1965/12/15 Today's Date: 05/24/2017   History of Present Illness  Pt is a 52 y.o. 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 Pt had surgery 05/22/17 Aortic endarterectomy, Aortobiiliac bypass, Right common and internal iliac artery endarterectomy, Left common and internal iliac artery endarterectomy, Reimplantation of the IMA into the bypass graft.  Pt seen and evaulated by PT 05/24/17, POD#2. PMH inludes leg pain, LBP, knee surgeries (2006), colonoscopy 2019, kidney stone surgery.    Clinical Impression  Pt demonstrated transfers CGA to Min Assist; Pt limited by abdominal incision pain 8/10 (see mobility for details). Pt demonstrated ambulation 104 ft w/ RW CGA x1, SBA x1 to manage lines (see ambulation for details). Pt tolerated session moderately well with no increase in pain 8/10 (beginning of session) and 5/10 (end of session); Pt controls pain w/ PCA (although Pt did not press PCA during session). Pt would benefit from skilled PT to progress ambulation, stair climbing, and activity tolerance. Recommend HHPT after discharge from acute hospitalization.    Follow Up Recommendations Home health PT    Equipment Recommendations  Rolling walker with 5" wheels    Recommendations for Other Services       Precautions / Restrictions Precautions Precautions: Fall;Other (comment) Precaution Comments: no lifting >10lbs; long abdominal incision Restrictions Weight Bearing Restrictions: No      Mobility  Bed Mobility Overal bed mobility: Needs Assistance Bed Mobility: Supine to Sit     Supine to sit: Min assist;+2 for safety/equipment;HOB elevated     General bed mobility comments: assist provided  for UE support to bring trunk forward and adjust hips square to bed; Pt limited by adominal incision site pain.  Transfers Overall transfer level: Needs assistance Equipment used: Rolling walker (2 wheeled) Transfers: Sit to/from Stand Sit to Stand: Min guard;+2 safety/equipment         General transfer comment: Pt demonstrated stability and safe use of RW with transfer.  Ambulation/Gait Ambulation/Gait assistance: Min guard Ambulation Distance (Feet): 104 Feet Assistive device: Rolling walker (2 wheeled) Gait Pattern/deviations: WFL(Within Functional Limits) Gait velocity: decreased   General Gait Details: Pt demonstrated good stability and safe use of RW. Pt reported that he was only limited by adominal incision pain.  Stairs            Wheelchair Mobility    Modified Rankin (Stroke Patients Only)       Balance Overall balance assessment: Needs assistance Sitting-balance support: Bilateral upper extremity supported Sitting balance-Leahy Scale: Good Sitting balance - Comments: Pt demonstrated good stability while sitting EOB and managing clothing reaching within BOS.   Standing balance support: Bilateral upper extremity supported Standing balance-Leahy Scale: Fair                               Pertinent Vitals/Pain Pain Assessment: 0-10 Pain Score: 5 (end of session, sitting in chair) Pain Location: Abdominal incision(end of session) Pain Descriptors / Indicators: Aching;Grimacing;Operative site guarding Pain Intervention(s): Limited activity within patient's tolerance;Monitored during session;Premedicated before session;Repositioned    Home Living Family/patient expects to be discharged to:: Private residence Living Arrangements: Spouse/significant other;Children Available Help at Discharge: Family;Available 24 hours/day Type of Home: House Home Access: Stairs to enter Entrance Stairs-Rails: None Entrance Stairs-Number of Steps: 5  Home  Layout: Able to live on main level with bedroom/bathroom;Two level Home Equipment: Shower seat - built in      Prior Function Level of Independence: Independent         Comments: Pt reports he was independent with ADLs prior to hospitalization and that he works as a Merchandiser, retailsupervisor at [employer] and drove himself to work daily.     Hand Dominance   Dominant Hand: Right    Extremity/Trunk Assessment   Upper Extremity Assessment Upper Extremity Assessment: Overall WFL for tasks assessed;RUE deficits/detail;LUE deficits/detail RUE Deficits / Details: Pt demonstrated BUE AROM WFL and BUE strength at least 3/5 demonstrated by fully raising arms overhead. Deferred MMT due to high level of pain 8/10 at abdominal incision site. RUE: Unable to fully assess due to pain LUE Deficits / Details: Pt demonstrated BUE AROM WFL and BUE strength at least 3/5 demonstrated by fully raising arms overhead. Deferred MMT due to high level of pain 8/10 at abdominal incision site. LUE: Unable to fully assess due to pain    Lower Extremity Assessment Lower Extremity Assessment: Overall WFL for tasks assessed;RLE deficits/detail;LLE deficits/detail RLE Deficits / Details: Pt demonstrated BLE AROM WFL and BUE strength at least 3/5 demonstrated by fully raising knees to chest. Deferred MMT due to high level of pain 8/10 at abdominal incision site. RLE: Unable to fully assess due to pain LLE Deficits / Details: Pt demonstrated BLE AROM WFL and BUE strength at least 3/5 demonstrated by fully raising knees to chest. Deferred MMT due to high level of pain 8/10 at abdominal incision site. LLE: Unable to fully assess due to pain    Cervical / Trunk Assessment Cervical / Trunk Assessment: Normal  Communication   Communication: No difficulties;Prefers language other than English(Pt was offered use of interpreter and Pt denied use, stating that he felt comfortable with English.  Therapist discussed w/ patient that he could  request interpreter at any time during session if he decided that he needed one.)  Cognition Arousal/Alertness: Awake/alert Behavior During Therapy: WFL for tasks assessed/performed Overall Cognitive Status: Within Functional Limits for tasks assessed                                 General Comments: [name]      General Comments General comments (skin integrity, edema, etc.): Abdominal dressings in place and clean end of session.    Exercises     Assessment/Plan    PT Assessment Patient needs continued PT services  PT Problem List Decreased strength;Decreased activity tolerance;Decreased mobility;Decreased knowledge of use of DME;Pain       PT Treatment Interventions DME instruction;Gait training;Stair training;Functional mobility training;Therapeutic activities;Therapeutic exercise;Balance training    PT Goals (Current goals can be found in the Care Plan section)  Acute Rehab PT Goals Patient Stated Goal: To go home PT Goal Formulation: With patient Time For Goal Achievement: 06/07/17 Potential to Achieve Goals: Good    Frequency Min 2X/week   Barriers to discharge        Co-evaluation               AM-PAC PT "6 Clicks" Daily Activity  Outcome Measure Difficulty turning over in bed (including adjusting bedclothes, sheets and blankets)?: A Lot Difficulty moving from lying on back to sitting on the side of the bed? : Unable Difficulty sitting down on and standing up from a chair with arms (e.g.,  wheelchair, bedside commode, etc,.)?: Unable Help needed moving to and from a bed to chair (including a wheelchair)?: A Little Help needed walking in hospital room?: A Little Help needed climbing 3-5 steps with a railing? : A Lot 6 Click Score: 12    End of Session Equipment Utilized During Treatment: Gait belt;Oxygen Activity Tolerance: Patient limited by pain Patient left: in chair;with call bell/phone within reach(chair alarm not available in CCU  room; RN notified.) Nurse Communication: Mobility status PT Visit Diagnosis: Other abnormalities of gait and mobility (R26.89);Pain;Muscle weakness (generalized) (M62.81);Difficulty in walking, not elsewhere classified (R26.2) Pain - Right/Left: (midline abdomen) Pain - part of body: (adominal incision site)    Time: 7829-5621 PT Time Calculation (min) (ACUTE ONLY): 40 min   Charges:         PT G Codes:        Danyele Smejkal Mondrian-Pardue, SPT 05/24/2017, 10:59 AM

## 2017-05-24 NOTE — Progress Notes (Signed)
No apparent distress Complains of abdominal pain. Denies nausea and vomiting. Still very tachycardiac Vitals:   05/24/17 0600 05/24/17 0700 05/24/17 0800 05/24/17 0810  BP: 121/84 123/80 131/82   Pulse: (!) 115 (!) 117 (!) 118 (!) 112  Resp: 10 (!) 9 17 14   Temp:    (!) 97.5 F (36.4 C)  TempSrc:    Oral  SpO2: 98% 97% 98% 98%  Weight:      Height:       Gen: NAD HEENT: NCAT, sclera white Neck: No JVD Lungs: breath sounds full, no wheezes or other adventitious sounds Cardiovascular: ST, no murmurs Abdomen: Firm, diffusely tender, diminished BS Ext: without clubbing, cyanosis, edema Neuro: grossly intact Skin: Limited exam, no lesions noted  BMP Latest Ref Rng & Units 05/24/2017 05/23/2017 05/23/2017  Glucose 65 - 99 mg/dL 409(W129(H) - -  BUN 6 - 20 mg/dL 20 - -  Creatinine 1.190.61 - 1.24 mg/dL 1.470.82 - -  BUN/Creat Ratio 9 - 20 - - -  Sodium 135 - 145 mmol/L 137 - -  Potassium 3.5 - 5.1 mmol/L 4.1 4.4 6.0(H)  Chloride 101 - 111 mmol/L 104 - -  CO2 22 - 32 mmol/L 26 - -  Calcium 8.9 - 10.3 mg/dL 8.3(L) - -    CBC Latest Ref Rng & Units 05/24/2017 05/23/2017 05/23/2017  WBC 3.8 - 10.6 K/uL 6.6 12.1(H) 12.2(H)  Hemoglobin 13.0 - 18.0 g/dL 82.914.0 56.216.6 13.016.3  Hematocrit 40.0 - 52.0 % 41.6 49.7 49.0  Platelets 150 - 440 K/uL 152 205 177     CXR: No new film  IMPRESSION: 1.Peripheral arterial disease 2. Status post aorto iliac bypass graft-postop day 2 3. Postop pain- on Dilaudid PCA 4. Hyperkalemia- resolved 5. Mild AKI- resolved 6. Sinus Tachycardia most likely from hypovolemia  PLAN/REC: Post op mgmt per Dr Gilda CreaseSchnier  Cont PCA hydromorphone; needs cautious as patient not on bowel regimen and at risk for ileus, continue IVF Avoid ACEIs, ARBs and KCl supplementation Early mobilization  Jackson LatinoKarol Johnie Stadel, MD PCCM service Pager [phone] 05/24/2017 9:59 AM

## 2017-05-24 NOTE — Progress Notes (Signed)
[facility]  Daily Progress Note   Subjective: 2 Days Post-Op: Aortobiiliac bypass graftwith a 14 x 7 bifurcated Dacron graft, Aortic endarterectomy, Right common and internal iliac artery endarterectomy, Left common and internal iliac artery endarterectomy, Reimplantation of the IMA into aortic bypass graft  Patient can use to complain of incisional pain.  Denies any flatus/bowel movement.  Denies any nausea/vomiting.  Patient is asking to eat stating he is hungry.  Has been out of bed to chair.  Objective: Vitals:   05/24/17 1642 05/24/17 1700 05/24/17 1728 05/24/17 1800  BP:  112/79  110/70  Pulse:  (!) 121 (!) 126 (!) 120  Resp: 14 12 19 15   Temp:   98.7 F (37.1 C)   TempSrc:   Oral   SpO2: 95% 94% 96% 93%  Weight:      Height:        Intake/Output Summary (Last 24 hours) at 05/24/2017 1906 Last data filed at 05/24/2017 1859 Gross per 24 hour  Intake 2565.39 ml  Output 2935 ml  Net -369.61 ml   Physical Exam: A&Ox3, NAD NG: Minimal bilious fluid CV: RRR Pulmonary: CTA Bilaterally Abdomen: Soft, moderate distention, moderately tender to palpation.  Decreased bowel sounds.  Or dressing intact clean and dry. Foley: Draining clear urine Vascular:  Bilateral lower extremity warm. +DP pulses Laboratory: CBC    Component Value Date/Time   WBC 6.6 05/24/2017 0532   HGB 14.0 05/24/2017 0532   HCT 41.6 05/24/2017 0532   PLT 152 05/24/2017 0532   BMET    Component Value Date/Time   NA 137 05/24/2017 0532   NA 141 04/11/2017 0912   K 4.1 05/24/2017 0532   CL 104 05/24/2017 0532   CO2 26 05/24/2017 0532   GLUCOSE 129 (H) 05/24/2017 0532   BUN 20 05/24/2017 0532   BUN 11 04/11/2017 0912   CREATININE 0.82 05/24/2017 0532   CALCIUM 8.3 (L) 05/24/2017 0532   GFRNONAA >60 05/24/2017 0532   GFRAA >60 05/24/2017 0532   Assessment/Planning: 51 year 2 Days Post-Op: Aortobiiliac bypass - stable 1) Kidney function normalized - will start some  toradol for better pain control 2) D/C foley in AM 3) D/C NG when bowel function returns 4) PT/ OT   Cleda DaubKimberly Azucena Dart PA-C 05/24/2017 7:06 PM

## 2017-05-25 LAB — CBC
HCT: 34.9 % — ABNORMAL LOW (ref 40.0–52.0)
Hemoglobin: 11.8 g/dL — ABNORMAL LOW (ref 13.0–18.0)
MCH: 29.3 pg (ref 26.0–34.0)
MCHC: 33.7 g/dL (ref 32.0–36.0)
MCV: 87.1 fL (ref 80.0–100.0)
Platelets: 140 10*3/uL — ABNORMAL LOW (ref 150–440)
RBC: 4.01 MIL/uL — ABNORMAL LOW (ref 4.40–5.90)
RDW: 14.4 % (ref 11.5–14.5)
WBC: 5.5 10*3/uL (ref 3.8–10.6)

## 2017-05-25 LAB — BASIC METABOLIC PANEL
Anion gap: 6 (ref 5–15)
BUN: 16 mg/dL (ref 6–20)
CALCIUM: 8.2 mg/dL — AB (ref 8.9–10.3)
CO2: 28 mmol/L (ref 22–32)
CREATININE: 0.57 mg/dL — AB (ref 0.61–1.24)
Chloride: 106 mmol/L (ref 101–111)
GFR calc non Af Amer: >60 mL/min (ref >60)
Glucose, Bld: 103 mg/dL — ABNORMAL HIGH (ref 65–99)
Potassium: 3.7 mmol/L (ref 3.5–5.1)
SODIUM: 140 mmol/L (ref 135–145)

## 2017-05-25 LAB — MAGNESIUM: MAGNESIUM: 1.9 mg/dL (ref 1.7–2.4)

## 2017-05-25 MED ORDER — HYDROMORPHONE HCL 1 MG/ML IJ SOLN
0.5000 mg | INTRAMUSCULAR | Status: DC | PRN
  Administered 2017-05-26 – 2017-05-28 (×11): 0.5 mg via INTRAVENOUS
  Filled 2017-05-25 (×11): qty 0.5

## 2017-05-25 MED ORDER — MAGNESIUM SULFATE 2 GM/50ML IV SOLN
2.0000 g | Freq: Once | INTRAVENOUS | Status: AC
  Administered 2017-05-25: 2 g via INTRAVENOUS
  Filled 2017-05-25: qty 50

## 2017-05-25 MED ORDER — HYDROMORPHONE 1 MG/ML IV SOLN
INTRAVENOUS | Status: DC
  Administered 2017-05-25: 1.81 mg via INTRAVENOUS
  Administered 2017-05-25: 1.49 mg via INTRAVENOUS
  Administered 2017-05-25: 1.99 mg via INTRAVENOUS
  Administered 2017-05-25: 3.33 mg via INTRAVENOUS
  Filled 2017-05-25 (×2): qty 25

## 2017-05-25 MED ORDER — POTASSIUM CHLORIDE 10 MEQ/100ML IV SOLN
10.0000 meq | INTRAVENOUS | Status: AC
Start: 2017-05-25 — End: 2017-05-25
  Administered 2017-05-25 (×3): 10 meq via INTRAVENOUS
  Filled 2017-05-25 (×3): qty 100

## 2017-05-25 NOTE — Progress Notes (Signed)
MEDICATION RELATED CONSULT NOTE - INITIAL   Pharmacy Consult for electrolyte management Indication: hypokalemia  No Known Allergies  Patient Measurements: Height: 5\' 5"  (165.1 cm) Weight: 162 lb 11.2 oz (73.8 kg) IBW/kg (Calculated) : 61.5 Adjusted Body Weight:   Vital Signs: Temp: 98.5 F (36.9 C) (03/23 0800) Temp Source: Axillary (03/23 0800) BP: 120/71 (03/23 0800) Pulse Rate: 114 (03/23 0800) Intake/Output from previous day: 03/22 0701 - 03/23 0700 In: 1858.9 [I.V.:1858.9] Out: 3045 [Urine:1145; Emesis/NG output:1900] Intake/Output from this shift: Total I/O In: 430 [I.V.:400; NG/GT:30] Out: -   Labs: Recent Labs    05/23/17 0443 05/23/17 0445 05/24/17 0532 05/25/17 0414  WBC  --  12.1* 6.6 5.5  HGB  --  16.6 14.0 11.8*  HCT  --  49.7 41.6 34.9*  PLT  --  205 152 140*  CREATININE 1.02  --  0.82 0.57*  MG 1.9  --   --  1.9   Estimated Creatinine Clearance: 101.4 mL/min (A) (by C-G formula based on SCr of 0.57 mg/dL (L)).   Microbiology: Recent Results (from the past 720 hour(s))  Surgical pcr screen     Status: None   Collection Time: 05/21/17  2:49 PM  Result Value Ref Range Status   MRSA, PCR NEGATIVE NEGATIVE Final   Staphylococcus aureus NEGATIVE NEGATIVE Final    Comment: (NOTE) The Xpert SA Assay (FDA approved for NASAL specimens in patients 53 years of age and older), is one component of a comprehensive surveillance program. It is not intended to diagnose infection nor to guide or monitor treatment. Performed at [facility], [address] [place], [place] [address]     Medical History: Past Medical History:  Diagnosis Date  . Leg pain   . Low back pain 04/04/2017  . Patient denies medical problems     Medications:  Infusions:  . sodium chloride 100 mL/hr at 05/25/17 0819  . sodium chloride    . magnesium sulfate 1 - 4 g bolus IVPB    . magnesium sulfate 1 - 4 g bolus IVPB    . potassium chloride      Assessment: 52  yom with PVD now s/p aortoiliac bypass graft. Patient was hyperkalemic on admission. K has now normalized and creatinine clearance is improving. Pharmacy consulted to manage electrolytes - specifically to keep potassium above 4.   Goal of Therapy:  K 4 to 5  Plan:  Magnesium is being replaced this morning. Patient is still NPO so we will proceed with IV potassium. Dose fairly conservatively to begin due to recent hyperkalemia. Give potassium chloride 10 mEq IV Q1H x 3 doses this morning and recheck electrolytes tomorrow with AM labs.  Carola FrostNathan A Danikah Budzik, Pharm.D., BCPS Clinical Pharmacist 05/25/2017,10:10 AM

## 2017-05-25 NOTE — Progress Notes (Signed)
No apparent distress Complains of abdominal pain. Denies nausea and vomiting. Less tachycardiac today Vitals:   05/25/17 0600 05/25/17 0700 05/25/17 0800 05/25/17 0820  BP: (!) 116/54 115/64 120/71   Pulse: (!) 117 (!) 113 (!) 114   Resp: (!) 21 19 17 19   Temp:   98.5 F (36.9 C)   TempSrc:   Axillary   SpO2: 96% 95% 98% 97%  Weight:      Height:       Gen: NAD HEENT: NCAT, sclera white Neck: No JVD Lungs: breath sounds full, no wheezes or other adventitious sounds Cardiovascular: ST, no murmurs Abdomen: Firm, diffusely tender, diminished BS Ext: without clubbing, cyanosis, edema Neuro: grossly intact Skin: Limited exam, no lesions noted  BMP Latest Ref Rng & Units 05/25/2017 05/24/2017 05/23/2017  Glucose 65 - 99 mg/dL 409(W103(H) 119(J129(H) -  BUN 6 - 20 mg/dL 16 20 -  Creatinine 4.780.61 - 1.24 mg/dL 2.95(A0.57(L) 2.130.82 -  BUN/Creat Ratio 9 - 20 - - -  Sodium 135 - 145 mmol/L 140 137 -  Potassium 3.5 - 5.1 mmol/L 3.7 4.1 4.4  Chloride 101 - 111 mmol/L 106 104 -  CO2 22 - 32 mmol/L 28 26 -  Calcium 8.9 - 10.3 mg/dL 8.2(L) 8.3(L) -    CBC Latest Ref Rng & Units 05/25/2017 05/24/2017 05/23/2017  WBC 3.8 - 10.6 K/uL 5.5 6.6 12.1(H)  Hemoglobin 13.0 - 18.0 g/dL 11.8(L) 14.0 16.6  Hematocrit 40.0 - 52.0 % 34.9(L) 41.6 49.7  Platelets 150 - 440 K/uL 140(L) 152 205     CXR: No new film  IMPRESSION: 1.Peripheral arterial disease 2. Status post aorto iliac bypass graft-postop day 2 3. Postop pain- on Dilaudid PCA 4. Hyperkalemia- resolved 5. Mild AKI- resolved 6. Sinus Tachycardia most likely from hypovolemia  PLAN/REC: Post op mgmt per Dr Gilda CreaseSchnier  Cont PCA hydromorphone; needs cautious as patient not on bowel regimen and at risk for ileus, continue IVF Avoid ACEIs, ARBs and KCl supplementation Early mobilization  Jackson LatinoKarol Cassandria Drew, MD PCCM service Pager [phone] 05/25/2017 9:43 AM

## 2017-05-25 NOTE — Progress Notes (Signed)
Residual check performed per MD. Residual was <10650ml. NGT still in place, clamped.

## 2017-05-25 NOTE — Progress Notes (Signed)
Patient resting in bed. Alert and oriented. Rates pain 4/10 and states it is manageable. No new drainage noted to dressing on abdomen. Patient remains on dilaudid PCA pump. Tolerating PO ice chips with a small amount of water as noted per MD. No complaints of nausea. Continues to have green liquid from NG hooked up to LIS. Foley removed earlier in the shift and has since then voided successfully. Safety maintained. Will continue to monitor.

## 2017-05-25 NOTE — Progress Notes (Signed)
3 Days Post-Op   Subjective/Chief Complaint:  Doing OK. Minimal abdominal pain. Denies nausea. +flatus. No BM. Denies leg pain  Objective: Vital signs in last 24 hours: Temp:  [97.9 F (36.6 C)-98.7 F (37.1 C)] 98.5 F (36.9 C) (03/23 0800) Pulse Rate:  [108-126] 114 (03/23 0800) Resp:  [8-24] 19 (03/23 0820) BP: (103-122)/(54-82) 120/71 (03/23 0800) SpO2:  [93 %-98 %] 97 % (03/23 0820) Last BM Date: 05/21/17  Intake/Output from previous day: 03/22 0701 - 03/23 0700 In: 1858.9 [I.V.:1858.9] Out: 3045 [Urine:1145; Emesis/NG output:1900] Intake/Output this shift: Total I/O In: 430 [I.V.:400; NG/GT:30] Out: -   General appearance: alert and no distress Cardio: regular rate and rhythm, S1, S2 normal, no murmur, click, rub or gallop GI: soft, non-tender; bowel sounds normal; no masses,  no organomegaly Extremities: +DP/PT  Incision- C/D/I  Lab Results:  Recent Labs    05/24/17 0532 05/25/17 0414  WBC 6.6 5.5  HGB 14.0 11.8*  HCT 41.6 34.9*  PLT 152 140*   BMET Recent Labs    05/24/17 0532 05/25/17 0414  NA 137 140  K 4.1 3.7  CL 104 106  CO2 26 28  GLUCOSE 129* 103*  BUN 20 16  CREATININE 0.82 0.57*  CALCIUM 8.3* 8.2*   PT/INR No results for input(s): LABPROT, INR in the last 72 hours. ABG No results for input(s): PHART, HCO3 in the last 72 hours.  Invalid input(s): PCO2, PO2  Studies/Results: No results found.  Anti-infectives: Anti-infectives (From admission, onward)   Start     Dose/Rate Route Frequency Ordered Stop   05/22/17 1800  ceFAZolin (ANCEF) IVPB 2g/100 mL premix  Status:  Discontinued     2 g 200 mL/hr over 30 Minutes Intravenous Every 8 hours 05/22/17 1753 05/23/17 0912   05/22/17 0852  ceFAZolin (ANCEF) 2-4 GM/100ML-% IVPB    Note to Pharmacy:  Agnes Lawrenceobinson, Patricia  : cabinet override      05/22/17 0852 05/22/17 1135   05/22/17 0600  ceFAZolin (ANCEF) IVPB 2g/100 mL premix     2 g 200 mL/hr over 30 Minutes Intravenous On call  to O.R. 05/21/17 2139 05/22/17 1652      Assessment/Plan: s/p Procedure(s): AORTA BIFEMORAL BYPASS GRAFT, Aortic bilateral, and internal ischemia (N/A) INSERTION CENTRAL LINE ADULT Left Internal Jugular (Left)  Aortobiiliac bypass graftwith a 14 x 7 bifurcated Dacron graft,Aortic endarterectomy,Right common and internal iliac artery endarterectomy,Left common and internal iliac artery endarterectomy,Reimplantation of the IMA into aortic bypass graft   NGT clamping trial.  OOB to chair Limit ICE chips during clamping trial.  LOS: 3 days    Eli Hosesco, Cyan Moultrie A 05/25/2017

## 2017-05-26 LAB — BASIC METABOLIC PANEL
ANION GAP: 10 (ref 5–15)
BUN: 14 mg/dL (ref 6–20)
CHLORIDE: 104 mmol/L (ref 101–111)
CO2: 23 mmol/L (ref 22–32)
Calcium: 7.9 mg/dL — ABNORMAL LOW (ref 8.9–10.3)
Creatinine, Ser: 0.6 mg/dL — ABNORMAL LOW (ref 0.61–1.24)
GFR calc Af Amer: >60 mL/min (ref >60)
GLUCOSE: 98 mg/dL (ref 65–99)
POTASSIUM: 3.2 mmol/L — AB (ref 3.5–5.1)
Sodium: 137 mmol/L (ref 135–145)

## 2017-05-26 LAB — POTASSIUM: Potassium: 3.6 mmol/L (ref 3.5–5.1)

## 2017-05-26 MED ORDER — POTASSIUM CHLORIDE 20 MEQ/15ML (10%) PO SOLN
40.0000 meq | ORAL | Status: AC
  Administered 2017-05-26 (×2): 40 meq via ORAL
  Filled 2017-05-26 (×2): qty 30

## 2017-05-26 MED ORDER — POTASSIUM CHLORIDE CRYS ER 20 MEQ PO TBCR
40.0000 meq | EXTENDED_RELEASE_TABLET | Freq: Once | ORAL | Status: AC
  Administered 2017-05-26: 40 meq via ORAL
  Filled 2017-05-26: qty 2

## 2017-05-26 NOTE — Progress Notes (Addendum)
MEDICATION RELATED CONSULT NOTE  Pharmacy Consult for electrolyte management Indication: hypokalemia  No Known Allergies  Patient Measurements: Height: 5\' 5"  (165.1 cm) Weight: 162 lb 11.2 oz (73.8 kg) IBW/kg (Calculated) : 61.5  Vital Signs: Temp: 98.1 F (36.7 C) (03/24 0505) Temp Source: Oral (03/24 0505) BP: 130/76 (03/24 0505) Pulse Rate: 105 (03/24 0505) Intake/Output from previous day: 03/23 0701 - 03/24 0700 In: 1185.3 [I.V.:911.3; NG/GT:30; IV Piggyback:244] Out: 1350 [Urine:1350] Intake/Output from this shift: No intake/output data recorded.  Labs: Recent Labs    05/24/17 0532 05/25/17 0414 05/26/17 0730  WBC 6.6 5.5  --   HGB 14.0 11.8*  --   HCT 41.6 34.9*  --   PLT 152 140*  --   CREATININE 0.82 0.57* 0.60*  MG  --  1.9  --    Estimated Creatinine Clearance: 101.4 mL/min (A) (by C-G formula based on SCr of 0.6 mg/dL (L)).   Microbiology: Recent Results (from the past 720 hour(s))  Surgical pcr screen     Status: None   Collection Time: 05/21/17  2:49 PM  Result Value Ref Range Status   MRSA, PCR NEGATIVE NEGATIVE Final   Staphylococcus aureus NEGATIVE NEGATIVE Final    Comment: (NOTE) The Xpert SA Assay (FDA approved for NASAL specimens in patients 53 years of age and older), is one component of a comprehensive surveillance program. It is not intended to diagnose infection nor to guide or monitor treatment. Performed at [facility], [address] [place], [place] [address]     Medical History: Past Medical History:  Diagnosis Date  . Leg pain   . Low back pain 04/04/2017  . Patient denies medical problems     Medications:  Infusions:  . sodium chloride 100 mL/hr at 05/25/17 2112  . sodium chloride    . magnesium sulfate 1 - 4 g bolus IVPB      Assessment: 51 yom with PVD now s/p aortoiliac bypass graft. Patient was hyperkalemic on admission. K has now normalized and creatinine clearance is improving. Pharmacy consulted  to manage electrolytes - specifically to keep potassium above 4.   Goal of Therapy:  K 4 to 5  Plan:  3/24 at 07:30, K = 3.2.  Give potassium chloride 40 mEq PO Q4H x 2 doses this morning.  Recheck K at 18:00.   3/24 1748 K 3.6. Will give additional Klor-Con 40 mEq po x 1 and recheck electrolytes tomorrow with AM labs. -NAC  Recheck electrolytes tomorrow with AM labs.  Stormy CardKatsoudas,Christine K, [facility] LPRPH Clinical Pharmacist 05/26/2017,8:20 AM

## 2017-05-26 NOTE — Progress Notes (Signed)
Notified Dr. Evie LacksEsco of patient refusing his dilaudid PCA. New order put in at this time.

## 2017-05-26 NOTE — Progress Notes (Signed)
4 Days Post-Op   Subjective/Chief Complaint: Doing OK. Issues with pain control overnight- now controlled. Denies nausea. +flatus. Tolerated clamping trial.   Objective: Vital signs in last 24 hours: Temp:  [98.1 F (36.7 C)-99 F (37.2 C)] 98.1 F (36.7 C) (03/24 0505) Pulse Rate:  [102-114] 105 (03/24 0505) Resp:  [12-20] 20 (03/24 0505) BP: (111-130)/(73-76) 130/76 (03/24 0505) SpO2:  [93 %-99 %] 99 % (03/24 0505) Last BM Date: 05/21/17  Intake/Output from previous day: 03/23 0701 - 03/24 0700 In: 1185.3 [I.V.:911.3; NG/GT:30; IV Piggyback:244] Out: 1350 [Urine:1350] Intake/Output this shift: Total I/O In: 1642 [I.V.:1642] Out: 0   General appearance: alert and no distress Cardio: regular rate and rhythm, S1, S2 normal, no murmur, click, rub or gallop GI: soft, non-tender; bowel sounds normal; no masses,  no organomegaly Extremities: warm, +DP/PT bilaterally  Lab Results:  Recent Labs    05/24/17 0532 05/25/17 0414  WBC 6.6 5.5  HGB 14.0 11.8*  HCT 41.6 34.9*  PLT 152 140*   BMET Recent Labs    05/25/17 0414 05/26/17 0730  NA 140 137  K 3.7 3.2*  CL 106 104  CO2 28 23  GLUCOSE 103* 98  BUN 16 14  CREATININE 0.57* 0.60*  CALCIUM 8.2* 7.9*   PT/INR No results for input(s): LABPROT, INR in the last 72 hours. ABG No results for input(s): PHART, HCO3 in the last 72 hours.  Invalid input(s): PCO2, PO2  Studies/Results: No results found.  Anti-infectives: Anti-infectives (From admission, onward)   Start     Dose/Rate Route Frequency Ordered Stop   05/22/17 1800  ceFAZolin (ANCEF) IVPB 2g/100 mL premix  Status:  Discontinued     2 g 200 mL/hr over 30 Minutes Intravenous Every 8 hours 05/22/17 1753 05/23/17 0912   05/22/17 0852  ceFAZolin (ANCEF) 2-4 GM/100ML-% IVPB    Note to Pharmacy:  [name], [name]  : cabinet override      05/22/17 0852 05/22/17 1135   05/22/17 0600  ceFAZolin (ANCEF) IVPB 2g/100 mL premix     2 g 200 mL/hr over 30  Minutes Intravenous On call to O.R. 05/21/17 2139 05/22/17 1652      Assessment/Plan: s/p Procedure(s): AORTA BIFEMORAL BYPASS GRAFT, Aortic bilateral, and internal ischemia (N/[name]) INSERTION CENTRAL LINE ADULT Left Internal Jugular (Left) NGT removed.  Will begin Clear diet today. Pain control Continue OOB-ambulate as tolerated.  LOS: 4 days    [name], [name] 05/26/2017

## 2017-05-27 LAB — CBC WITH DIFFERENTIAL/PLATELET
Basophils Absolute: 0 10*3/uL (ref 0–0.1)
Basophils Relative: 0 %
EOS ABS: 0.1 10*3/uL (ref 0–0.7)
EOS PCT: 2 %
HCT: 29.3 % — ABNORMAL LOW (ref 40.0–52.0)
Hemoglobin: 10.4 g/dL — ABNORMAL LOW (ref 13.0–18.0)
LYMPHS ABS: 1.1 10*3/uL (ref 1.0–3.6)
LYMPHS PCT: 32 %
MCH: 30.3 pg (ref 26.0–34.0)
MCHC: 35.5 g/dL (ref 32.0–36.0)
MCV: 85.4 fL (ref 80.0–100.0)
MONOS PCT: 10 %
Monocytes Absolute: 0.4 10*3/uL (ref 0.2–1.0)
Neutro Abs: 1.9 10*3/uL (ref 1.4–6.5)
Neutrophils Relative %: 56 %
PLATELETS: 154 10*3/uL (ref 150–440)
RBC: 3.43 MIL/uL — ABNORMAL LOW (ref 4.40–5.90)
RDW: 14.3 % (ref 11.5–14.5)
WBC: 3.5 10*3/uL — ABNORMAL LOW (ref 3.8–10.6)

## 2017-05-27 LAB — BASIC METABOLIC PANEL
Anion gap: 7 (ref 5–15)
BUN: 14 mg/dL (ref 6–20)
CALCIUM: 8 mg/dL — AB (ref 8.9–10.3)
CO2: 23 mmol/L (ref 22–32)
CREATININE: 0.7 mg/dL (ref 0.61–1.24)
Chloride: 110 mmol/L (ref 101–111)
GFR calc Af Amer: >60 mL/min (ref >60)
GFR calc non Af Amer: >60 mL/min (ref >60)
GLUCOSE: 100 mg/dL — AB (ref 65–99)
Potassium: 3.5 mmol/L (ref 3.5–5.1)
Sodium: 140 mmol/L (ref 135–145)

## 2017-05-27 LAB — PHOSPHORUS: PHOSPHORUS: 1.5 mg/dL — AB (ref 2.5–4.6)

## 2017-05-27 LAB — MAGNESIUM: Magnesium: 1.9 mg/dL (ref 1.7–2.4)

## 2017-05-27 MED ORDER — OXYCODONE-ACETAMINOPHEN 7.5-325 MG PO TABS
1.0000 | ORAL_TABLET | ORAL | Status: DC | PRN
  Administered 2017-05-27 (×2): 1 via ORAL
  Administered 2017-05-28 – 2017-05-29 (×7): 2 via ORAL
  Filled 2017-05-27: qty 2
  Filled 2017-05-27: qty 1
  Filled 2017-05-27 (×4): qty 2
  Filled 2017-05-27: qty 1
  Filled 2017-05-27 (×2): qty 2

## 2017-05-27 MED ORDER — K PHOS MONO-SOD PHOS DI & MONO 155-852-130 MG PO TABS
500.0000 mg | ORAL_TABLET | ORAL | Status: AC
  Administered 2017-05-27 (×3): 500 mg via ORAL
  Filled 2017-05-27 (×3): qty 2

## 2017-05-27 MED ORDER — POTASSIUM CHLORIDE 20 MEQ PO PACK
40.0000 meq | PACK | Freq: Once | ORAL | Status: AC
  Administered 2017-05-27: 40 meq via ORAL
  Filled 2017-05-27: qty 2

## 2017-05-27 NOTE — Progress Notes (Signed)
[facility]  Daily Progress Note   Subjective  - 5 Days Post-Op  Tolerating liquids, still having pain  Objective Vitals:   05/27/17 0950 05/27/17 1000 05/27/17 1008 05/27/17 1151  BP:    129/82  Pulse: 95 (!) 104 98 86  Resp:    18  Temp:    98.5 F (36.9 C)  TempSrc:    Oral  SpO2: 92% 91% 94% 100%  Weight:      Height:        Intake/Output Summary (Last 24 hours) at 05/27/2017 1422 Last data filed at 05/27/2017 1402 Gross per 24 hour  Intake 4039.33 ml  Output 1550 ml  Net 2489.33 ml    PULM  Normal effort , no use of accessory muscles CV  No JVD, RRR Abd      No distended,  Appropriate tender + bowel sounds VASC  Abdominal incision CD&I 2+ pedal pulses  Laboratory CBC    Component Value Date/Time   WBC 3.5 (L) 05/27/2017 0454   HGB 10.4 (L) 05/27/2017 0454   HCT 29.3 (L) 05/27/2017 0454   PLT 154 05/27/2017 0454    BMET    Component Value Date/Time   NA 140 05/27/2017 0454   NA 141 04/11/2017 0912   K 3.5 05/27/2017 0454   CL 110 05/27/2017 0454   CO2 23 05/27/2017 0454   GLUCOSE 100 (H) 05/27/2017 0454   BUN 14 05/27/2017 0454   BUN 11 04/11/2017 0912   CREATININE 0.70 05/27/2017 0454   CALCIUM 8.0 (L) 05/27/2017 0454   GFRNONAA >60 05/27/2017 0454   GFRAA >60 05/27/2017 0454    Assessment/Planning: POD #5 s/p Aorta bi iliac  Advance diet  Oral pain medications    [name]  05/27/2017, 2:22 PM

## 2017-05-27 NOTE — Progress Notes (Signed)
MEDICATION RELATED CONSULT NOTE  Pharmacy Consult for electrolyte management Indication: hypokalemia  No Known Allergies  Patient Measurements: Height: 5\' 5"  (165.1 cm) Weight: 162 lb 11.2 oz (73.8 kg) IBW/kg (Calculated) : 61.5  Vital Signs: Temp: 98 F (36.7 C) (03/25 0535) Temp Source: Oral (03/25 0535) BP: 113/76 (03/25 0535) Pulse Rate: 83 (03/25 0535) Intake/Output from previous day: 03/24 0701 - 03/25 0700 In: 4795.7 [P.O.:1200; I.V.:3595.7] Out: 0  Intake/Output from this shift: Total I/O In: 255 [I.V.:255] Out: -   Labs: Recent Labs    05/25/17 0414 05/26/17 0730 05/27/17 0454  WBC 5.5  --  3.5*  HGB 11.8*  --  10.4*  HCT 34.9*  --  29.3*  PLT 140*  --  154  CREATININE 0.57* 0.60* 0.70  MG 1.9  --  1.9  PHOS  --   --  1.5*   Potassium (mmol/L)  Date Value  05/27/2017 3.5   Magnesium (mg/dL)  Date Value  16/10/960403/25/2019 1.9  ] Phosphorus (mg/dL)  Date Value  54/09/811903/25/2019 1.5 (L)  ] Estimated Creatinine Clearance: 101.4 mL/min (by C-G formula based on SCr of 0.7 mg/dL).   Medications:  Infusions:  . sodium chloride 100 mL/hr at 05/27/17 0703  . sodium chloride    . magnesium sulfate 1 - 4 g bolus IVPB      Assessment: 51 yom with PVD now s/p aortoiliac bypass graft. Patient was hyperkalemic on admission. K has now normalized and creatinine clearance is improving. Pharmacy consulted to manage electrolytes - specifically to keep potassium above 4.   Goal of Therapy:  K 4 to 5 Mg: 1.8-2.4 Phos: 2.5 - 4.6   Plan:  3/25 K+: 3.5, Mg: 1.9, Phos: 1.5 - Will order Kphos Neutral tabs 500mg  every 4 hours x 3 doses plus KCL 40mEq x1 dose.   Recheck electrolytes tomorrow with AM labs.  Gardner CandleSheema M Brynnley Dayrit, PharmD, BCPS Clinical Pharmacist 05/27/2017 8:02 AM

## 2017-05-27 NOTE — Progress Notes (Signed)
Physical Therapy Treatment Patient Details Name: [name] MRN: [id number] DOB: [birth year] Today's Date: 05/27/2017    History of Present Illness Pt is a 52 y.o. 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 Pt had surgery 05/22/17 Aortic endarterectomy, Aortobiiliac bypass, Right common and internal iliac artery endarterectomy, Left common and internal iliac artery endarterectomy, Reimplantation of the IMA into the bypass graft.  Pt seen and evaulated by PT 05/24/17, POD#2. PMH inludes leg pain, LBP, knee surgeries (2006), colonoscopy 2019, kidney stone surgery.    PT Comments    Pt progressed ambulation and transfers with decreased assist required (200 ft CGA). Pt speaks Farsi, interpreter was offered, Pt denied the need for an interpreter. Pt reported 10/10 abdominal incision pain, at beginning of session. Pt stated that he felt well enough to ambulate around the nurses station; Pt tolerance was monitored throughout session. At end of session, Pt reported 10/10 abdominal incision pain and requested pain meds; RN notified. Pt would benefit from skilled PT to progress strength and activity tolerance.   Follow Up Recommendations  Home health PT     Equipment Recommendations       Recommendations for Other Services       Precautions / Restrictions Precautions Precautions: Fall;Other (comment) Precaution Comments: no lifting >10lbs; long abdominal incision Restrictions Weight Bearing Restrictions: No    Mobility  Bed Mobility                  Transfers Overall transfer level: Needs assistance   Transfers: Sit to/from Stand Sit to Stand: Min guard         General transfer comment: Pt performed sit to stand while holding pillow over abdominal incision, CGA  provided but not required.  Ambulation/Gait Ambulation/Gait assistance: Min guard Ambulation Distance (Feet): 200 Feet   Gait Pattern/deviations: WFL(Within Functional Limits) Gait velocity: decreased   General Gait Details: Pt ambulated w/ CGA around nurses station, holding pillow over abdominal incision. Pt reported he is only moving slowly due to abdominal incision pain.   Stairs            Wheelchair Mobility    Modified Rankin (Stroke Patients Only)       Balance Overall balance assessment: Modified Independent Sitting-balance support: Feet supported Sitting balance-Leahy Scale: Good Sitting balance - Comments: Pt demonstrated good stability while sitting EOB and managing IV line reaching within BOS.     Standing balance-Leahy Scale: Good Standing balance comment: Pt demonstrated good standing balance at EOB prior to ambulation.                            Cognition Arousal/Alertness: Awake/alert Behavior During Therapy: WFL for tasks assessed/performed Overall Cognitive Status: Within Functional Limits for tasks assessed                                 General Comments: [name]      Exercises      General Comments  Pt agreeable to session.      Pertinent Vitals/Pain Pain Assessment: 0-10 Pain Score: 10-Worst pain ever Pain Location: Abdominal incision Pain Descriptors / Indicators: Aching;Operative site guarding;Grimacing Pain Intervention(s): Limited activity within patient's tolerance;Monitored during session;Repositioned;Patient requesting pain meds-RN notified    Home  Living                      Prior Function            PT Goals (current goals can now be found in the care plan section) Acute Rehab PT Goals Patient Stated Goal: To go home PT Goal Formulation: With patient Time For Goal Achievement: 06/07/17 Potential to Achieve Goals: Good Progress towards PT goals: Progressing toward goals     Frequency    Min 2X/week      PT Plan Current plan remains appropriate    Co-evaluation              AM-PAC PT "6 Clicks" Daily Activity  Outcome Measure  Difficulty turning over in bed (including adjusting bedclothes, sheets and blankets)?: A Little Difficulty moving from lying on back to sitting on the side of the bed? : A Little Difficulty sitting down on and standing up from a chair with arms (e.g., wheelchair, bedside commode, etc,.)?: A Little Help needed moving to and from a bed to chair (including a wheelchair)?: A Little Help needed walking in hospital room?: A Little Help needed climbing 3-5 steps with a railing? : A Lot 6 Click Score: 17    End of Session Equipment Utilized During Treatment: Gait belt Activity Tolerance: Patient limited by pain(RN notified.) Patient left: in chair;with call bell/phone within reach;with chair alarm set;with family/visitor present(B heels elevated by pillow.) Nurse Communication: Mobility status;Patient requests pain meds PT Visit Diagnosis: Other abnormalities of gait and mobility (R26.89);Pain;Muscle weakness (generalized) (M62.81);Difficulty in walking, not elsewhere classified (R26.2) Pain - Right/Left: (midline abdominal incision) Pain - part of body: (midline abdominal incision)     Time: 1610-9604 PT Time Calculation (min) (ACUTE ONLY): 23 min  Charges:                       G Codes:        Kailo Kosik Mondrian-Pardue, SPT 05/27/2017, 10:33 AM

## 2017-05-28 LAB — POTASSIUM: POTASSIUM: 3.3 mmol/L — AB (ref 3.5–5.1)

## 2017-05-28 LAB — PHOSPHORUS: PHOSPHORUS: 3.9 mg/dL (ref 2.5–4.6)

## 2017-05-28 MED ORDER — POTASSIUM CHLORIDE 20 MEQ PO PACK
40.0000 meq | PACK | ORAL | Status: AC
  Administered 2017-05-28 (×2): 40 meq via ORAL
  Filled 2017-05-28 (×2): qty 2

## 2017-05-28 MED ORDER — POLYETHYLENE GLYCOL 3350 17 G PO PACK
34.0000 g | PACK | Freq: Once | ORAL | Status: AC
  Administered 2017-05-28: 34 g via ORAL
  Filled 2017-05-28: qty 2

## 2017-05-28 MED ORDER — MENTHOL 3 MG MT LOZG: 1.0000 | LOZENGE | OROMUCOSAL | Status: DC | PRN

## 2017-05-28 MED ORDER — MAGNESIUM HYDROXIDE 400 MG/5ML PO SUSP
30.0000 mL | Freq: Every day | ORAL | Status: DC
  Administered 2017-05-28 – 2017-05-29 (×2): 30 mL via ORAL
  Filled 2017-05-28 (×3): qty 30

## 2017-05-28 MED ORDER — HYDROMORPHONE HCL 1 MG/ML IJ SOLN: 0.5000 mg | INTRAMUSCULAR | Status: DC | PRN

## 2017-05-28 MED ORDER — BISACODYL 10 MG RE SUPP
10.0000 mg | Freq: Every day | RECTAL | Status: DC | PRN
  Administered 2017-05-29: 10 mg via RECTAL
  Filled 2017-05-28: qty 1

## 2017-05-28 MED ORDER — POTASSIUM CHLORIDE 20 MEQ PO PACK
40.0000 meq | PACK | Freq: Once | ORAL | Status: AC
  Administered 2017-05-28: 40 meq via ORAL
  Filled 2017-05-28: qty 2

## 2017-05-28 NOTE — Progress Notes (Signed)
Occupational Therapy Treatment Patient Details Name: [name] MRN: [id number] DOB: May 01, 1965 Today's Date: 05/28/2017    History of present illness Pt is a 52 y.o. 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 Pt had surgery 05/22/17 Aortic endarterectomy, Aortobiiliac bypass, Right common and internal iliac artery endarterectomy, Left common and internal iliac artery endarterectomy, Reimplantation of the IMA into the bypass graft.  Pt seen and evaulated by PT 05/24/17, POD#2. PMH inludes leg pain, LBP, knee surgeries (2006), colonoscopy 2019, kidney stone surgery.   OT comments  Pt seen for education and training in reacher and long handled shoe horn but demonstrated the ability to lean foward in bed and don/doff socks and reach both feet and does not feel he will need any AD for dressing.  Discussed rec for using a BSC over the toilet and he did not feel he would need this either despite his grimacing and reports of pain in abdomen.  All education and training with rec completed and all goals met.  Pt to be discharged from OT services this date.   Follow Up Recommendations  Supervision/Assistance - 24 hour;No OT follow up    Equipment Recommendations  3 in 1 bedside commode    Recommendations for Other Services      Precautions / Restrictions Precautions Precautions: Fall;Other (comment) Precaution Comments: no lifting >10lbs; long abdominal incision Restrictions Weight Bearing Restrictions: No       Mobility Bed Mobility                  Transfers                      Balance                                           ADL either performed or assessed with clinical judgement   ADL                                          General ADL Comments: Pt seen for education and training in reacher and long handled shoe horn but demonstrated the ability to lean foward in bed and don/doff socks and reach both feet and does not feel he will need any AD for dressing.  Discussed rec for using a BSC over the toilet and he did not feel he would need this either despite his grimacing and reports of pain in abdomen.  All education and training with rec completed and all goals met.  Pt to be discharged from OT services this date.     Vision       Perception     Praxis      Cognition Arousal/Alertness: Awake/alert Behavior During Therapy: WFL for tasks assessed/performed Overall Cognitive Status: Within Functional Limits for tasks assessed                                          Exercises     Shoulder Instructions       General Comments  Pertinent Vitals/ Pain       Pain Assessment: 0-10 Pain Score: 8  Pain Location: Abdominal incision Pain Descriptors / Indicators: Aching;Operative site guarding;Grimacing Pain Intervention(s): Limited activity within patient's tolerance;Monitored during session;Repositioned;Premedicated before session  Home Living                                          Prior Functioning/Environment              Frequency           Progress Toward Goals  OT Goals(current goals can now be found in the care plan section)  Progress towards OT goals: Goals met/education completed, patient discharged from [facility] Discharge plan remains appropriate    Co-evaluation                 AM-PAC PT "6 Clicks" Daily Activity     Outcome Measure   Help from another person eating meals?: None Help from another person taking care of personal grooming?: None Help from another person toileting, which includes using toliet, bedpan, or urinal?: A Little Help from another person bathing (including washing, rinsing, drying)?: A  Little Help from another person to put on and taking off regular upper body clothing?: None Help from another person to put on and taking off regular lower body clothing?: None 6 Click Score: 22    End of Session    OT Visit Diagnosis: Other abnormalities of gait and mobility (R26.89);Pain   Activity Tolerance Patient tolerated treatment well   Patient Left in bed;with call bell/phone within reach;with bed alarm set   Nurse Communication          Time: 3007-6226 OT Time Calculation (min): 30 min  Charges: OT General Charges $OT Visit: 1 Visit OT Treatments $Self Care/Home Management : 23-37 mins  Chrys Racer, OTR/L ascom [phone] 05/28/17, 5:05 PM

## 2017-05-28 NOTE — Care Management (Signed)
Patient status post PROCEDURE: 1. Aortobiiliac bypass graft with a 14 x 7 bifurcated Dacron graft 2.   Aortic endarterectomy 3.   Right common and internal iliac artery endarterectomy 4.   Left common and internal iliac artery endarterectomy 5.   Reimplantation of the IMA into aortic bypass graft   Patient lives at home with spouse.  PCP Jadali.  Independent at baseline.  PT has assessed patient and recommends home health PT.  Patient ambulated 200 feet with PT.  Per nursing staff patient is independent in the room. Patient states at this time he does not have any concerns about his mobility "its all because of the pain".  [facility]  RNCM will following incase he changes his mind prior to discharge.

## 2017-05-28 NOTE — Progress Notes (Addendum)
MEDICATION RELATED CONSULT NOTE  Pharmacy Consult for electrolyte management Indication: hypokalemia  No Known Allergies  Patient Measurements: Height: 5\' 5"  (165.1 cm) Weight: 162 lb 11.2 oz (73.8 kg) IBW/kg (Calculated) : 61.5  Vital Signs: Temp: 98.3 F (36.8 C) (03/26 0412) Temp Source: Oral (03/26 0412) BP: 121/80 (03/26 0412) Pulse Rate: 90 (03/26 0412) Intake/Output from previous day: 03/25 0701 - 03/26 0700 In: 2895.4 [P.O.:720; I.V.:2175.4] Out: 1550 [Urine:1550] Intake/Output from this shift: Total I/O In: 157.5 [I.V.:157.5] Out: 300 [Urine:300]  Labs: Recent Labs    05/26/17 0730 05/27/17 0454 05/28/17 0448  WBC  --  3.5*  --   HGB  --  10.4*  --   HCT  --  29.3*  --   PLT  --  154  --   CREATININE 0.60* 0.70  --   MG  --  1.9  --   PHOS  --  1.5* 3.9   Potassium (mmol/L)  Date Value  05/28/2017 3.3 (L)   Magnesium (mg/dL)  Date Value  16/10/960403/25/2019 1.9  ] Phosphorus (mg/dL)  Date Value  54/09/811903/26/2019 3.9  ] Estimated Creatinine Clearance: 101.4 mL/min (by C-G formula based on SCr of 0.7 mg/dL).   Medications:  Infusions:  . sodium chloride 75 mL/hr at 05/28/17 0726  . sodium chloride    . magnesium sulfate 1 - 4 g bolus IVPB      Assessment: 51 yom with PVD now s/p aortoiliac bypass graft. Patient was hyperkalemic on admission. K has now normalized and creatinine clearance is improving. Pharmacy consulted to manage electrolytes - specifically to keep potassium above 4.   Goal of Therapy:  K 4 to 5 Mg: 1.8-2.4 Phos: 2.5 - 4.6   Plan:  3/25 K+: 3.3, Mg: 1.9, Phos: 3.9 - Will order KCL 40mEq every 4 hours x2 doses. Will recheck magnesium and K+ with AM labs and continue to replace as needed.     Gardner CandleSheema M Breeanne Oblinger, PharmD, BCPS Clinical Pharmacist 05/28/2017 8:51 AM

## 2017-05-28 NOTE — Plan of Care (Signed)
Pt with frequent c/o pain and is consistently rated at an 8 out of 10 regardless. Ambulating in room independently. Tolerating diet. Large midline incision is clean, dry and intact.

## 2017-05-28 NOTE — Progress Notes (Signed)
[facility]  Daily Progress Note   Subjective  - 6 Days Post-Op  Patient complaining of abdominal bloating and distention he is still passing gas but he is not been able to have a bowel movement.  He states he is drinking liquids without problem but when he tries to eat he experiences increased pain  Objective Vitals:   05/27/17 1646 05/27/17 2159 05/28/17 0412 05/28/17 1231  BP: 122/76 134/82 121/80 126/90  Pulse: 92 91 90 87  Resp: 18 20 16    Temp: 98.2 F (36.8 C) 98.9 F (37.2 C) 98.3 F (36.8 C) 98.4 F (36.9 C)  TempSrc: Oral Oral Oral Oral  SpO2: 98% 98% 98% 98%  Weight:      Height:        Intake/Output Summary (Last 24 hours) at 05/28/2017 1922 Last data filed at 05/28/2017 1857 Gross per 24 hour  Intake 1845 ml  Output 500 ml  Net 1345 ml    PULM  Normal effort , no use of accessory muscles CV  No JVD, RRR Abd      mild distended, appropriate tender VASC  2+ dorsalis pedis pulses  Laboratory CBC    Component Value Date/Time   WBC 3.5 (L) 05/27/2017 0454   HGB 10.4 (L) 05/27/2017 0454   HCT 29.3 (L) 05/27/2017 0454   PLT 154 05/27/2017 0454    BMET    Component Value Date/Time   NA 140 05/27/2017 0454   NA 141 04/11/2017 0912   K 3.3 (L) 05/28/2017 0448   CL 110 05/27/2017 0454   CO2 23 05/27/2017 0454   GLUCOSE 100 (H) 05/27/2017 0454   BUN 14 05/27/2017 0454   BUN 11 04/11/2017 0912   CREATININE 0.70 05/27/2017 0454   CALCIUM 8.0 (L) 05/27/2017 0454   GFRNONAA >60 05/27/2017 0454   GFRAA >60 05/27/2017 0454    Assessment/Planning: POD #6 s/p aortobiiliac bypass he is doing very well with essentially a normal postoperative course   I will DC the central line saline lock his fluids.  I will order a more aggressive laxative plan.  I have decreased his narcotic pain medications and will continue to wean the Dilaudid.  Pending the patient having a bowel movement he may be able to go home tomorrow afternoon or perhaps the  next day  Levora DredgeGregory Mireille Lacombe  05/28/2017, 7:22 PM

## 2017-05-29 LAB — CBC
HCT: 35.4 % — ABNORMAL LOW (ref 40.0–52.0)
Hemoglobin: 12.2 g/dL — ABNORMAL LOW (ref 13.0–18.0)
MCH: 29.7 pg (ref 26.0–34.0)
MCHC: 34.5 g/dL (ref 32.0–36.0)
MCV: 86 fL (ref 80.0–100.0)
Platelets: 297 10*3/uL (ref 150–440)
RBC: 4.12 MIL/uL — ABNORMAL LOW (ref 4.40–5.90)
RDW: 14.3 % (ref 11.5–14.5)
WBC: 5.2 10*3/uL (ref 3.8–10.6)

## 2017-05-29 LAB — POTASSIUM: POTASSIUM: 3.7 mmol/L (ref 3.5–5.1)

## 2017-05-29 MED ORDER — DOCUSATE SODIUM 100 MG PO CAPS
100.0000 mg | ORAL_CAPSULE | Freq: Two times a day (BID) | ORAL | Status: DC
  Administered 2017-05-29 – 2017-05-30 (×3): 100 mg via ORAL
  Filled 2017-05-29 (×3): qty 1

## 2017-05-29 MED ORDER — SORBITOL 70 % SOLN
30.0000 mL | Freq: Two times a day (BID) | Status: DC
  Administered 2017-05-29 – 2017-05-30 (×2): 30 mL via ORAL
  Filled 2017-05-29 (×6): qty 30

## 2017-05-29 MED ORDER — POTASSIUM CHLORIDE 20 MEQ PO PACK
40.0000 meq | PACK | Freq: Once | ORAL | Status: AC
  Administered 2017-05-29: 40 meq via ORAL
  Filled 2017-05-29: qty 2

## 2017-05-29 MED ORDER — SORBITOL 70 % SOLN
960.0000 mL | TOPICAL_OIL | Freq: Once | ORAL | Status: DC | PRN
  Filled 2017-05-29: qty 473

## 2017-05-29 MED ORDER — SORBITOL 70 % SOLN
960.0000 mL | TOPICAL_OIL | Freq: Once | ORAL | Status: DC
  Filled 2017-05-29: qty 473

## 2017-05-29 MED ORDER — POTASSIUM CHLORIDE 20 MEQ PO PACK
20.0000 meq | PACK | Freq: Once | ORAL | Status: AC
Start: 2017-05-29 — End: 2017-05-29
  Administered 2017-05-29: 20 meq via ORAL
  Filled 2017-05-29: qty 1

## 2017-05-29 NOTE — Care Management (Addendum)
RNCM spoke with Maralyn SagoSarah PTA.  Per Maralyn SagoSarah No Pt follow up recommended.  RNCM signing off.  Please consult if indicated.

## 2017-05-29 NOTE — Progress Notes (Signed)
Physical Therapy Treatment Patient Details Name: [name] MRN: [id number] DOB: 01/20/1966 Today's Date: 05/29/2017    History of Present Illness Pt is a 52 y.o. 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 Pt had surgery 05/22/17 Aortic endarterectomy, Aortobiiliac bypass, Right common and internal iliac artery endarterectomy, Left common and internal iliac artery endarterectomy, Reimplantation of the IMA into the bypass graft.  Pt seen and evaulated by PT 05/24/17, POD#2. PMH inludes leg pain, LBP, knee surgeries (2006), colonoscopy 2019, kidney stone surgery.    PT Comments    Pt able to transition out of bed and ambulate x 2 around nursing unit without AD.  Slow steady gait.  No LOB and pt reports feeling generally comfortable with mobility.  Stated he has been walking to/from bathroom without assist.  Progressing well towards goals and has no mobility concerns.   Follow Up Recommendations       Equipment Recommendations       Recommendations for Other Services       Precautions / Restrictions Precautions Precautions: Fall;Other (comment) Precaution Comments: no lifting >10lbs; long abdominal incision Restrictions Weight Bearing Restrictions: No    Mobility  Bed Mobility Overal bed mobility: Modified Independent       Supine to sit: Independent        Transfers Overall transfer level: Independent                  Ambulation/Gait Ambulation/Gait assistance: Independent   Assistive device: None Gait Pattern/deviations: Step-through pattern   Gait velocity interpretation: at or above normal speed for age/gender     Stairs            Wheelchair Mobility    Modified Rankin (Stroke Patients Only)       Balance Overall balance  assessment: Independent                                          Cognition Arousal/Alertness: Awake/alert Behavior During Therapy: WFL for tasks assessed/performed Overall Cognitive Status: Within Functional Limits for tasks assessed                                        Exercises      General Comments        Pertinent Vitals/Pain Pain Assessment: No/denies pain    Home Living                      Prior Function            PT Goals (current goals can now be found in the care plan section) Progress towards PT goals: Progressing toward goals    Frequency    Min 2X/week      PT Plan Current plan remains appropriate    Co-evaluation              AM-PAC PT "6 Clicks" Daily Activity  Outcome Measure  Difficulty turning over in bed (including adjusting bedclothes, sheets and blankets)?: None Difficulty moving from lying on back to sitting on the side of the bed? : None Difficulty sitting down on and standing up from a chair with arms (  e.g., wheelchair, bedside commode, etc,.)?: None Help needed moving to and from a bed to chair (including a wheelchair)?: None Help needed walking in hospital room?: None Help needed climbing 3-5 steps with a railing? : A Little 6 Click Score: 23    End of Session   Activity Tolerance: Patient tolerated treatment well Patient left: in bed;with call bell/phone within reach         Time: 1200-1208 PT Time Calculation (min) (ACUTE ONLY): 8 min  Charges:  $Gait Training: 8-22 mins                    G Codes:       Danielle DessSarah Mariaceleste Herrera, PTA 05/29/17, 12:11 PM

## 2017-05-29 NOTE — Progress Notes (Signed)
MEDICATION RELATED CONSULT NOTE  Pharmacy Consult for electrolyte management Indication: hypokalemia  No Known Allergies  Patient Measurements: Height: 5\' 5"  (165.1 cm) Weight: 162 lb 11.2 oz (73.8 kg) IBW/kg (Calculated) : 61.5  Vital Signs: Temp: 98 F (36.7 C) (03/27 0503) Temp Source: Oral (03/27 0503) BP: 128/81 (03/27 0503) Pulse Rate: 82 (03/27 0503) Intake/Output from previous day: 03/26 0701 - 03/27 0700 In: 1010 [P.O.:240; I.V.:770] Out: 500 [Urine:500] Intake/Output from this shift: No intake/output data recorded.  Labs: Recent Labs    05/27/17 0454 05/28/17 0448 05/29/17 0653  WBC 3.5*  --  5.2  HGB 10.4*  --  12.2*  HCT 29.3*  --  35.4*  PLT 154  --  297  CREATININE 0.70  --   --   MG 1.9  --   --   PHOS 1.5* 3.9  --    Potassium (mmol/L)  Date Value  05/29/2017 3.7   Magnesium (mg/dL)  Date Value  16/10/960403/25/2019 1.9  ] Phosphorus (mg/dL)  Date Value  54/09/811903/26/2019 3.9  ] Estimated Creatinine Clearance: 101.4 mL/min (by C-G formula based on SCr of 0.7 mg/dL).   Medications:  Infusions:  . magnesium sulfate 1 - 4 g bolus IVPB      Assessment: 51 yom with PVD now s/p aortoiliac bypass graft. Patient was hyperkalemic on admission. K has now normalized and creatinine clearance is improving. Pharmacy consulted to manage electrolytes - specifically to keep potassium above 4.   Goal of Therapy:  K 4 to 5 Mg: 1.8-2.4 Phos: 2.5 - 4.6   Plan:  3/25 K+: 3.7 Will order KCL 40mEq x 1 dose, followed by KCL 20mEq x 1 dose 4 hours after.    K+ with AM labs and continue to replace as needed.     Gardner CandleSheema M Leland Staszewski, PharmD, BCPS Clinical Pharmacist 05/29/2017 8:00 AM

## 2017-05-29 NOTE — Progress Notes (Signed)
[facility]  Daily Progress Note   Subjective  - 7 Days Post-Op  Doing well No BM despite oral agents and suppository   Objective Vitals:   05/28/17 2107 05/29/17 0503 05/29/17 1132 05/29/17 1236  BP: 124/87 128/81 121/85 119/86  Pulse: 76 82 87 79  Resp: 20 16 18    Temp: 98.5 F (36.9 C) 98 F (36.7 C) 98.1 F (36.7 C) 97.7 F (36.5 C)  TempSrc: Oral Oral Oral Oral  SpO2: 97% 96% 98% 98%  Weight:      Height:        Intake/Output Summary (Last 24 hours) at 05/29/2017 1537 Last data filed at 05/28/2017 1857 Gross per 24 hour  Intake 612.5 ml  Output -  Net 612.5 ml    PULM  CTAB CV  RRR VASC  Palpable pedal pulses bilatearlly  Laboratory CBC    Component Value Date/Time   WBC 5.2 05/29/2017 0653   HGB 12.2 (L) 05/29/2017 0653   HCT 35.4 (L) 05/29/2017 0653   PLT 297 05/29/2017 0653    BMET    Component Value Date/Time   NA 140 05/27/2017 0454   NA 141 04/11/2017 0912   K 3.7 05/29/2017 0653   CL 110 05/27/2017 0454   CO2 23 05/27/2017 0454   GLUCOSE 100 (H) 05/27/2017 0454   BUN 14 05/27/2017 0454   BUN 11 04/11/2017 0912   CREATININE 0.70 05/27/2017 0454   CALCIUM 8.0 (L) 05/27/2017 0454   GFRNONAA >60 05/27/2017 0454   GFRAA >60 05/27/2017 0454    Assessment/Planning: POD #7 s/p aortabiiliac bypass   Overall doing well  Still no BM  Will add colace, sorbitol, and enemas to get bowels moving  Otherwise doing well   If has BM, possible discharge tomorrow    Festus BarrenJason Dew  05/29/2017, 3:37 PM

## 2017-05-30 LAB — POTASSIUM: POTASSIUM: 3.9 mmol/L (ref 3.5–5.1)

## 2017-05-30 MED ORDER — ASPIRIN 81 MG PO TBEC: 81.0000 mg | DELAYED_RELEASE_TABLET | Freq: Every day | ORAL | Status: DC

## 2017-05-30 MED ORDER — CLOPIDOGREL BISULFATE 75 MG PO TABS: 75.0000 mg | ORAL_TABLET | Freq: Every day | ORAL | Status: DC

## 2017-05-30 MED ORDER — ASPIRIN EC 81 MG PO TBEC: 81.0000 mg | DELAYED_RELEASE_TABLET | Freq: Every day | ORAL | Status: DC

## 2017-05-30 MED ORDER — OXYCODONE HCL 5 MG PO TABS: ORAL_TABLET | ORAL | 0 refills | Status: DC

## 2017-05-30 MED ORDER — POTASSIUM CHLORIDE 20 MEQ PO PACK
40.0000 meq | PACK | Freq: Once | ORAL | Status: AC
  Administered 2017-05-30: 40 meq via ORAL
  Filled 2017-05-30: qty 2

## 2017-05-30 NOTE — Care Management (Signed)
Informed that patient had small BM this morning. Have reached out to attending to discuss discharge disposition.  Currently is performing  procedure and has not rounded but anticipate discharge today

## 2017-05-30 NOTE — Progress Notes (Signed)
MEDICATION RELATED CONSULT NOTE  Pharmacy Consult for electrolyte management Indication: hypokalemia  No Known Allergies  Patient Measurements: Height: 5\' 5"  (165.1 cm) Weight: 162 lb 11.2 oz (73.8 kg) IBW/kg (Calculated) : 61.5  Vital Signs: Temp: 98.2 F (36.8 C) (03/28 0530) Temp Source: Oral (03/28 0530) BP: 112/83 (03/28 0530) Pulse Rate: 83 (03/28 0530) Intake/Output from previous day: 03/27 0701 - 03/28 0700 In: 240 [P.O.:240] Out: 3 [Urine:3] Intake/Output from this shift: No intake/output data recorded.  Labs: Recent Labs    05/28/17 0448 05/29/17 0653  WBC  --  5.2  HGB  --  12.2*  HCT  --  35.4*  PLT  --  297  PHOS 3.9  --    Potassium (mmol/L)  Date Value  05/30/2017 3.9   Magnesium (mg/dL)  Date Value  40/98/119103/25/2019 1.9  ] Phosphorus (mg/dL)  Date Value  47/82/956203/26/2019 3.9  ] Estimated Creatinine Clearance: 101.4 mL/min (by C-G formula based on SCr of 0.7 mg/dL).   Medications:  Infusions:  . magnesium sulfate 1 - 4 g bolus IVPB      Assessment: 51 yom with PVD now s/p aortoiliac bypass graft. Patient was hyperkalemic on admission. K has now normalized and creatinine clearance is improving. Pharmacy consulted to manage electrolytes - specifically to keep potassium above 4.   Goal of Therapy:  K 4 to 5 Mg: 1.8-2.4 Phos: 2.5 - 4.6   Plan:  3/25 K+: 3.9. Goal >4.  Will order KCL 40mEq x 1 dose.    K+ with AM labs and continue to replace as needed.    Gardner CandleSheema M Kincaid Tiger, PharmD, BCPS Clinical Pharmacist 05/30/2017 7:56 AM

## 2017-05-30 NOTE — Progress Notes (Addendum)
Discharge teaching given to patient, patient verbalized understanding and had no questions. Patient IV removed. Patient will be transported home by family. All patient belongings gathered prior to leaving. Patient requested to walk to car and did not want a wheelchair.  Prescription for Plavix was not printed nor sent electronic, called Plavix into [facility] [address] [place] Message left on automated service to order new prescription per verbal request Dr. Wyn Quakerew.

## 2017-05-30 NOTE — Discharge Instructions (Signed)
Patient may shower. Keep incision clean and dry.

## 2017-05-31 ENCOUNTER — Other Ambulatory Visit (INDEPENDENT_AMBULATORY_CARE_PROVIDER_SITE_OTHER): Payer: Self-pay

## 2017-05-31 MED ORDER — CLOPIDOGREL BISULFATE 75 MG PO TABS: 75.0000 mg | ORAL_TABLET | Freq: Every day | ORAL | 5 refills | Status: DC

## 2017-05-31 NOTE — Discharge Summary (Signed)
[facility]    Discharge Summary   Patient ID:  [name] MRN: [id number] DOB/AGE: [birth year] 52 y.o.  Admit date: 05/22/2017 Discharge date: 05/31/2017 Date of Surgery: 05/22/2017 Surgeon: Surgeon(s): Schnier, Latina Craver, MD Wyn Quaker Marlow Baars, MD  Admission Diagnosis: ATHEROSCLEROSIS WITH CLAUDICATION  Discharge Diagnoses:  ATHEROSCLEROSIS WITH CLAUDICATION  Secondary Diagnoses: Past Medical History:  Diagnosis Date  . Leg pain   . Low back pain 04/04/2017  . 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  On on May 22, 2017, the patient underwent Aortobiiliac bypass graft with a 14 x 7 bifurcated Dacron graft, Aortic endarterectomy, Right common and internal iliac artery endarterectomy, Left common and internal iliac artery endarterectomy with Reimplantation of the IMA into aortic bypass graft.  The patient tolerated the procedure and was transferred to the recovery room in ICU without complication.  The patient's hospital course was unremarkable.  The patient's NG tube was removed and his diet was slowly advanced without issue once his bowel function returned.  The patient's Foley was removed and he was able to urinate independently.  The patient received both physical and occupational therapy as an inpatient and upon discharge was ambulating independently.  The patient's pain was controlled with p.o. pain medication.  Hospital Course:  [name] is a 52 y.o. male is S/P:  Procedure(s): AORTA BIFEMORAL BYPASS GRAFT, Aortic bilateral, and internal ischemia INSERTION CENTRAL LINE ADULT Left Internal Jugular  Extubated: POD #  0  Physical exam:  A&Ox3, NAD CV: RRR Pulm: CTA Bilaterally Abdomen: Soft, nontender, nondistended, positive bowel sounds, incision clean dry and intact Vascular: 2+ pedal pulses to the bilateral feet  Post-op wounds clean, dry, intact or healing well  Pt. Ambulating, voiding and taking PO diet without difficulty.  Pt pain controlled with PO pain meds.  Labs as below  Complications:none  Consults: None  Significant Diagnostic Studies: CBC Lab Results  Component Value Date   WBC 5.2 05/29/2017   HGB 12.2 (L) 05/29/2017   HCT 35.4 (L) 05/29/2017   MCV 86.0 05/29/2017   PLT 297 05/29/2017   BMET    Component Value Date/Time   NA 140 05/27/2017 0454   NA 141 04/11/2017 0912   K 3.9 05/30/2017 0440   CL 110 05/27/2017 0454   CO2 23 05/27/2017 0454   GLUCOSE 100 (H) 05/27/2017 0454   BUN 14 05/27/2017 0454   BUN 11 04/11/2017 0912   CREATININE 0.70 05/27/2017 0454   CALCIUM 8.0 (L) 05/27/2017 0454   GFRNONAA >60 05/27/2017 0454   GFRAA >60 05/27/2017 0454   COAG Lab Results  Component Value Date   INR 1.04 05/21/2017   Disposition:  Discharge to :Home  Allergies as of 05/30/2017   No Known Allergies     Medication List    TAKE these medications   aspirin 81 MG EC tablet Take 1 tablet (81 mg total) by mouth daily.   clopidogrel 75 MG tablet Commonly known as:  PLAVIX Take 1 tablet (75 mg total) by mouth daily.   oxyCODONE 5 MG immediate release tablet Commonly known as:  ROXICODONE One to Two Tabs By Mouth Every Six Hours As Needed For Pain   TYLENOL 325 MG tablet Generic drug:  acetaminophen Take 650 mg by mouth daily as needed for moderate pain.   vitamin C 500 MG tablet Commonly known as:  ASCORBIC ACID Take 500 mg by mouth 2 (two) times a week.   Vitamin D 2000 units tablet Take 2,000 Units by mouth 2 (two) times a week.      Verbal and written Discharge instructions given to the patient. Wound care per Discharge AVS Follow-up  Information    Schnier, Latina Craver, MD On 06/06/2017.   Specialties:  Vascular Surgery, Cardiology, Radiology, Vascular Surgery Why:  First Post-Op Check. Incision Check.Please go at 9:30am you will see a PA. Contact information: [address] [place] [address] [phone]          Signed: Tonette Lederer, PA-C  05/31/2017, 10:13 AM

## 2017-06-03 ENCOUNTER — Telehealth: Payer: Self-pay

## 2017-06-03 NOTE — Telephone Encounter (Signed)
Flagged on EMMI report for not knowing who to call about changes in condition and for having other questions/problems.  1st attempt to reach made 06/03/17 at 11:14am, however had to leave message encouraging patient to return call.   Will attempt at later time.

## 2017-06-04 ENCOUNTER — Telehealth (INDEPENDENT_AMBULATORY_CARE_PROVIDER_SITE_OTHER): Payer: Self-pay

## 2017-06-04 NOTE — Telephone Encounter (Signed)
Reached patient upon 2nd attempt.  He states he is having stomach issues related to his surgery. Patient reports having only one bowel movement since discharge.  He said he is waiting on his son to come home so he can call his doctor as he would need him to interpret for him (does understand and speak English, though limited).  Encouraged him to call [facility]'s office as soon as son available to assist with call (should be there within then hour or so per patient).  He currently has a follow up appointment with them on 06/06/17.  I thanked him for his time and informed him that he would receive one more automated call in the next few days checking on him a final time.

## 2017-06-04 NOTE — Telephone Encounter (Signed)
Patient's son called stating the patient is experiencing abdominal pain and that he had not had a BM since 05/30/17 the day he was discharged. The patient has an appt on 06/06/17 with Raul DelKim Stegmayer PA. Per Dr. Gilda CreaseSchnier the patient is to take Prune juice  ( 6 oz ),  Milk of Magnesia ( 2 T. ) and Doculax suppository ( 1 ).  Mix Milk of Magnesia and Prune juice and drink warm then insert one Doculax suppository, call us tomorrow if this doesn't help. Patient's son was given this recommendation.

## 2017-06-06 ENCOUNTER — Ambulatory Visit (INDEPENDENT_AMBULATORY_CARE_PROVIDER_SITE_OTHER): Payer: BLUE CROSS/BLUE SHIELD | Admitting: Vascular Surgery

## 2017-06-06 ENCOUNTER — Encounter (INDEPENDENT_AMBULATORY_CARE_PROVIDER_SITE_OTHER): Payer: Self-pay | Admitting: Vascular Surgery

## 2017-06-06 DIAGNOSIS — I7409 Other arterial embolism and thrombosis of abdominal aorta: Secondary | ICD-10-CM

## 2017-06-06 DIAGNOSIS — I70223 Atherosclerosis of native arteries of extremities with rest pain, bilateral legs: Secondary | ICD-10-CM

## 2017-06-06 MED ORDER — DOCUSATE SODIUM 100 MG PO CAPS
100.0000 mg | ORAL_CAPSULE | Freq: Three times a day (TID) | ORAL | 5 refills | Status: DC
Start: 2017-06-06 — End: 2021-06-26

## 2017-06-06 MED ORDER — OXYCODONE HCL 5 MG PO TABS: ORAL_TABLET | ORAL | 0 refills | Status: DC

## 2017-06-08 ENCOUNTER — Encounter (INDEPENDENT_AMBULATORY_CARE_PROVIDER_SITE_OTHER): Payer: Self-pay | Admitting: Vascular Surgery

## 2017-06-08 NOTE — Progress Notes (Signed)
Subjective:    Patient ID: [name], male    DOB: [birth year], 52 y.o.   MRN: [id number] Chief Complaint  Patient presents with  . Follow-up    Post op incision check   Patient presents for his first postoperative follow-up.  On May 22, 2017, the patient underwent an aortobiiliac bypass graft with a 14 x 7 bifurcated Dacron graft, Aortic endarterectomy, Right common and internal iliac artery endarterectomy, Left common and internal iliac artery endarterectomy, Reimplantation of the IMA into aortic bypass graft.  The patient was seen with his son.  The patient is still experiencing some incisional pain, constipation and a "bloating" type feeling.  The patient notes that the claudication/rest pain he was experiencing prior to his surgical intervention has now resolved.  The patient denies any issues with his incision.  The patient denies any fever, nausea or vomiting.  The patient is passing gas.  Review of Systems  Constitutional: Negative.   HENT: Negative.   Eyes: Negative.   Respiratory: Negative.   Cardiovascular: Negative.   Gastrointestinal: Positive for abdominal distention and abdominal pain.  Endocrine: Negative.   Genitourinary: Negative.   Musculoskeletal: Negative.   Skin: Negative.   Allergic/Immunologic: Negative.   Neurological: Negative.   Hematological: Negative.   Psychiatric/Behavioral: Negative.       Objective:   Physical Exam  Constitutional: He is oriented to person, place, and time. He appears well-developed and well-nourished. No distress.  HENT:  Head: Normocephalic and atraumatic.  Eyes: Pupils are equal, round, and reactive to light. Conjunctivae are normal.  Neck: Normal range of motion.  Cardiovascular: Normal rate, regular rhythm, normal heart sounds and intact distal pulses.  Pulses:      Radial pulses are 2+ on the right side, and 2+ on the left side.       Dorsalis pedis pulses are 2+ on the right side, and 2+ on the left side.   Posterior tibial pulses are 2+ on the right side, and 2+ on the left side.  Pulmonary/Chest: Effort normal and breath sounds normal.  Abdominal: Soft. Bowel sounds are normal. He exhibits no distension. There is no tenderness. There is no rebound.  Every other staple removed.  Incision is clean dry and intact.  Musculoskeletal: Normal range of motion. He exhibits no edema.  Neurological: He is alert and oriented to person, place, and time.  Skin: Skin is warm. He is not diaphoretic.  Psychiatric: He has a normal mood and affect. His behavior is normal. Judgment and thought content normal.  Vitals reviewed.  There were no vitals taken for this visit.  Past Medical History:  Diagnosis Date  . Leg pain   . Low back pain 04/04/2017  . Patient denies medical problems    Social History   Socioeconomic History  . Marital status: Married    Spouse name: Not on file  . Number of children: Not on file  . Years of education: Not on file  . Highest education level: Not on file  Occupational History  . Not on file  Social Needs  . Financial resource strain: Not on file  . Food insecurity:    Worry: Not on file    Inability: Not on file  . Transportation needs:    Medical: Not on file    Non-medical: Not on file  Tobacco Use  . Smoking status: Former Smoker    Packs/day: 0.25    Years: 30.00    Pack years: 7.50    Types:  Cigarettes    Last attempt to quit: 03/31/2017    Years since quitting: 0.1  . Smokeless tobacco: Never Used  Substance and Sexual Activity  . Alcohol use: No  . Drug use: No  . Sexual activity: Not on file  Lifestyle  . Physical activity:    Days per week: Not on file    Minutes per session: Not on file  . Stress: Not on file  Relationships  . Social connections:    Talks on phone: Not on file    Gets together: Not on file    Attends religious service: Not on file    Active member of club or organization: Not on file    Attends meetings of clubs or  organizations: Not on file    Relationship status: Not on file  . Intimate partner violence:    Fear of current or ex partner: Not on file    Emotionally abused: Not on file    Physically abused: Not on file    Forced sexual activity: Not on file  Other Topics Concern  . Not on file  Social History Narrative  . Not on file   Past Surgical History:  Procedure Laterality Date  . AORTA - BILATERAL FEMORAL ARTERY BYPASS GRAFT N/A 05/22/2017   Procedure: AORTA BIFEMORAL BYPASS GRAFT, Aortic bilateral, and internal ischemia;  Surgeon: Renford DillsSchnier, Gregory G, MD;  Location: [facility] ORS;  Service: Vascular;  Laterality: N/A;  . CENTRAL VENOUS CATHETER INSERTION Left 05/22/2017   Procedure: INSERTION CENTRAL LINE ADULT Left Internal Jugular;  Surgeon: Renford DillsSchnier, Gregory G, MD;  Location: [facility] ORS;  Service: Vascular;  Laterality: Left;  . COLONOSCOPY WITH PROPOFOL N/A 03/27/2017   Procedure: COLONOSCOPY WITH PROPOFOL;  Surgeon: Earline MayotteByrnett, Jeffrey W, MD;  Location: [facility] ENDOSCOPY;  Service: Endoscopy;  Laterality: N/A;  . KIDNEY STONE SURGERY    . KNEE SURGERY  2006   Family History  Problem Relation Age of Onset  . Diabetes Mother   . Heart disease Mother   . Alzheimer's disease Father    No Known Allergies     Assessment & Plan:  Patient presents for his first postoperative follow-up.  On May 22, 2017, the patient underwent an aortobiiliac bypass graft with a 14 x 7 bifurcated Dacron graft, Aortic endarterectomy, Right common and internal iliac artery endarterectomy, Left common and internal iliac artery endarterectomy, Reimplantation of the IMA into aortic bypass graft.  The patient was seen with his son.  The patient is still experiencing some incisional pain, constipation and a "bloating" type feeling.  The patient notes that the claudication/rest pain he was experiencing prior to his surgical intervention has now resolved.  The patient denies any issues with his incision.  The patient denies any  fever, nausea or vomiting.  The patient is passing gas.  1. Chronic distal aortic occlusion (HCC) - Stable The patient presents for his first postoperative follow-up The patient's postoperative course has been unremarkable The patient is still experiencing some incisional pain, constipation which I believe is from continued use of narcotic pain medication and a bloated feeling. The patient is passing gas, he denies any nausea vomiting. I will bring the patient back in 1 week to remove the rest of his staples and undergo an aortoiliac duplex for his first postoperative ultrasound The patient was given one last prescription for pain oxycodone 5 mg 1-2 tabs every 4-6 hours #50 The patient was also given a prescription for Colace 100 mg 1 tab 3 times a day #  90 with 5 refills  - VAS US AORTA/IVC/ILIACS; Future  2. 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 acetaminophen (TYLENOL) 325 MG tablet Take 650 mg by mouth daily as needed for moderate pain.    Marland Kitchen aspirin EC 81 MG EC tablet Take 1 tablet (81 mg total) by mouth daily.    . Cholecalciferol (VITAMIN D) 2000 units tablet Take 2,000 Units by mouth 2 (two) times a week.    . clopidogrel (PLAVIX) 75 MG tablet Take 1 tablet (75 mg total) by mouth daily. 30 tablet 5  . vitamin C (ASCORBIC ACID) 500 MG tablet Take 500 mg by mouth 2 (two) times a week.     No current facility-administered medications on file prior to visit.    There are no Patient Instructions on file for this visit. No follow-ups on file.  Madi Bonfiglio A Jacayla Nordell, PA-C

## 2017-06-13 ENCOUNTER — Ambulatory Visit (INDEPENDENT_AMBULATORY_CARE_PROVIDER_SITE_OTHER): Payer: BLUE CROSS/BLUE SHIELD

## 2017-06-13 ENCOUNTER — Ambulatory Visit (INDEPENDENT_AMBULATORY_CARE_PROVIDER_SITE_OTHER): Payer: BLUE CROSS/BLUE SHIELD | Admitting: Vascular Surgery

## 2017-06-13 ENCOUNTER — Encounter (INDEPENDENT_AMBULATORY_CARE_PROVIDER_SITE_OTHER): Payer: Self-pay | Admitting: Vascular Surgery

## 2017-06-13 VITALS — BP 112/70 | HR 73 | Resp 16 | Ht 65.0 in | Wt 150.6 lb

## 2017-06-13 DIAGNOSIS — I7409 Other arterial embolism and thrombosis of abdominal aorta: Secondary | ICD-10-CM

## 2017-06-13 DIAGNOSIS — I70223 Atherosclerosis of native arteries of extremities with rest pain, bilateral legs: Secondary | ICD-10-CM

## 2017-06-13 NOTE — Progress Notes (Signed)
ERROR/DUPLICATE 

## 2017-06-13 NOTE — Progress Notes (Signed)
Subjective:    Patient ID: [name], male    DOB: 03-Aug-1965, 52 y.o.   MRN: [id number] Chief Complaint  Patient presents with  . Follow-up    1week abi,aorta iliac   The patient presents for his second postoperative follow-up. On May 22, 2017, the patient underwent an aortobiiliac bypass graft with a 14 x 7 bifurcated Dacron graft, Aortic endarterectomy, Right common and internal iliac artery endarterectomy, Left common and internal iliac artery endarterectomy, Reimplantation of the IMA into aortic bypass graft.  The patient's constipation has improved and he is experiencing more frequent bowel movements.  He denies any fever, nausea or vomiting.  The patient's bilateral lower extremity claudication has resolved since surgery.  The patient's incisional pain is slowly improving.  The patient underwent an aortic iliac duplex which was notable for triphasic waveforms to the bilateral grafts, patent aorto biiliac bypass graft without evidence of restenosis.  The patient underwent a bilateral ABI which was notable for bilateral triphasic tibials and normal great toe waveforms bilaterally.  Right ABI 1.13 and left ABI 1.11.  Review of Systems  Constitutional: Negative.   HENT: Negative.   Eyes: Negative.   Respiratory: Negative.   Cardiovascular: Negative.   Gastrointestinal: Negative.   Endocrine: Negative.   Genitourinary: Negative.   Musculoskeletal: Negative.   Skin: Negative.   Allergic/Immunologic: Negative.   Neurological: Negative.   Hematological: Negative.   Psychiatric/Behavioral: Negative.       Objective:   Physical Exam  Constitutional: He is oriented to person, place, and time. He appears well-developed and well-nourished. No distress.  HENT:  Head: Normocephalic and atraumatic.  Eyes: Pupils are equal, round, and reactive to light. Conjunctivae are normal.  Neck: Normal range of motion.  Cardiovascular: Normal rate, regular rhythm, normal heart sounds and  intact distal pulses.  Pulses:      Radial pulses are 2+ on the right side, and 2+ on the left side.       Dorsalis pedis pulses are 2+ on the right side, and 2+ on the left side.       Posterior tibial pulses are 2+ on the right side, and 2+ on the left side.  Pulmonary/Chest: Effort normal and breath sounds normal.  Abdominal: Soft. Bowel sounds are normal. He exhibits no distension. There is no tenderness. There is no rebound.  Remaining staples removed.  Incision is clean dry and intact.  There is no drainage.  There is no swelling.  It is healing well.  Musculoskeletal: Normal range of motion. He exhibits no edema.  Neurological: He is alert and oriented to person, place, and time.  Skin: Skin is warm and dry. He is not diaphoretic.  Psychiatric: He has a normal mood and affect. His behavior is normal. Judgment and thought content normal.  Vitals reviewed.  BP 112/70 (BP Location: Right Arm)   Pulse 73   Resp 16   Ht 5\' 5"  (1.651 m)   Wt 150 lb 9.6 oz (68.3 kg)   BMI 25.06 kg/m   Past Medical History:  Diagnosis Date  . Leg pain   . Low back pain 04/04/2017  . Patient denies medical problems    Social History   Socioeconomic History  . Marital status: Married    Spouse name: Not on file  . Number of children: Not on file  . Years of education: Not on file  . Highest education level: Not on file  Occupational History  . Not on file  Social Needs  .  Financial resource strain: Not on file  . Food insecurity:    Worry: Not on file    Inability: Not on file  . Transportation needs:    Medical: Not on file    Non-medical: Not on file  Tobacco Use  . Smoking status: Former Smoker    Packs/day: 0.25    Years: 30.00    Pack years: 7.50    Types: Cigarettes    Last attempt to quit: 03/31/2017    Years since quitting: 0.2  . Smokeless tobacco: Never Used  Substance and Sexual Activity  . Alcohol use: No  . Drug use: No  . Sexual activity: Not on file  Lifestyle  .  Physical activity:    Days per week: Not on file    Minutes per session: Not on file  . Stress: Not on file  Relationships  . Social connections:    Talks on phone: Not on file    Gets together: Not on file    Attends religious service: Not on file    Active member of club or organization: Not on file    Attends meetings of clubs or organizations: Not on file    Relationship status: Not on file  . Intimate partner violence:    Fear of current or ex partner: Not on file    Emotionally abused: Not on file    Physically abused: Not on file    Forced sexual activity: Not on file  Other Topics Concern  . Not on file  Social History Narrative  . Not on file   Past Surgical History:  Procedure Laterality Date  . AORTA - BILATERAL FEMORAL ARTERY BYPASS GRAFT N/A 05/22/2017   Procedure: AORTA BIFEMORAL BYPASS GRAFT, Aortic bilateral, and internal ischemia;  Surgeon: Renford Dills, MD;  Location: [facility];  Service: Vascular;  Laterality: N/A;  . CENTRAL VENOUS CATHETER INSERTION Left 05/22/2017   Procedure: INSERTION CENTRAL LINE ADULT Left Internal Jugular;  Surgeon: Renford Dills, MD;  Location: [facility];  Service: Vascular;  Laterality: Left;  . COLONOSCOPY WITH PROPOFOL N/A 03/27/2017   Procedure: COLONOSCOPY WITH PROPOFOL;  Surgeon: Earline Mayotte, MD;  Location: [facility];  Service: Endoscopy;  Laterality: N/A;  . KIDNEY STONE SURGERY    . KNEE SURGERY  2006   Family History  Problem Relation Age of Onset  . Diabetes Mother   . Heart disease Mother   . Alzheimer's disease Father    No Known Allergies     Assessment & Plan:  The patient presents for his second postoperative follow-up. On May 22, 2017, the patient underwent an aortobiiliac bypass graft with a 14 x 7 bifurcated Dacron graft, Aortic endarterectomy, Right common and internal iliac artery endarterectomy, Left common and internal iliac artery endarterectomy, Reimplantation of the IMA into aortic  bypass graft.  The patient's constipation has improved and he is experiencing more frequent bowel movements.  He denies any fever, nausea or vomiting.  The patient's bilateral lower extremity claudication has resolved since surgery.  The patient's incisional pain is slowly improving.  The patient underwent an aortic iliac duplex which was notable for triphasic waveforms to the bilateral grafts, patent aorto biiliac bypass graft without evidence of restenosis.  The patient underwent a bilateral ABI which was notable for bilateral triphasic tibials and normal great toe waveforms bilaterally.  Right ABI 1.13 and left ABI 1.11.  1. Atherosclerosis of native artery of both lower extremities with rest pain (HCC) - Resovled The patient's  bilateral lower extremity claudication has resolved since surgery. The patient denies any claudication-like symptoms, rest pain or ulceration to the bilateral lower extremity Strong pedal pulses noted bilaterally  - VAS US ABI WITH/WO TBI; Future  2. Chronic distal aortic occlusion (HCC) - Stable Patent aortobiiliac bypass on duplex today Patient with improving constipation Patient with improving incisional pain Remaining staples removed without complication Patient to follow-up in 3 months for a repeat aortoiliac duplex and ABI  - VAS US AORTA/IVC/ILIACS; Future  Current Outpatient Medications on File Prior to Visit  Medication Sig Dispense Refill  . acetaminophen (TYLENOL) 325 MG tablet Take 650 mg by mouth daily as needed for moderate pain.    Marland Kitchen. aspirin EC 81 MG EC tablet Take 1 tablet (81 mg total) by mouth daily.    . Cholecalciferol (VITAMIN D) 2000 units tablet Take 2,000 Units by mouth 2 (two) times a week.    . clopidogrel (PLAVIX) 75 MG tablet Take 1 tablet (75 mg total) by mouth daily. 30 tablet 5  . docusate sodium (COLACE) 100 MG capsule Take 1 capsule (100 mg total) by mouth 3 (three) times daily. 90 capsule 5  . oxyCODONE (ROXICODONE) 5 MG immediate  release tablet One to Two Tabs By Mouth Every Six Hours As Needed For Pain 50 tablet 0  . vitamin C (ASCORBIC ACID) 500 MG tablet Take 500 mg by mouth 2 (two) times a week.     No current facility-administered medications on file prior to visit.    There are no Patient Instructions on file for this visit. No follow-ups on file.  Mackenzy Eisenberg A Brevyn Ring, PA-C

## 2017-06-19 ENCOUNTER — Encounter (INDEPENDENT_AMBULATORY_CARE_PROVIDER_SITE_OTHER): Payer: Self-pay | Admitting: Vascular Surgery

## 2017-06-26 ENCOUNTER — Encounter (INDEPENDENT_AMBULATORY_CARE_PROVIDER_SITE_OTHER): Payer: Self-pay | Admitting: Vascular Surgery

## 2017-06-26 ENCOUNTER — Telehealth (INDEPENDENT_AMBULATORY_CARE_PROVIDER_SITE_OTHER): Payer: Self-pay | Admitting: Vascular Surgery

## 2017-06-26 NOTE — Telephone Encounter (Signed)
Patient called becasue he is wanting to go back to work as soon as possibe but hasnt heard anything Dr. Gilda CreaseSchnier.

## 2017-06-26 NOTE — Telephone Encounter (Signed)
I called the patient back and let him know that he had a major surgery and his body has to have time to heal, so he will not be able to go back to work until the middle of may. Patient stated he understood.

## 2017-06-27 ENCOUNTER — Encounter (INDEPENDENT_AMBULATORY_CARE_PROVIDER_SITE_OTHER): Payer: Self-pay | Admitting: Vascular Surgery

## 2017-08-22 ENCOUNTER — Encounter (INDEPENDENT_AMBULATORY_CARE_PROVIDER_SITE_OTHER): Payer: Self-pay | Admitting: Vascular Surgery

## 2017-09-16 ENCOUNTER — Ambulatory Visit (INDEPENDENT_AMBULATORY_CARE_PROVIDER_SITE_OTHER): Payer: BLUE CROSS/BLUE SHIELD | Admitting: Vascular Surgery

## 2017-09-16 ENCOUNTER — Encounter (INDEPENDENT_AMBULATORY_CARE_PROVIDER_SITE_OTHER): Payer: BLUE CROSS/BLUE SHIELD

## 2017-09-16 ENCOUNTER — Encounter (INDEPENDENT_AMBULATORY_CARE_PROVIDER_SITE_OTHER): Payer: Self-pay | Admitting: Vascular Surgery

## 2017-09-16 VITALS — BP 108/73 | HR 88 | Resp 17 | Ht 65.0 in | Wt 156.4 lb

## 2017-09-16 DIAGNOSIS — I70223 Atherosclerosis of native arteries of extremities with rest pain, bilateral legs: Secondary | ICD-10-CM

## 2017-09-16 NOTE — Progress Notes (Signed)
    Patient ID: [name], male   DOB: 1965/04/13, 52 y.o.   MRN: [id number]  Chief Complaint  Patient presents with  . Follow-up    check surgical site    HPI [name] is a 52 y.o. male.    He is s/p aorta bi-iliac bypass on 05/22/2017.  He is c/o a sharp pain in his incision and erectile dysfunction   Past Medical History:  Diagnosis Date  . Leg pain   . Low back pain 04/04/2017  . Patient denies medical problems     Past Surgical History:  Procedure Laterality Date  . AORTA - BILATERAL FEMORAL ARTERY BYPASS GRAFT N/A 05/22/2017   Procedure: AORTA BIFEMORAL BYPASS GRAFT, Aortic bilateral, and internal ischemia;  Surgeon: Renford DillsSchnier, Kenan Moodie G, MD;  Location: [facility];  Service: Vascular;  Laterality: N/A;  . CENTRAL VENOUS CATHETER INSERTION Left 05/22/2017   Procedure: INSERTION CENTRAL LINE ADULT Left Internal Jugular;  Surgeon: Renford DillsSchnier, Chao Blazejewski G, MD;  Location: [facility];  Service: Vascular;  Laterality: Left;  . COLONOSCOPY WITH PROPOFOL N/A 03/27/2017   Procedure: COLONOSCOPY WITH PROPOFOL;  Surgeon: Earline MayotteByrnett, Jeffrey W, MD;  Location: [facility];  Service: Endoscopy;  Laterality: N/A;  . KIDNEY STONE SURGERY    . KNEE SURGERY  2006      No Known Allergies  Current Outpatient Medications  Medication Sig Dispense Refill  . aspirin EC 81 MG EC tablet Take 1 tablet (81 mg total) by mouth daily.    Marland Kitchen. atorvastatin (LIPITOR) 10 MG tablet   1  . acetaminophen (TYLENOL) 325 MG tablet Take 650 mg by mouth daily as needed for moderate pain.    . Cholecalciferol (VITAMIN D) 2000 units tablet Take 2,000 Units by mouth 2 (two) times a week.    . clopidogrel (PLAVIX) 75 MG tablet Take 1 tablet (75 mg total) by mouth daily. (Patient not taking: Reported on 09/16/2017) 30 tablet 5  . docusate sodium (COLACE) 100 MG capsule Take 1 capsule (100 mg total) by mouth 3 (three) times daily. (Patient not taking: Reported on 09/16/2017) 90 capsule 5  . oxyCODONE (ROXICODONE)  5 MG immediate release tablet One to Two Tabs By Mouth Every Six Hours As Needed For Pain (Patient not taking: Reported on 09/16/2017) 50 tablet 0  . vitamin C (ASCORBIC ACID) 500 MG tablet Take 500 mg by mouth 2 (two) times a week.     No current facility-administered medications for this visit.         Physical Exam BP 108/73 (BP Location: Right Arm)   Pulse 88   Resp 17   Ht 5\' 5"  (1.651 m)   Wt 156 lb 6.4 oz (70.9 kg)   BMI 26.03 kg/m  Gen:  WD/WN, NAD Skin: incision C/D/I, the knot from the PDS suture is palpable at the site of tenderness     Assessment/Plan:  1. 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  Any errors from dictation are unintentional.

## 2017-10-09 ENCOUNTER — Encounter

## 2017-10-09 ENCOUNTER — Encounter (INDEPENDENT_AMBULATORY_CARE_PROVIDER_SITE_OTHER): Payer: BLUE CROSS/BLUE SHIELD

## 2017-10-09 ENCOUNTER — Ambulatory Visit (INDEPENDENT_AMBULATORY_CARE_PROVIDER_SITE_OTHER): Payer: BLUE CROSS/BLUE SHIELD | Admitting: Vascular Surgery

## 2017-12-12 ENCOUNTER — Ambulatory Visit (INDEPENDENT_AMBULATORY_CARE_PROVIDER_SITE_OTHER): Payer: BLUE CROSS/BLUE SHIELD | Admitting: Vascular Surgery

## 2017-12-12 ENCOUNTER — Ambulatory Visit (INDEPENDENT_AMBULATORY_CARE_PROVIDER_SITE_OTHER): Payer: BLUE CROSS/BLUE SHIELD

## 2017-12-12 ENCOUNTER — Encounter (INDEPENDENT_AMBULATORY_CARE_PROVIDER_SITE_OTHER): Payer: Self-pay | Admitting: Vascular Surgery

## 2017-12-12 VITALS — BP 108/72 | HR 72 | Resp 15 | Ht 65.0 in | Wt 161.0 lb

## 2017-12-12 DIAGNOSIS — N5319 Other ejaculatory dysfunction: Secondary | ICD-10-CM | POA: Diagnosis not present

## 2017-12-12 DIAGNOSIS — F17211 Nicotine dependence, cigarettes, in remission: Secondary | ICD-10-CM | POA: Diagnosis not present

## 2017-12-12 DIAGNOSIS — I70223 Atherosclerosis of native arteries of extremities with rest pain, bilateral legs: Secondary | ICD-10-CM

## 2017-12-12 DIAGNOSIS — I7409 Other arterial embolism and thrombosis of abdominal aorta: Secondary | ICD-10-CM | POA: Diagnosis not present

## 2017-12-12 DIAGNOSIS — I739 Peripheral vascular disease, unspecified: Secondary | ICD-10-CM | POA: Diagnosis not present

## 2017-12-12 DIAGNOSIS — E782 Mixed hyperlipidemia: Secondary | ICD-10-CM | POA: Diagnosis not present

## 2017-12-12 NOTE — Progress Notes (Signed)
MRN : [id number]  [name] is a 52 y.o. (1965-10-26) male who presents with chief complaint of  Chief Complaint  Patient presents with  . Follow-up    FOllow up Aortic study  .  History of Present Illness:   The patient returns for 6 month follow up s/p aorta bi-femoral bypass on 05/22/2017.  The patient returns to the office for followup and review of the noninvasive studies. There have been no interval changes in lower extremity symptoms. No interval shortening of the patient's claudication distance or development of rest pain symptoms. No new ulcers or wounds have occurred since the last visit.  His incisional pain has completely resolved  There have been no significant changes to the patient's overall health care.  The patient denies amaurosis fugax or recent TIA symptoms. There are no recent neurological changes noted. The patient denies history of DVT, PE or superficial thrombophlebitis. The patient denies recent episodes of angina or shortness of breath.   ABI Rt=1.05 and Lt=0.99  (previous ABI's Rt=1.13 and Lt=1.11) Duplex ultrasound of the bypass graft demonstrates a widely patent bypass graft no evidence of stenosis at the proximal or distal anastomoses  No outpatient medications have been marked as taking for the 12/12/17 encounter (Office Visit) with Gilda Crease, Latina Craver, MD.    Past Medical History:  Diagnosis Date  . Leg pain   . Low back pain 04/04/2017  . Patient denies medical problems     Past Surgical History:  Procedure Laterality Date  . AORTA - BILATERAL FEMORAL ARTERY BYPASS GRAFT N/A 05/22/2017   Procedure: AORTA BIFEMORAL BYPASS GRAFT, Aortic bilateral, and internal ischemia;  Surgeon: Renford Dills, MD;  Location: [facility];  Service: Vascular;  Laterality: N/A;  . CENTRAL VENOUS CATHETER INSERTION Left 05/22/2017   Procedure: INSERTION CENTRAL LINE ADULT Left Internal Jugular;  Surgeon: Renford Dills, MD;  Location: [facility];   Service: Vascular;  Laterality: Left;  . COLONOSCOPY WITH PROPOFOL N/A 03/27/2017   Procedure: COLONOSCOPY WITH PROPOFOL;  Surgeon: Earline Mayotte, MD;  Location: [facility];  Service: Endoscopy;  Laterality: N/A;  . KIDNEY STONE SURGERY    . KNEE SURGERY  2006    Social History Social History   Tobacco Use  . Smoking status: Former Smoker    Packs/day: 0.25    Years: 30.00    Pack years: 7.50    Types: Cigarettes    Last attempt to quit: 03/31/2017    Years since quitting: 0.7  . Smokeless tobacco: Never Used  Substance Use Topics  . Alcohol use: No  . Drug use: No    Family History Family History  Problem Relation Age of Onset  . Diabetes Mother   . Heart disease Mother   . Alzheimer's disease Father     No Known Allergies   REVIEW OF SYSTEMS (Negative unless checked)  Constitutional: [] Weight loss  [] Fever  [] Chills Cardiac: [] Chest pain   [] Chest pressure   [] Palpitations   [] Shortness of breath when laying flat   [] Shortness of breath with exertion. Vascular:  [] Pain in legs with walking   [] Pain in legs at rest  [] History of DVT   [] Phlebitis   [] Swelling in legs   [] Varicose veins   [] Non-healing ulcers Pulmonary:   [] Uses home oxygen   [] Productive cough   [] Hemoptysis   [] Wheeze  [] COPD   [] Asthma Neurologic:  [] Dizziness   [] Seizures   [] History of stroke   [] History of TIA  [] Aphasia   []   Vissual changes   [] Weakness or numbness in arm   [] Weakness or numbness in leg Musculoskeletal:   [] Joint swelling   [] Joint pain   [] Low back pain Hematologic:  [] Easy bruising  [] Easy bleeding   [] Hypercoagulable state   [] Anemic Gastrointestinal:  [] Diarrhea   [] Vomiting  [] Gastroesophageal reflux/heartburn   [] Difficulty swallowing. Genitourinary:  [] Chronic kidney disease   [] Difficult urination  [] Frequent urination   [] Blood in urine Skin:  [] Rashes   [] Ulcers  Psychological:  [] History of anxiety   []  History of major depression.  Physical  Examination  Vitals:   12/12/17 0852  BP: 108/72  Pulse: 72  Resp: 15  Weight: 161 lb (73 kg)  Height: 5\' 5"  (1.651 m)   Body mass index is 26.79 kg/m. Gen: WD/WN, NAD Head: [place]/AT, No temporalis wasting.  Ear/Nose/Throat: Hearing grossly intact, nares w/o erythema or drainage Eyes: PER, EOMI, sclera nonicteric.  Neck: Supple, no large masses.   Pulmonary:  Good air movement, no audible wheezing bilaterally, no use of accessory muscles.  Cardiac: RRR, no JVD Vascular:  Vessel Right Left  Radial Palpable Palpable  PT Palpable Palpable  DP Palpable Palpable  Gastrointestinal: Non-distended. No guarding/no peritoneal signs.  Musculoskeletal: M/S 5/5 throughout.  No deformity or atrophy.  Neurologic: CN 2-12 intact. Symmetrical.  Speech is fluent. Motor exam as listed above. Psychiatric: Judgment intact, Mood & affect appropriate for pt's clinical situation. Dermatologic: No rashes or ulcers noted.  No changes consistent with cellulitis. Lymph : No lichenification or skin changes of chronic lymphedema.  CBC Lab Results  Component Value Date   WBC 5.2 05/29/2017   HGB 12.2 (L) 05/29/2017   HCT 35.4 (L) 05/29/2017   MCV 86.0 05/29/2017   PLT 297 05/29/2017    BMET    Component Value Date/Time   NA 140 05/27/2017 0454   NA 141 04/11/2017 0912   K 3.9 05/30/2017 0440   CL 110 05/27/2017 0454   CO2 23 05/27/2017 0454   GLUCOSE 100 (H) 05/27/2017 0454   BUN 14 05/27/2017 0454   BUN 11 04/11/2017 0912   CREATININE 0.70 05/27/2017 0454   CALCIUM 8.0 (L) 05/27/2017 0454   GFRNONAA >60 05/27/2017 0454   GFRAA >60 05/27/2017 0454   CrCl cannot be calculated (Patient's most recent lab result is older than the maximum 21 days allowed.).  COAG Lab Results  Component Value Date   INR 1.04 05/21/2017    Radiology No results found.   Assessment/Plan 1. PAD (peripheral artery disease) (HCC)  Recommend:  The patient has evidence of atherosclerosis of the lower  extremities status post successful aortobifemoral bypass graft.  He states he is not having any pain and can walk as far as he wants with no difficulty. The patient does not voice lifestyle limiting changes at this point in time.  Noninvasive studies do not suggest clinically significant change.  No invasive studies, angiography or surgery at this time The patient should continue walking and begin a more formal exercise program.  The patient should continue antiplatelet therapy and aggressive treatment of the lipid abnormalities  No changes in the patient's medications at this time  The patient should continue wearing graduated compression socks 10-15 mmHg strength to control the mild edema.   - VAS US AORTA/IVC/ILIACS; Future - VAS [place] ABI WITH/WO TBI; Future  2. Mixed hyperlipidemia Continue statin as ordered and reviewed, no changes at this time   3. Ejaculatory disorder This continues to be his #1 concern following his surgery.  He was seen by urology and was started on Sudafed but this has not helped.  With the help of the interpreter today's appointment gave some clarity.  Initially it sounded as though he were having retrograde ejaculation and was concerned that this was not normal.  Today he is stating that he is able to have an erection but is unable to have an orgasm.  He is requesting a second opinion.  - Ambulatory referral to Urology   Levora Dredge, MD  12/12/2017 9:06 AM

## 2017-12-13 ENCOUNTER — Encounter (INDEPENDENT_AMBULATORY_CARE_PROVIDER_SITE_OTHER): Payer: Self-pay | Admitting: Vascular Surgery

## 2017-12-13 DIAGNOSIS — E785 Hyperlipidemia, unspecified: Secondary | ICD-10-CM | POA: Insufficient documentation

## 2017-12-13 DIAGNOSIS — N5319 Other ejaculatory dysfunction: Secondary | ICD-10-CM | POA: Insufficient documentation

## 2018-01-09 ENCOUNTER — Encounter (INDEPENDENT_AMBULATORY_CARE_PROVIDER_SITE_OTHER): Payer: BLUE CROSS/BLUE SHIELD

## 2018-01-16 ENCOUNTER — Ambulatory Visit: Payer: Self-pay | Admitting: Urology

## 2018-01-16 ENCOUNTER — Encounter (INDEPENDENT_AMBULATORY_CARE_PROVIDER_SITE_OTHER): Payer: BLUE CROSS/BLUE SHIELD

## 2018-01-16 ENCOUNTER — Ambulatory Visit (INDEPENDENT_AMBULATORY_CARE_PROVIDER_SITE_OTHER): Payer: BLUE CROSS/BLUE SHIELD | Admitting: Vascular Surgery

## 2018-06-16 ENCOUNTER — Encounter (INDEPENDENT_AMBULATORY_CARE_PROVIDER_SITE_OTHER): Payer: BLUE CROSS/BLUE SHIELD

## 2018-06-16 ENCOUNTER — Ambulatory Visit (INDEPENDENT_AMBULATORY_CARE_PROVIDER_SITE_OTHER): Payer: BLUE CROSS/BLUE SHIELD | Admitting: Vascular Surgery

## 2020-04-18 ENCOUNTER — Other Ambulatory Visit (INDEPENDENT_AMBULATORY_CARE_PROVIDER_SITE_OTHER): Payer: Self-pay | Admitting: Nurse Practitioner

## 2020-04-18 DIAGNOSIS — I739 Peripheral vascular disease, unspecified: Secondary | ICD-10-CM

## 2020-04-19 ENCOUNTER — Ambulatory Visit (INDEPENDENT_AMBULATORY_CARE_PROVIDER_SITE_OTHER): Payer: 59 | Admitting: Vascular Surgery

## 2020-04-19 ENCOUNTER — Other Ambulatory Visit: Payer: Self-pay

## 2020-04-19 ENCOUNTER — Ambulatory Visit (INDEPENDENT_AMBULATORY_CARE_PROVIDER_SITE_OTHER): Payer: 59

## 2020-04-19 VITALS — BP 117/78 | HR 101 | Ht 65.0 in | Wt 164.0 lb

## 2020-04-19 DIAGNOSIS — E782 Mixed hyperlipidemia: Secondary | ICD-10-CM

## 2020-04-19 DIAGNOSIS — M79604 Pain in right leg: Secondary | ICD-10-CM | POA: Diagnosis not present

## 2020-04-19 DIAGNOSIS — M79605 Pain in left leg: Secondary | ICD-10-CM | POA: Diagnosis not present

## 2020-04-19 DIAGNOSIS — I739 Peripheral vascular disease, unspecified: Secondary | ICD-10-CM

## 2020-04-19 NOTE — Progress Notes (Signed)
Patient ID: [name], male   DOB: 03-13-1965, 55 y.o.   MRN: [id number]  Chief Complaint  Patient presents with  . New Patient (Initial Visit)    PVD Claudication ABI    HPI [name] is a 55 y.o. male.  I am asked to see the patient by Dr. Dario Guardian for evaluation of claudication symptoms in a patient with a previous history of aortobifemoral bypass for peripheral arterial disease.  It has been almost 3 years since he was seen in our office last.  His surgery was in early 2019.  For the first year or so after surgery, his legs did fine without any major symptoms or problems.  Over the past year or so he has had recurrent claudication symptoms.  Both legs are affected.  He has numbness and pain in his legs when he walks.  He also describes numbness and pain in his hands with activity.  No ulceration or rest pain.  No infection.  Resting reliably stops the pain.  He has previously had an MRI of his back which showed some abnormalities but when that was done he was told that was not severe enough to cause the symptoms.  Particularly given his previous history of aortobifemoral bypass, PAD was suspected. To evaluate his perfusion noninvasive studies were performed today.  These demonstrate a right ABI of 1.08 and a left ABI of 1.05 with brisk triphasic waveforms and normal digital pressures bilaterally consistent with no arterial insufficiency.   Past Medical History:  Diagnosis Date  . Leg pain   . Low back pain 04/04/2017  . Patient denies medical problems     Past Surgical History:  Procedure Laterality Date  . AORTA - BILATERAL FEMORAL ARTERY BYPASS GRAFT N/A 05/22/2017   Procedure: AORTA BIFEMORAL BYPASS GRAFT, Aortic bilateral, and internal ischemia;  Surgeon: Renford Dills, MD;  Location: [facility] ORS;  Service: Vascular;  Laterality: N/A;  . CENTRAL VENOUS CATHETER INSERTION Left 05/22/2017   Procedure: INSERTION CENTRAL LINE ADULT Left Internal Jugular;  Surgeon:  Renford Dills, MD;  Location: [facility] ORS;  Service: Vascular;  Laterality: Left;  . COLONOSCOPY WITH PROPOFOL N/A 03/27/2017   Procedure: COLONOSCOPY WITH PROPOFOL;  Surgeon: Earline Mayotte, MD;  Location: [facility] ENDOSCOPY;  Service: Endoscopy;  Laterality: N/A;  . KIDNEY STONE SURGERY    . KNEE SURGERY  2006     Family History  Problem Relation Age of Onset  . Diabetes Mother   . Heart disease Mother   . Alzheimer's disease Father      Social History   Tobacco Use  . Smoking status: Former Smoker    Packs/day: 0.25    Years: 30.00    Pack years: 7.50    Types: Cigarettes    Quit date: 03/31/2017    Years since quitting: 3.0  . Smokeless tobacco: Never Used  Vaping Use  . Vaping Use: Some days  Substance Use Topics  . Alcohol use: No  . Drug use: No    No Known Allergies  Current Outpatient Medications  Medication Sig Dispense Refill  . aspirin EC 81 MG EC tablet Take 1 tablet (81 mg total) by mouth daily.    Marland Kitchen atorvastatin (LIPITOR) 10 MG tablet   1  . acetaminophen (TYLENOL) 325 MG tablet Take 650 mg by mouth daily as needed for moderate pain. (Patient not taking: Reported on 04/19/2020)    . Cholecalciferol (VITAMIN D) 2000 units tablet Take 2,000 Units by mouth 2 (two)  times a week. (Patient not taking: Reported on 04/19/2020)    . clopidogrel (PLAVIX) 75 MG tablet Take 1 tablet (75 mg total) by mouth daily. (Patient not taking: No sig reported) 30 tablet 5  . docusate sodium (COLACE) 100 MG capsule Take 1 capsule (100 mg total) by mouth 3 (three) times daily. (Patient not taking: No sig reported) 90 capsule 5  . oxyCODONE (ROXICODONE) 5 MG immediate release tablet One to Two Tabs By Mouth Every Six Hours As Needed For Pain (Patient not taking: No sig reported) 50 tablet 0  . vitamin C (ASCORBIC ACID) 500 MG tablet Take 500 mg by mouth 2 (two) times a week. (Patient not taking: Reported on 04/19/2020)     No current facility-administered medications for this visit.       REVIEW OF SYSTEMS (Negative unless checked)  Constitutional: [] Weight loss  [] Fever  [] Chills Cardiac: [] Chest pain   [] Chest pressure   [] Palpitations   [] Shortness of breath when laying flat   [] Shortness of breath at rest   [] Shortness of breath with exertion. Vascular:  [x] Pain in legs with walking   [] Pain in legs at rest   [] Pain in legs when laying flat   [x] Claudication   [] Pain in feet when walking  [] Pain in feet at rest  [] Pain in feet when laying flat   [] History of DVT   [] Phlebitis   [] Swelling in legs   [] Varicose veins   [] Non-healing ulcers Pulmonary:   [] Uses home oxygen   [] Productive cough   [] Hemoptysis   [] Wheeze  [] COPD   [] Asthma Neurologic:  [] Dizziness  [] Blackouts   [] Seizures   [] History of stroke   [] History of TIA  [] Aphasia   [] Temporary blindness   [] Dysphagia   [] Weakness or numbness in arms   [] Weakness or numbness in legs Musculoskeletal:  [] Arthritis   [] Joint swelling   [] Joint pain   [x] Low back pain Hematologic:  [] Easy bruising  [] Easy bleeding   [] Hypercoagulable state   [] Anemic  [] Hepatitis Gastrointestinal:  [] Blood in stool   [] Vomiting blood  [] Gastroesophageal reflux/heartburn   [] Abdominal pain Genitourinary:  [] Chronic kidney disease   [] Difficult urination  [] Frequent urination  [] Burning with urination   [] Hematuria Skin:  [] Rashes   [] Ulcers   [] Wounds Psychological:  [] History of anxiety   []  History of major depression.    Physical Exam BP 117/78   Pulse (!) 101   Ht 5\' 5"  (1.651 m)   Wt 164 lb (74.4 kg)   BMI 27.29 kg/m  Gen:  WD/WN, NAD Head: [place]/AT, No temporalis wasting.  Ear/Nose/Throat: Hearing grossly intact, nares w/o erythema or drainage, oropharynx w/o Erythema/Exudate Eyes: Conjunctiva clear, sclera non-icteric  Neck: trachea midline.  No JVD.  Pulmonary:  Good air movement, respirations not labored, no use of accessory muscles  Cardiac: RRR, no JVD Vascular:  Vessel Right Left  Radial Palpable Palpable                           DP 2+ 2+  PT 2+ 2+   Gastrointestinal:. No masses, surgical incisions, or scars. Musculoskeletal: M/S 5/5 throughout.  Extremities without ischemic changes.  No deformity or atrophy. No edema. Neurologic: Sensation grossly intact in extremities.  Symmetrical.  Speech is fluent. Motor exam as listed above. Psychiatric: Judgment intact, Mood & affect appropriate for pt's clinical situation. Dermatologic: No rashes or ulcers noted.  No cellulitis or open wounds.    Radiology No results found.  Labs No results found for this or any previous visit (from the past 2160 hour(s)).  Assessment/Plan:  PAD (peripheral artery disease) (HCC) To evaluate his perfusion noninvasive studies were performed today.  These demonstrate a right ABI of 1.08 and a left ABI of 1.05 with brisk triphasic waveforms and normal digital pressures bilaterally consistent with no arterial insufficiency.  It appears as if his perfusion is currently normal and would not be the cause of his lower extremity symptoms at this time.  Neurogenic claudication would be suspected at this point.  Referral to neurology is appropriate and he may need repeat imaging of his lumbosacral spine. We can recheck his perfusion in one year.  Leg pain To evaluate his perfusion noninvasive studies were performed today.  These demonstrate a right ABI of 1.08 and a left ABI of 1.05 with brisk triphasic waveforms and normal digital pressures bilaterally consistent with no arterial insufficiency.  It appears as if his perfusion is currently normal and would not be the cause of his lower extremity symptoms at this time.  Neurogenic claudication would be suspected at this point.  Referral to neurology is appropriate and he may need repeat imaging of his lumbosacral spine. We can recheck his perfusion in one year.  Hyperlipidemia lipid control important in reducing the progression of atherosclerotic disease. Continue statin  therapy       Festus Barren 04/19/2020, 1:36 PM   This note was created with Dragon medical transcription system.  Any errors from dictation are unintentional.

## 2020-04-19 NOTE — Assessment & Plan Note (Signed)
lipid control important in reducing the progression of atherosclerotic disease. Continue statin therapy  

## 2020-04-19 NOTE — Assessment & Plan Note (Signed)
To evaluate his perfusion noninvasive studies were performed today.  These demonstrate a right ABI of 1.08 and a left ABI of 1.05 with brisk triphasic waveforms and normal digital pressures bilaterally consistent with no arterial insufficiency.  It appears as if his perfusion is currently normal and would not be the cause of his lower extremity symptoms at this time.  Neurogenic claudication would be suspected at this point.  Referral to neurology is appropriate and he may need repeat imaging of his lumbosacral spine. We can recheck his perfusion in one year.

## 2020-04-19 NOTE — Assessment & Plan Note (Signed)
To evaluate his perfusion noninvasive studies were performed today.  These demonstrate a right ABI of 1.08 and a left ABI of 1.05 with brisk triphasic waveforms and normal digital pressures bilaterally consistent with no arterial insufficiency.  It appears as if his perfusion is currently normal and would not be the cause of his lower extremity symptoms at this time.  Neurogenic claudication would be suspected at this point.  Referral to neurology is appropriate and he may need repeat imaging of his lumbosacral spine. We can recheck his perfusion in one year. 

## 2020-05-02 ENCOUNTER — Encounter (INDEPENDENT_AMBULATORY_CARE_PROVIDER_SITE_OTHER): Payer: Self-pay | Admitting: Vascular Surgery

## 2020-05-02 ENCOUNTER — Encounter (INDEPENDENT_AMBULATORY_CARE_PROVIDER_SITE_OTHER): Payer: Self-pay

## 2020-05-09 DIAGNOSIS — G5603 Carpal tunnel syndrome, bilateral upper limbs: Secondary | ICD-10-CM | POA: Insufficient documentation

## 2021-04-18 ENCOUNTER — Ambulatory Visit (INDEPENDENT_AMBULATORY_CARE_PROVIDER_SITE_OTHER): Payer: 59 | Admitting: Vascular Surgery

## 2021-04-18 ENCOUNTER — Encounter (INDEPENDENT_AMBULATORY_CARE_PROVIDER_SITE_OTHER): Payer: 59

## 2021-06-13 ENCOUNTER — Other Ambulatory Visit: Payer: Self-pay | Admitting: Internal Medicine

## 2021-06-13 ENCOUNTER — Ambulatory Visit
Admission: RE | Admit: 2021-06-13 | Discharge: 2021-06-13 | Disposition: A | Discharge status: Home / Self Care | Payer: 59 | Source: Ambulatory Visit | Attending: Internal Medicine | Admitting: Internal Medicine

## 2021-06-13 ENCOUNTER — Ambulatory Visit
Admission: RE | Admit: 2021-06-13 | Discharge: 2021-06-13 | Disposition: A | Discharge status: Home / Self Care | Payer: 59 | Attending: Internal Medicine | Admitting: Internal Medicine

## 2021-06-13 DIAGNOSIS — M542 Cervicalgia: Secondary | ICD-10-CM

## 2021-06-13 DIAGNOSIS — M25522 Pain in left elbow: Secondary | ICD-10-CM

## 2021-06-19 ENCOUNTER — Other Ambulatory Visit: Payer: Self-pay

## 2021-06-19 DIAGNOSIS — R1909 Other intra-abdominal and pelvic swelling, mass and lump: Secondary | ICD-10-CM

## 2021-06-20 ENCOUNTER — Other Ambulatory Visit
Admission: RE | Admit: 2021-06-20 | Discharge: 2021-06-20 | Disposition: A | Discharge status: Home / Self Care | Payer: 59 | Attending: Urology | Admitting: Urology

## 2021-06-20 ENCOUNTER — Ambulatory Visit (INDEPENDENT_AMBULATORY_CARE_PROVIDER_SITE_OTHER): Payer: 59 | Admitting: Urology

## 2021-06-20 ENCOUNTER — Encounter: Payer: Self-pay | Admitting: Urology

## 2021-06-20 VITALS — BP 102/65 | HR 84 | Ht 65.0 in | Wt 158.0 lb

## 2021-06-20 DIAGNOSIS — R1031 Right lower quadrant pain: Secondary | ICD-10-CM

## 2021-06-20 DIAGNOSIS — R222 Localized swelling, mass and lump, trunk: Secondary | ICD-10-CM | POA: Diagnosis not present

## 2021-06-20 DIAGNOSIS — R1909 Other intra-abdominal and pelvic swelling, mass and lump: Secondary | ICD-10-CM | POA: Insufficient documentation

## 2021-06-20 DIAGNOSIS — K4090 Unilateral inguinal hernia, without obstruction or gangrene, not specified as recurrent: Secondary | ICD-10-CM

## 2021-06-20 LAB — URINALYSIS, COMPLETE (UACMP) WITH MICROSCOPIC
Glucose, UA: NEGATIVE mg/dL
Ketones, ur: NEGATIVE mg/dL
Leukocytes,Ua: NEGATIVE
Nitrite: NEGATIVE
Protein, ur: 30 mg/dL — AB
Specific Gravity, Urine: >1.03 — ABNORMAL HIGH (ref 1.005–1.030)
pH: 6 (ref 5.0–8.0)

## 2021-06-20 NOTE — Progress Notes (Signed)
? ?  06/20/21 ?10:44 AM  ? ?[name] ?1965-05-18 ?[id number] ? ?CC: Right lower abdominal pain, groin lump ? ?HPI: ?56 year old male referred for the above issues.  He reports a palpable painful lump in the right lower abdomen that seems to be worse with physical activity.  No imaging has been performed.  He denies any urinary symptoms, urinalysis today is completely benign.  He has a low midline scar from some type of vascular procedure. ? ? ?PMH: ?Past Medical History:  ?Diagnosis Date  ? Leg pain   ? Low back pain 04/04/2017  ? Patient denies medical problems   ? ? ?Surgical History: ?Past Surgical History:  ?Procedure Laterality Date  ? AORTA - BILATERAL FEMORAL ARTERY BYPASS GRAFT N/A 05/22/2017  ? Procedure: AORTA BIFEMORAL BYPASS GRAFT, Aortic bilateral, and internal ischemia;  Surgeon: Katha Cabal, MD;  Location: [facility] ORS;  Service: Vascular;  Laterality: N/A;  ? CENTRAL VENOUS CATHETER INSERTION Left 05/22/2017  ? Procedure: INSERTION CENTRAL LINE ADULT Left Internal Jugular;  Surgeon: Katha Cabal, MD;  Location: [facility] ORS;  Service: Vascular;  Laterality: Left;  ? COLONOSCOPY WITH PROPOFOL N/A 03/27/2017  ? Procedure: COLONOSCOPY WITH PROPOFOL;  Surgeon: Robert Bellow, MD;  Location: [facility] END[facility];  Service: Endoscopy;  Laterality: N/A;  ? KIDNEY STONE SURGERY    ? KNEE SURGERY  2006  ? ? ? ?Family History: ?Family History  ?Problem Relation Age of Onset  ? Diabetes Mother   ? Heart disease Mother   ? Alzheimer's disease Father   ? ? ?Social History:  reports that he quit smoking about 4 years ago. His smoking use included cigarettes. He has a 7.50 pack-year smoking history. He has been exposed to tobacco smoke. He has never used smokeless tobacco. He reports that he does not drink alcohol and does not use drugs. ? ?Physical Exam: ?BP 102/65   Pulse 84   Ht 5\' 5"  (1.651 m)   Wt 158 lb (71.7 kg)   BMI 26.29 kg/m?   ? ?Constitutional:  Alert and oriented, No acute  distress. ?Cardiovascular: No clubbing, cyanosis, or edema. ?Respiratory: Normal respiratory effort, no increased work of breathing. ?GI: Abdomen is soft, nontender, nondistended, subtle bulge in the right lower abdomen that is tender, possible direct hernia ?GU: Phallus with patent meatus, no lesions, testicles 20 cc and descended bilaterally, no testicular tenderness ? ?Assessment & Plan:   ?56 year old male referred to urology for groin lump and abdominal pain that appears most consistent with a hernia.  He has no testicular tenderness or scrotal abnormalities on exam, and urinalysis is benign.  Referral placed to general surgery ? ?[name], MD ?06/20/2021 ? ?[place] ?[address] ?[place], [place] [address] ?[phone] ? ?

## 2021-06-20 NOTE — Patient Instructions (Signed)
Hernia, Adult     A hernia is the bulging of an organ or tissue through a weak spot in the muscles of the abdomen. Hernias develop most often near the belly button (navel) or the area where the leg meets the lower abdomen (groin). Common types of hernias include: Incisional hernia. This type bulges through a scar from an abdominal surgery. Umbilical hernia. This type develops near the navel. Inguinal hernia. This type develops in the groin or scrotum. Femoral hernia. This type develops below the groin, in the upper thigh area. Hiatal hernia. This type occurs when part of the stomach slides above the muscle that separates the abdomen from the chest (diaphragm). What are the causes? This condition may be caused by: Heavy lifting. Coughing over a long period of time. Straining to have a bowel movement. Constipation can lead to straining. An incision made during abdominal surgery. A physical problem that is present at birth (congenital defect). Being overweight or obese. Smoking. Excess fluid in the abdomen. Undescended testicles in males. What are the signs or symptoms? The main symptom is a skin-colored, rounded bulge in the area of the hernia. However, a bulge may not always be present. It may grow bigger or be more visible when you cough or strain (such as when lifting something heavy). A hernia that can be pushed back into the abdomen (is reducible) rarely causes pain. A hernia that cannot be pushed back into the abdomen (is incarcerated) may lose its blood supply (become strangulated). A hernia that is incarcerated may cause: Pain. Fever. Nausea and vomiting. Swelling. Constipation. How is this diagnosed? A hernia may be diagnosed based on: Your symptoms and medical history. A physical exam. Your health care provider may ask you to cough or move in certain ways to see if the hernia becomes visible. Imaging tests, such as: X-rays. Ultrasound. CT scan. How is this treated? A  hernia that is small and painless may not need to be treated. A hernia that is large or painful may be treated with surgery. Inguinal hernias may be treated with surgery to prevent incarceration or strangulation. Strangulated hernias are always treated with surgery because the strangulation causes a lack of blood supply to the trapped organ or tissue. Surgery to treat a hernia involves pushing the bulge back into place and repairing the weak area of the muscle or abdominal wall. Follow these instructions at home: Activity Avoid straining. Do not lift anything that is heavier than 10 lb (4.5 kg), or the limit that you are told, until your health care provider says that it is safe. When lifting heavy objects, lift with your leg muscles, not your back muscles. Preventing constipation Take actions to prevent constipation. Constipation leads to straining with bowel movements, which can make a hernia worse or cause a hernia repair to break down. Your health care provider may recommend that you take these actions to prevent or treat constipation: Drink enough fluid to keep your urine pale yellow. Take over-the-counter or prescription medicines. Eat foods that are high in fiber, such as beans, whole grains, and fresh fruits and vegetables. Limit foods that are high in fat and processed sugars, such as fried or sweet foods. General instructions When coughing, try to cough gently. You may try to push the hernia back in place by very gently pressing on it while lying down. Do not try to force the bulge back in if it will not push in easily. If you are overweight, work with your health care provider   to lose weight safely. Do not use any products that contain nicotine or tobacco. These products include cigarettes, chewing tobacco, and vaping devices, such as e-cigarettes. If you need help quitting, ask your health care provider. If you are scheduled for hernia repair, watch your hernia for any changes in shape,  size, or color. Tell your health care provider about any changes or new symptoms. Take over-the-counter and prescription medicines only as told by your health care provider. Keep all follow-up visits. This is important. Contact a health care provider if: You develop new pain, swelling, or redness around your hernia. You have signs of constipation, such as: Fewer bowel movements in a week than normal. Difficulty having a bowel movement. Stools that are dry, hard, or larger than normal. Get help right away if: You have a fever or chills. You have abdominal pain that gets worse. You feel nauseous or you vomit. You cannot push the hernia back in place by very gently pressing on it while lying down. Do not try to force the bulge back in if it will not go in easily. The hernia: Changes in shape, size, or color. Feels hard or tender. These symptoms may represent a serious problem that is an emergency. Do not wait to see if the symptoms will go away. Get medical help right away. Call your local emergency services (911 in the [place] Do not drive yourself to the hospital. Summary A hernia is the bulging of an organ or tissue through a weak spot in the muscles of the abdomen. The main symptom is a skin-colored bulge in the hernia area. However, a bulge may not always be present. It may grow bigger or more visible when you cough or strain (such as when having a bowel movement). A hernia that is small and painless may not need to be treated. A hernia that is large or painful may be treated with surgery. Surgery to treat a hernia involves pushing the bulge back into place and repairing the weak part of the abdomen. This information is not intended to replace advice given to you by your health care provider. Make sure you discuss any questions you have with your health care provider. Document Revised: 09/28/2019 Document Reviewed: 09/28/2019 Elsevier Patient Education  2023 [employer]  

## 2021-06-22 ENCOUNTER — Telehealth: Payer: Self-pay

## 2021-06-22 ENCOUNTER — Telehealth: Payer: Self-pay | Admitting: Surgery

## 2021-06-22 ENCOUNTER — Ambulatory Visit: Payer: Self-pay | Admitting: Surgery

## 2021-06-22 ENCOUNTER — Other Ambulatory Visit: Payer: Self-pay

## 2021-06-22 ENCOUNTER — Ambulatory Visit (INDEPENDENT_AMBULATORY_CARE_PROVIDER_SITE_OTHER): Payer: 59 | Admitting: Surgery

## 2021-06-22 ENCOUNTER — Encounter: Payer: Self-pay | Admitting: Surgery

## 2021-06-22 VITALS — BP 145/90 | HR 96 | Temp 98.0°F | Ht 65.0 in | Wt 157.8 lb

## 2021-06-22 DIAGNOSIS — K4090 Unilateral inguinal hernia, without obstruction or gangrene, not specified as recurrent: Secondary | ICD-10-CM | POA: Diagnosis not present

## 2021-06-22 NOTE — Telephone Encounter (Signed)
Left message for patient to call.  Please inform him of the following regarding scheduled surgery:   ? ?Pre-Admission date/time, COVID Testing date and Surgery date. ? ?Surgery Date: 06/28/21 ?Preadmission Testing Date: 06/23/21 (phone 1p-5p) ?Covid Testing Date: Not needed.   ? ?Also patient will need to call at [phone], between 1-3:00pm the day before surgery, to find out what time to arrive for surgery.   ? ?

## 2021-06-22 NOTE — Telephone Encounter (Signed)
Surgical Clearance faxed to Dr.Fayegh Dario Guardian -patient recently prescribed Plavix but has not started taking as of yet.  ?

## 2021-06-22 NOTE — Patient Instructions (Addendum)
Our surgery scheduler will call you within 24-48 hours to schedule your surgery. Please have the Blue surgery sheet available when speaking with her.  ? ?Anticoagulant Clearance faxed to Dr.Fayegh Jadali.  ? ? ?Inguinal Hernia, Adult ?An inguinal hernia develops when fat or the intestines push through a weak spot in a muscle where the leg meets the lower abdomen (groin). This creates a bulge. This kind of hernia could also be: ?In the scrotum, if you are male. ?In folds of skin around the vagina, if you are male. ?There are three types of inguinal hernias: ?Hernias that can be pushed back into the abdomen (are reducible). This type rarely causes pain. ?Hernias that are not reducible (are incarcerated). ?Hernias that are not reducible and lose their blood supply (are strangulated). This type of hernia requires emergency surgery. ?What are the causes? ?This condition is caused by having a weak spot in the muscles or tissues in your groin. This develops over time. The hernia may poke through the weak spot when you suddenly strain your lower abdominal muscles, such as when you: ?Lift a heavy object. ?Strain to have a bowel movement. Constipation can lead to straining. ?Cough. ?What increases the risk? ?This condition is more likely to develop in: ?Males. ?Pregnant females. ?People who: ?Are overweight. ?Work in jobs that require long periods of standing or heavy lifting. ?Have had an inguinal hernia before. ?Smoke or have lung disease. These factors can lead to long-term (chronic) coughing. ?What are the signs or symptoms? ?Symptoms may depend on the size of the hernia. Often, a small inguinal hernia has no symptoms. Symptoms of a larger hernia may include: ?A bulge in the groin area. This is easier to see when standing. It might not be visible when lying down. ?Pain or burning in the groin. This may get worse when lifting, straining, or coughing. ?A dull ache or a feeling of pressure in the groin. ?An unusual bulge  in the scrotum, in males. ?Symptoms of a strangulated inguinal hernia may include: ?A bulge in your groin that is very painful and tender to the touch. ?A bulge that turns red or purple. ?Fever, nausea, and vomiting. ?Inability to have a bowel movement or to pass gas. ?How is this diagnosed? ?This condition is diagnosed based on your symptoms, your medical history, and a physical exam. Your health care provider may feel your groin area and ask you to cough. ?How is this treated? ?Treatment depends on the size of your hernia and whether you have symptoms. If you do not have symptoms, your health care provider may have you watch your hernia carefully and have you come in for follow-up visits. If your hernia is large or if you have symptoms, you may need surgery to repair the hernia. ?Follow these instructions at home: ?Lifestyle ?Avoid lifting heavy objects. ?Avoid standing for long periods of time. ?Do not use any products that contain nicotine or tobacco. These products include cigarettes, chewing tobacco, and vaping devices, such as e-cigarettes. If you need help quitting, ask your health care provider. ?Maintain a healthy weight. ?Preventing constipation ?You may need to take these actions to prevent or treat constipation: ?Drink enough fluid to keep your urine pale yellow. ?Take over-the-counter or prescription medicines. ?Eat foods that are high in fiber, such as beans, whole grains, and fresh fruits and vegetables. ?Limit foods that are high in fat and processed sugars, such as fried or sweet foods. ?General instructions ?You may try to push the hernia back in place  by very gently pressing on it while lying down. Do not try to force the bulge back in if it will not push in easily. ?Watch your hernia for any changes in shape, size, or color. Get help right away if you notice any changes. ?Take over-the-counter and prescription medicines only as told by your health care provider. ?Keep all follow-up visits. This  is important. ?Contact a health care provider if: ?You have a fever or chills. ?You develop new symptoms. ?Your symptoms get worse. ?Get help right away if: ?You have pain in your groin that suddenly gets worse. ?You have a bulge in your groin that: ?Suddenly gets bigger and does not get smaller. ?Becomes red or purple or painful to the touch. ?You are a man and you have a sudden pain in your scrotum, or the size of your scrotum suddenly changes. ?You cannot push the hernia back in place by very gently pressing on it when you are lying down. ?You have nausea or vomiting that does not go away. ?You have a fast heartbeat. ?You cannot have a bowel movement or pass gas. ?These symptoms may represent a serious problem that is an emergency. Do not wait to see if the symptoms will go away. Get medical help right away. Call your local emergency services (911 in the [place] ?Summary ?An inguinal hernia develops when fat or the intestines push through a weak spot in a muscle where your leg meets your lower abdomen (groin). ?This condition is caused by having a weak spot in muscles or tissues in your groin. ?Symptoms may depend on the size of the hernia, and they may include pain or swelling in your groin. A small inguinal hernia often has no symptoms. ?Treatment may not be needed if you do not have symptoms. If you have symptoms or a large hernia, you may need surgery to repair the hernia. ?Avoid lifting heavy objects. Also, avoid standing for long periods of time. ?This information is not intended to replace advice given to you by your health care provider. Make sure you discuss any questions you have with your health care provider. ?Document Revised: 10/20/2019 Document Reviewed: 10/20/2019 ?Elsevier Patient Education ? 2023 [employer] ? ?

## 2021-06-22 NOTE — H&P (View-Only) (Signed)
Patient ID: [name], male   DOB: 02/12/1966, 55 y.o.   MRN: [id number] ? ?Chief Complaint: Right inguinal hernia ? ?History of Present Illness ?[name] is a 55 y.o. male with a 2-week history of a bulge present in the right groin.  He has associated discomfort with burning when he is upright and walking about.  Pain is exacerbated by coughing, sneezing or being upright.  When he lies supine the hernia spontaneously reduces.  He denies any issues with constipation, diarrhea or voiding issues.  He reports he must do heavy lifting for his work and is currently unable to work due to the pain.  Prior surgical history includes an aortobiiliac bypass for aortic occlusion.  He is about 4 years out from that. ? ?Past Medical History ?Past Medical History:  ?Diagnosis Date  ? Leg pain   ? Low back pain 04/04/2017  ? Patient denies medical problems   ?  ? ? ?Past Surgical History:  ?Procedure Laterality Date  ? AORTA - BILATERAL FEMORAL ARTERY BYPASS GRAFT N/A 05/22/2017  ? Procedure: AORTA BIFEMORAL BYPASS GRAFT, Aortic bilateral, and internal ischemia;  Surgeon: Schnier, Gregory G, MD;  Location: [facility] ORS;  Service: Vascular;  Laterality: N/A;  ? CENTRAL VENOUS CATHETER INSERTION Left 05/22/2017  ? Procedure: INSERTION CENTRAL LINE ADULT Left Internal Jugular;  Surgeon: Schnier, Gregory G, MD;  Location: [facility] ORS;  Service: Vascular;  Laterality: Left;  ? COLONOSCOPY WITH PROPOFOL N/A 03/27/2017  ? Procedure: COLONOSCOPY WITH PROPOFOL;  Surgeon: Byrnett, Jeffrey W, MD;  Location: [facility] ENDOSCOPY;  Service: Endoscopy;  Laterality: N/A;  ? KIDNEY STONE SURGERY    ? KNEE SURGERY  2006  ? ? ?No Known Allergies ? ?Current Outpatient Medications  ?Medication Sig Dispense Refill  ? acetaminophen (TYLENOL) 325 MG tablet Take 650 mg by mouth daily as needed for moderate pain.    ? aspirin EC 81 MG EC tablet Take 1 tablet (81 mg total) by mouth daily.    ? atorvastatin (LIPITOR) 10 MG tablet   1  ? Cholecalciferol  (VITAMIN D) 2000 units tablet Take 2,000 Units by mouth 2 (two) times a week.    ? clopidogrel (PLAVIX) 75 MG tablet Take 1 tablet (75 mg total) by mouth daily. 30 tablet 5  ? docusate sodium (COLACE) 100 MG capsule Take 1 capsule (100 mg total) by mouth 3 (three) times daily. 90 capsule 5  ? vitamin C (ASCORBIC ACID) 500 MG tablet Take 500 mg by mouth 2 (two) times a week.    ? ?No current facility-administered medications for this visit.  ? ? ?Family History ?Family History  ?Problem Relation Age of Onset  ? Diabetes Mother   ? Heart disease Mother   ? Alzheimer's disease Father   ?  ? ? ?Social History ?Social History  ? ?Tobacco Use  ? Smoking status: Former  ?  Packs/day: 0.25  ?  Years: 30.00  ?  Pack years: 7.50  ?  Types: Cigarettes  ?  Quit date: 03/31/2017  ?  Years since quitting: 4.2  ?  Passive exposure: Past  ? Smokeless tobacco: Never  ?Vaping Use  ? Vaping Use: Some days  ?Substance Use Topics  ? Alcohol use: No  ? Drug use: No  ?  ?  ? ? ?Review of Systems  ?Constitutional: Negative.   ?HENT: Negative.    ?Eyes: Negative.   ?Respiratory: Negative.    ?Cardiovascular: Negative.   ?Gastrointestinal: Negative.   ?Genitourinary: Negative.   ?Skin:   Negative.   ?Neurological: Negative.   ?Psychiatric/Behavioral: Negative.     ? ?Physical Exam ?Blood pressure (!) 145/90, pulse 96, temperature 98 ?F (36.7 ?C), temperature source Oral, height 5' 5" (1.651 m), weight 157 lb 12.8 oz (71.6 kg), SpO2 99 %. ?Last Weight  Most recent update: 06/22/2021 10:54 AM  ? ? Weight  ?71.6 kg (157 lb 12.8 oz)  ?      ? ?  ? ? ?CONSTITUTIONAL: Well developed, and nourished, appropriately responsive and aware without distress.   ?EYES: Sclera non-icteric.   ?EARS, NOSE, MOUTH AND THROAT:  The oropharynx is clear. Oral mucosa is pink and moist.   Hearing is intact to voice.  ?NECK: Trachea is midline, and there is no jugular venous distension.  ?LYMPH NODES:  Lymph nodes in the neck are not enlarged. ?RESPIRATORY:  Lungs are  clear, and breath sounds are equal bilaterally. Normal respiratory effort without pathologic use of accessory muscles. ?CARDIOVASCULAR: Heart is regular in rate and rhythm. ?GI: The abdomen is remarkable for a long midline scar, soft, nontender, and nondistended.  There are no appreciable fascial defects or masses along the scar, there were no other palpable masses. I did not appreciate hepatosplenomegaly. There were normal bowel sounds. ?GU: There is an obvious right inguinal hernia that is readily reducible, and nonscrotal.  I do not appreciate any hernia on the left.  I do not appreciate any groin scarring or evidence of prior incisions from his vascular surgery. ?MUSCULOSKELETAL:  Symmetrical muscle tone appreciated in all four extremities.    ?SKIN: Skin turgor is normal. No pathologic skin lesions appreciated.  ?NEUROLOGIC:  Motor and sensation appear grossly normal.  Cranial nerves are grossly without defect. ?PSYCH:  Alert and oriented to person, place and time. Affect is appropriate for situation. ? ?Data Reviewed ?I have personally reviewed what is currently available of the patient's imaging, recent labs and medical records.   ?Labs:  ? ?  Latest Ref Rng & Units 05/29/2017  ?  6:53 AM 05/27/2017  ?  4:54 AM 05/25/2017  ?  4:14 AM  ?CBC  ?WBC 3.8 - 10.6 K/uL 5.2   3.5   5.5    ?Hemoglobin 13.0 - 18.0 g/dL 12.2   10.4   11.8    ?Hematocrit 40.0 - 52.0 % 35.4   29.3   34.9    ?Platelets 150 - 440 K/uL 297   154   140    ? ? ?  Latest Ref Rng & Units 05/30/2017  ?  4:40 AM 05/29/2017  ?  6:53 AM 05/28/2017  ?  4:48 AM  ?CMP  ?Potassium 3.5 - 5.1 mmol/L 3.9   3.7   3.3    ? ? ? ? ?Imaging: ?Radiology review: Took a look at the images from 2019 prior to his aorto bi iliac bypass. ?Within last 24 hrs: No results found. ? ?Assessment ?   ?Right inguinal hernia ?Patient Active Problem List  ? Diagnosis Date Noted  ? Bilateral carpal tunnel syndrome 05/09/2020  ? Hyperlipidemia 12/13/2017  ? Ejaculatory disorder  12/13/2017  ? PAD (peripheral artery disease) (HCC) 04/30/2017  ? Chronic distal aortic occlusion (HCC) 04/30/2017  ? Leg pain 04/30/2017  ? Low back pain 04/04/2017  ? Rectal pain 03/15/2017  ? Encounter for screening colonoscopy 03/15/2017  ? ? ?Plan ?   ?Robotic assisted laparoscopic lysis of adhesions with right inguinal hernia repair with mesh. ?I discussed with him that his scarring may prohibit proceeding with this   approach, however I still believe it is worthwhile to try this approach rather than a standard external approach to inguinal hernia repair. ?I discussed possibility of incarceration, strangulation, enlargement in size over time, and the need for emergency surgery in the face of these.  Also reviewed the techniques of reduction should incarceration occur, and when unsuccessful to present to the ED.  Also discussed that surgery risks include recurrence which can be up to 30% in the case of complex hernias, use of prosthetic materials (mesh) and the increased risk of infection and the possible need for re-operation and removal of mesh, possibility of post-op SBO or ileus, and the risks of general anesthetic including heart attack, stroke, sudden death or some reaction to anesthetic medications. The patient, and those present, appear to understand the risks, any and all questions were answered to the patient's satisfaction.  No guarantees were ever expressed or implied.  ? ?Face-to-face time spent with the patient and accompanying care providers(if present) was 30 minutes, with more than 50% of the time spent counseling, educating, and coordinating care of the patient.   ? ?These notes generated with voice recognition software. I apologize for typographical errors. ? ?Sacha Radloff M.D., FACS ?06/22/2021, 1:56 PM ? ? ? ? ?

## 2021-06-22 NOTE — Progress Notes (Signed)
Patient ID: [name], male   DOB: 1965-08-13, 56 y.o.   MRN: [id number] ? ?Chief Complaint: Right inguinal hernia ? ?History of Present Illness ?[name] is a 56 y.o. male with a 2-week history of a bulge present in the right groin.  He has associated discomfort with burning when he is upright and walking about.  Pain is exacerbated by coughing, sneezing or being upright.  When he lies supine the hernia spontaneously reduces.  He denies any issues with constipation, diarrhea or voiding issues.  He reports he must do heavy lifting for his work and is currently unable to work due to the pain.  Prior surgical history includes an aortobiiliac bypass for aortic occlusion.  He is about 4 years out from that. ? ?Past Medical History ?Past Medical History:  ?Diagnosis Date  ? Leg pain   ? Low back pain 04/04/2017  ? Patient denies medical problems   ?  ? ? ?Past Surgical History:  ?Procedure Laterality Date  ? AORTA - BILATERAL FEMORAL ARTERY BYPASS GRAFT N/A 05/22/2017  ? Procedure: AORTA BIFEMORAL BYPASS GRAFT, Aortic bilateral, and internal ischemia;  Surgeon: Katha Cabal, MD;  Location: [facility] ORS;  Service: Vascular;  Laterality: N/A;  ? CENTRAL VENOUS CATHETER INSERTION Left 05/22/2017  ? Procedure: INSERTION CENTRAL LINE ADULT Left Internal Jugular;  Surgeon: Katha Cabal, MD;  Location: [facility] ORS;  Service: Vascular;  Laterality: Left;  ? COLONOSCOPY WITH PROPOFOL N/A 03/27/2017  ? Procedure: COLONOSCOPY WITH PROPOFOL;  Surgeon: Robert Bellow, MD;  Location: [facility] ENDOSCOPY;  Service: Endoscopy;  Laterality: N/A;  ? KIDNEY STONE SURGERY    ? KNEE SURGERY  2006  ? ? ?No Known Allergies ? ?Current Outpatient Medications  ?Medication Sig Dispense Refill  ? acetaminophen (TYLENOL) 325 MG tablet Take 650 mg by mouth daily as needed for moderate pain.    ? aspirin EC 81 MG EC tablet Take 1 tablet (81 mg total) by mouth daily.    ? atorvastatin (LIPITOR) 10 MG tablet   1  ? Cholecalciferol  (VITAMIN D) 2000 units tablet Take 2,000 Units by mouth 2 (two) times a week.    ? clopidogrel (PLAVIX) 75 MG tablet Take 1 tablet (75 mg total) by mouth daily. 30 tablet 5  ? docusate sodium (COLACE) 100 MG capsule Take 1 capsule (100 mg total) by mouth 3 (three) times daily. 90 capsule 5  ? vitamin C (ASCORBIC ACID) 500 MG tablet Take 500 mg by mouth 2 (two) times a week.    ? ?No current facility-administered medications for this visit.  ? ? ?Family History ?Family History  ?Problem Relation Age of Onset  ? Diabetes Mother   ? Heart disease Mother   ? Alzheimer's disease Father   ?  ? ? ?Social History ?Social History  ? ?Tobacco Use  ? Smoking status: Former  ?  Packs/day: 0.25  ?  Years: 30.00  ?  Pack years: 7.50  ?  Types: Cigarettes  ?  Quit date: 03/31/2017  ?  Years since quitting: 4.2  ?  Passive exposure: Past  ? Smokeless tobacco: Never  ?Vaping Use  ? Vaping Use: Some days  ?Substance Use Topics  ? Alcohol use: No  ? Drug use: No  ?  ?  ? ? ?Review of Systems  ?Constitutional: Negative.   ?HENT: Negative.    ?Eyes: Negative.   ?Respiratory: Negative.    ?Cardiovascular: Negative.   ?Gastrointestinal: Negative.   ?Genitourinary: Negative.   ?Skin:  Negative.   ?Neurological: Negative.   ?Psychiatric/Behavioral: Negative.     ? ?Physical Exam ?Blood pressure (!) 145/90, pulse 96, temperature 98 ?F (36.7 ?C), temperature source Oral, height 5\' 5"  (1.651 m), weight 157 lb 12.8 oz (71.6 kg), SpO2 99 %. ?Last Weight  Most recent update: 06/22/2021 10:54 AM  ? ? Weight  ?71.6 kg (157 lb 12.8 oz)  ?      ? ?  ? ? ?CONSTITUTIONAL: Well developed, and nourished, appropriately responsive and aware without distress.   ?EYES: Sclera non-icteric.   ?EARS, NOSE, MOUTH AND THROAT:  The oropharynx is clear. Oral mucosa is pink and moist.   Hearing is intact to voice.  ?NECK: Trachea is midline, and there is no jugular venous distension.  ?LYMPH NODES:  Lymph nodes in the neck are not enlarged. ?RESPIRATORY:  Lungs are  clear, and breath sounds are equal bilaterally. Normal respiratory effort without pathologic use of accessory muscles. ?CARDIOVASCULAR: Heart is regular in rate and rhythm. ?GI: The abdomen is remarkable for a long midline scar, soft, nontender, and nondistended.  There are no appreciable fascial defects or masses along the scar, there were no other palpable masses. I did not appreciate hepatosplenomegaly. There were normal bowel sounds. ?GU: There is an obvious right inguinal hernia that is readily reducible, and nonscrotal.  I do not appreciate any hernia on the left.  I do not appreciate any groin scarring or evidence of prior incisions from his vascular surgery. ?MUSCULOSKELETAL:  Symmetrical muscle tone appreciated in all four extremities.    ?SKIN: Skin turgor is normal. No pathologic skin lesions appreciated.  ?NEUROLOGIC:  Motor and sensation appear grossly normal.  Cranial nerves are grossly without defect. ?PSYCH:  Alert and oriented to person, place and time. Affect is appropriate for situation. ? ?Data Reviewed ?I have personally reviewed what is currently available of the patient's imaging, recent labs and medical records.   ?Labs:  ? ?  Latest Ref Rng & Units 05/29/2017  ?  6:53 AM 05/27/2017  ?  4:54 AM 05/25/2017  ?  4:14 AM  ?CBC  ?WBC 3.8 - 10.6 K/uL 5.2   3.5   5.5    ?Hemoglobin 13.0 - 18.0 g/dL 12.2   10.4   11.8    ?Hematocrit 40.0 - 52.0 % 35.4   29.3   34.9    ?Platelets 150 - 440 K/uL 297   154   140    ? ? ?  Latest Ref Rng & Units 05/30/2017  ?  4:40 AM 05/29/2017  ?  6:53 AM 05/28/2017  ?  4:48 AM  ?CMP  ?Potassium 3.5 - 5.1 mmol/L 3.9   3.7   3.3    ? ? ? ? ?Imaging: ?Radiology review: Took a look at the images from 2019 prior to his aorto bi iliac bypass. ?Within last 24 hrs: No results found. ? ?Assessment ?   ?Right inguinal hernia ?Patient Active Problem List  ? Diagnosis Date Noted  ? Bilateral carpal tunnel syndrome 05/09/2020  ? Hyperlipidemia 12/13/2017  ? Ejaculatory disorder  12/13/2017  ? PAD (peripheral artery disease) ([place]) 04/30/2017  ? Chronic distal aortic occlusion ([place]) 04/30/2017  ? Leg pain 04/30/2017  ? Low back pain 04/04/2017  ? Rectal pain 03/15/2017  ? Encounter for screening colonoscopy 03/15/2017  ? ? ?Plan ?   ?Robotic assisted laparoscopic lysis of adhesions with right inguinal hernia repair with mesh. ?I discussed with him that his scarring may prohibit proceeding with this  approach, however I still believe it is worthwhile to try this approach rather than a standard external approach to inguinal hernia repair. ?I discussed possibility of incarceration, strangulation, enlargement in size over time, and the need for emergency surgery in the face of these.  Also reviewed the techniques of reduction should incarceration occur, and when unsuccessful to present to the ED.  Also discussed that surgery risks include recurrence which can be up to 30% in the case of complex hernias, use of prosthetic materials (mesh) and the increased risk of infection and the possible need for re-operation and removal of mesh, possibility of post-op SBO or ileus, and the risks of general anesthetic including heart attack, stroke, sudden death or some reaction to anesthetic medications. The patient, and those present, appear to understand the risks, any and all questions were answered to the patient's satisfaction.  No guarantees were ever expressed or implied.  ? ?Face-to-face time spent with the patient and accompanying care providers(if present) was 30 minutes, with more than 50% of the time spent counseling, educating, and coordinating care of the patient.   ? ?These notes generated with voice recognition software. I apologize for typographical errors. ? ?Ronny Bacon M.D., FACS ?06/22/2021, 1:56 PM ? ? ? ? ?

## 2021-06-23 ENCOUNTER — Inpatient Hospital Stay
Admission: RE | Admit: 2021-06-23 | Discharge: 2021-06-23 | Disposition: A | Discharge status: Home / Self Care | Payer: 59 | Source: Ambulatory Visit

## 2021-06-23 HISTORY — DX: Peripheral vascular disease, unspecified: I73.9

## 2021-06-23 HISTORY — DX: Personal history of urinary calculi: Z87.442

## 2021-06-23 NOTE — Telephone Encounter (Signed)
Patient is called again.  He will be in around 1:00 today to drop off some FMLA forms and blue sheet for surgery information will also be given to him at that time.   ?

## 2021-06-23 NOTE — Pre-Procedure Instructions (Signed)
Attempted multiple times today to complete pre admission phone call with patient using an interpreter ID # [id number], [id number], [phone]. Will continue to attempt contact with patient , if unable today , will reschedule him for next week. ?

## 2021-06-23 NOTE — Patient Instructions (Addendum)
Your procedure is scheduled on: 06/28/21 - Wednesday ?Report to the Registration Desk on the 1st floor of the [facility]. ?To find out your arrival time, please call [phone] between 1PM - 3PM on: 06/27/21 - Tuesday ? ?REMEMBER: ?Instructions that are not followed completely may result in serious medical risk, up to and including death; or upon the discretion of your surgeon and anesthesiologist your surgery may need to be rescheduled. ? ?Do not eat food or drink any liquids after midnight the night before surgery.  ?No gum chewing, lozengers or hard candies. ? ? ?TAKE THESE MEDICATIONS THE MORNING OF SURGERY WITH A SIP OF WATER: NONE ? ?Follow recommendations from Cardiologist, Pulmonologist or PCP regarding stopping Aspirin, Coumadin, Plavix, Eliquis, Pradaxa, or Pletal. ? ?One week prior to surgery: ?Stop Anti-inflammatories (NSAIDS) such as Advil, Aleve, Ibuprofen, Motrin, Naproxen, Naprosyn and Aspirin based products such as Excedrin, Goodys Powder, BC Powder. ? ?Stop ANY OVER THE COUNTER supplements until after surgery. ? ?You may take Tylenol if needed for pain up until the day of surgery. ? ?No Alcohol for 24 hours before or after surgery. ? ?No Smoking including e-cigarettes for 24 hours prior to surgery.  ?No chewable tobacco products for at least 6 hours prior to surgery.  ?No nicotine patches on the day of surgery. ? ?Do not use any "recreational" drugs for at least a week prior to your surgery.  ?Please be advised that the combination of cocaine and anesthesia may have negative outcomes, up to and including death. ?If you test positive for cocaine, your surgery will be cancelled. ? ?On the morning of surgery brush your teeth with toothpaste and water, you may rinse your mouth with mouthwash if you wish. ?Do not swallow any toothpaste or mouthwash. ? ?Use CHG Soap or wipes as directed on instruction sheet. ? ?Do not wear jewelry, make-up, hairpins, clips or nail polish. ? ?Do not wear lotions,  powders, or perfumes.  ? ?Do not shave body from the neck down 48 hours prior to surgery just in case you cut yourself which could leave a site for infection.  ?Also, freshly shaved skin may become irritated if using the CHG soap. ? ?Contact lenses, hearing aids and dentures may not be worn into surgery. ? ?Do not bring valuables to the hospital. [facility] is not responsible for any missing/lost belongings or valuables.  ? ?Notify your doctor if there is any change in your medical condition (cold, fever, infection). ? ?Wear comfortable clothing (specific to your surgery type) to the hospital. ? ?After surgery, you can help prevent lung complications by doing breathing exercises.  ?Take deep breaths and cough every 1-2 hours. Your doctor may order a device called an Incentive Spirometer to help you take deep breaths. ?When coughing or sneezing, hold a pillow firmly against your incision with both hands. This is called ?splinting.? Doing this helps protect your incision. It also decreases belly discomfort. ? ?If you are being admitted to the hospital overnight, leave your suitcase in the car. ?After surgery it may be brought to your room. ? ?If you are being discharged the day of surgery, you will not be allowed to drive home. ?You will need a responsible adult (18 years or older) to drive you home and stay with you that night.  ? ?If you are taking public transportation, you will need to have a responsible adult (18 years or older) with you. ?Please confirm with your physician that it is acceptable to use public transportation.  ? ?  Please call the Pre-admissions Testing Dept. at [phone] if you have any questions about these instructions. ? ?Surgery Visitation Policy: ? ?Patients undergoing a surgery or procedure may have two family members or support persons with them as long as the person is not COVID-19 positive or experiencing its symptoms.  ? ?Inpatient Visitation:   ? ?Visiting hours are 7 a.m. to 8  p.m. ?Up to four visitors are allowed at one time in a patient room, including children. The visitors may rotate out with other people during the day. One designated support person (adult) may remain overnight.  ?

## 2021-06-27 ENCOUNTER — Encounter
Admission: RE | Admit: 2021-06-27 | Discharge: 2021-06-27 | Disposition: A | Discharge status: Home / Self Care | Payer: 59 | Source: Ambulatory Visit | Attending: Surgery | Admitting: Surgery

## 2021-06-27 ENCOUNTER — Other Ambulatory Visit: Payer: Self-pay

## 2021-06-27 DIAGNOSIS — K4090 Unilateral inguinal hernia, without obstruction or gangrene, not specified as recurrent: Secondary | ICD-10-CM | POA: Diagnosis not present

## 2021-06-27 DIAGNOSIS — Z01818 Encounter for other preprocedural examination: Secondary | ICD-10-CM | POA: Insufficient documentation

## 2021-06-27 LAB — CBC WITH DIFFERENTIAL/PLATELET
Abs Immature Granulocytes: 0.09 10*3/uL — ABNORMAL HIGH (ref 0.00–0.07)
Basophils Absolute: 0.1 10*3/uL (ref 0.0–0.1)
Basophils Relative: 1 %
Eosinophils Absolute: 0.1 10*3/uL (ref 0.0–0.5)
Eosinophils Relative: 1 %
HCT: 46.1 % (ref 39.0–52.0)
Hemoglobin: 15.4 g/dL (ref 13.0–17.0)
Immature Granulocytes: 1 %
Lymphocytes Relative: 32 %
Lymphs Abs: 2.9 10*3/uL (ref 0.7–4.0)
MCH: 28.5 pg (ref 26.0–34.0)
MCHC: 33.4 g/dL (ref 30.0–36.0)
MCV: 85.2 fL (ref 80.0–100.0)
Monocytes Absolute: 0.7 10*3/uL (ref 0.1–1.0)
Monocytes Relative: 8 %
Neutro Abs: 5.2 10*3/uL (ref 1.7–7.7)
Neutrophils Relative %: 57 %
Platelets: 241 10*3/uL (ref 150–400)
RBC: 5.41 MIL/uL (ref 4.22–5.81)
RDW: 13.6 % (ref 11.5–15.5)
WBC: 9 10*3/uL (ref 4.0–10.5)
nRBC: 0 % (ref 0.0–0.2)

## 2021-06-27 LAB — COMPREHENSIVE METABOLIC PANEL
ALT: 43 U/L (ref 0–44)
AST: 28 U/L (ref 15–41)
Albumin: 3.9 g/dL (ref 3.5–5.0)
Alkaline Phosphatase: 75 U/L (ref 38–126)
Anion gap: 5 (ref 5–15)
BUN: 15 mg/dL (ref 6–20)
CO2: 30 mmol/L (ref 22–32)
Calcium: 9.7 mg/dL (ref 8.9–10.3)
Chloride: 103 mmol/L (ref 98–111)
Creatinine, Ser: 0.77 mg/dL (ref 0.61–1.24)
GFR, Estimated: >60 mL/min (ref >60)
Glucose, Bld: 102 mg/dL — ABNORMAL HIGH (ref 70–99)
Potassium: 3.5 mmol/L (ref 3.5–5.1)
Sodium: 138 mmol/L (ref 135–145)
Total Bilirubin: 0.9 mg/dL (ref 0.3–1.2)
Total Protein: 7.1 g/dL (ref 6.5–8.1)

## 2021-06-27 MED ORDER — CELECOXIB 200 MG PO CAPS: 200.0000 mg | ORAL_CAPSULE | ORAL | Status: AC

## 2021-06-27 MED ORDER — GABAPENTIN 300 MG PO CAPS: 300.0000 mg | ORAL_CAPSULE | ORAL | Status: AC

## 2021-06-27 MED ORDER — CHLORHEXIDINE GLUCONATE CLOTH 2 % EX PADS
6.0000 | MEDICATED_PAD | Freq: Once | CUTANEOUS | Status: AC
  Administered 2021-06-28: 6 via TOPICAL

## 2021-06-27 MED ORDER — ACETAMINOPHEN 500 MG PO TABS: 1000.0000 mg | ORAL_TABLET | ORAL | Status: AC

## 2021-06-27 MED ORDER — CHLORHEXIDINE GLUCONATE CLOTH 2 % EX PADS: 6.0000 | MEDICATED_PAD | Freq: Once | CUTANEOUS | Status: DC

## 2021-06-27 MED ORDER — BUPIVACAINE LIPOSOME 1.3 % IJ SUSP: 20.0000 mL | Freq: Once | INTRAMUSCULAR | Status: DC

## 2021-06-27 MED ORDER — CEFAZOLIN SODIUM-DEXTROSE 2-4 GM/100ML-% IV SOLN
2.0000 g | INTRAVENOUS | Status: AC
  Administered 2021-06-28: 2 g via INTRAVENOUS

## 2021-06-27 NOTE — Progress Notes (Unsigned)
Surgical Clearance received from Dr Dario Guardian. The patient is cleared at Low risk for surgery.  ?

## 2021-06-27 NOTE — Patient Instructions (Signed)
Your procedure is scheduled on: 06/28/21 Report to DAY SURGERY DEPARTMENT LOCATED ON 2ND FLOOR [facility] ENTRANCE. To find out your arrival time please call [phone] between 1PM - 3PM on 06/27/21.  Remember: Instructions that are not followed completely may result in serious medical risk, up to and including death, or upon the discretion of your surgeon and anesthesiologist your surgery may need to be rescheduled.     _X__ 1. Do not eat any food or drink any liquids after midnight the night before your procedure.                 No gum chewing or hard candies.   __X__2.  On the morning of surgery brush your teeth with toothpaste and water, you                 may rinse your mouth with mouthwash if you wish.  Do not swallow any              toothpaste of mouthwash.     _X__ 3.  No Alcohol for 24 hours before or after surgery.   _X__ 4.  Do Not Smoke or use e-cigarettes For 24 Hours Prior to Your Surgery.                 Do not use any chewable tobacco products for at least 6 hours prior to                 surgery.  ____  5.  Bring all medications with you on the day of surgery if instructed.   __X__  6.  Notify your doctor if there is any change in your medical condition      (cold, fever, infections).     Do not wear jewelry, make-up, hairpins, clips or nail polish. Do not wear lotions, powders, or perfumes.  Do not shave body hair 48 hours prior to surgery. Men may shave face and neck. Do not bring valuables to the hospital.    [facility] is not responsible for any belongings or valuables.  Contacts, dentures/partials or body piercings may not be worn into surgery. Bring a case for your contacts, glasses or hearing aids, a denture cup will be supplied. Leave your suitcase in the car. After surgery it may be brought to your room. For patients admitted to the hospital, discharge time is determined by your treatment team.   Patients discharged the day of surgery will not be  allowed to drive home.   Please read over the following fact sheets that you were given:   MRSA Information, CHG soap  __X__ Take these medicines the morning of surgery with A SIP OF WATER:    1. none  2.   3.   4.  5.  6.  ____ Fleet Enema (as directed)   __X__ Use CHG Soap/SAGE wipes as directed  ____ Use inhalers on the day of surgery  ____ Stop metformin/Janumet/Farxiga 2 days prior to surgery    ____ Take 1/2 of usual insulin dose the night before surgery. No insulin the morning          of surgery.   ____ Stop Blood Thinners Coumadin/Plavix/Xarelto/Pleta/Pradaxa/Eliquis/Effient/Aspirin  on   Or contact your Surgeon, Cardiologist or Medical Doctor regarding  ability to stop your blood thinners  __X__ Stop Anti-inflammatories 7 days before surgery such as Advil, Ibuprofen, Motrin,  BC or Goodies Powder, Naprosyn, Naproxen, Aleve, Aspirin    __X__ Stop all herbals and  supplements, fish oil or vitamins  until after surgery.    ____ Bring C-Pap to the hospital.

## 2021-06-28 ENCOUNTER — Encounter
Admission: RE | Disposition: A | Discharge status: Home / Self Care | Payer: Self-pay | Source: Home / Self Care | Attending: Surgery

## 2021-06-28 ENCOUNTER — Ambulatory Visit: Payer: 59 | Admitting: Anesthesiology

## 2021-06-28 ENCOUNTER — Other Ambulatory Visit: Payer: Self-pay

## 2021-06-28 ENCOUNTER — Ambulatory Visit
Admission: RE | Admit: 2021-06-28 | Discharge: 2021-06-28 | Disposition: A | Discharge status: Home / Self Care | Payer: 59 | Attending: Surgery | Admitting: Surgery

## 2021-06-28 ENCOUNTER — Encounter: Payer: Self-pay | Admitting: Surgery

## 2021-06-28 DIAGNOSIS — K4090 Unilateral inguinal hernia, without obstruction or gangrene, not specified as recurrent: Secondary | ICD-10-CM | POA: Diagnosis present

## 2021-06-28 DIAGNOSIS — I739 Peripheral vascular disease, unspecified: Secondary | ICD-10-CM | POA: Diagnosis not present

## 2021-06-28 DIAGNOSIS — K660 Peritoneal adhesions (postprocedural) (postinfection): Secondary | ICD-10-CM | POA: Diagnosis not present

## 2021-06-28 DIAGNOSIS — Z87891 Personal history of nicotine dependence: Secondary | ICD-10-CM | POA: Diagnosis not present

## 2021-06-28 HISTORY — PX: ROBOTIC ASSISTED LAPAROSCOPIC LYSIS OF ADHESION: SHX6080

## 2021-06-28 HISTORY — PX: INSERTION OF MESH: SHX5868

## 2021-06-28 SURGERY — HERNIORRHAPHY, INGUINAL, ROBOT-ASSISTED, LAPAROSCOPIC
Anesthesia: General | Site: Inguinal | Laterality: Right

## 2021-06-28 MED ORDER — PROPOFOL 10 MG/ML IV BOLUS
INTRAVENOUS | Status: DC | PRN
  Administered 2021-06-28: 200 mg via INTRAVENOUS

## 2021-06-28 MED ORDER — LIDOCAINE HCL (CARDIAC) PF 100 MG/5ML IV SOSY
PREFILLED_SYRINGE | INTRAVENOUS | Status: DC | PRN
  Administered 2021-06-28: 100 mg via INTRAVENOUS

## 2021-06-28 MED ORDER — BUPIVACAINE LIPOSOME 1.3 % IJ SUSP
INTRAMUSCULAR | Status: AC
  Filled 2021-06-28: qty 20

## 2021-06-28 MED ORDER — ROCURONIUM BROMIDE 100 MG/10ML IV SOLN
INTRAVENOUS | Status: DC | PRN
  Administered 2021-06-28: 50 mg via INTRAVENOUS
  Administered 2021-06-28: 30 mg via INTRAVENOUS

## 2021-06-28 MED ORDER — BUPIVACAINE-EPINEPHRINE (PF) 0.25% -1:200000 IJ SOLN
INTRAMUSCULAR | Status: AC
  Filled 2021-06-28: qty 30

## 2021-06-28 MED ORDER — CELECOXIB 200 MG PO CAPS
ORAL_CAPSULE | ORAL | Status: AC
  Administered 2021-06-28: 200 mg via ORAL
  Filled 2021-06-28: qty 1

## 2021-06-28 MED ORDER — IBUPROFEN 800 MG PO TABS: 800.0000 mg | ORAL_TABLET | Freq: Three times a day (TID) | ORAL | 0 refills | Status: DC | PRN

## 2021-06-28 MED ORDER — LACTATED RINGERS IV SOLN: INTRAVENOUS | Status: DC

## 2021-06-28 MED ORDER — 0.9 % SODIUM CHLORIDE (POUR BTL) OPTIME
TOPICAL | Status: DC | PRN
  Administered 2021-06-28: 500 mL

## 2021-06-28 MED ORDER — HYDROCODONE-ACETAMINOPHEN 5-325 MG PO TABS: 1.0000 | ORAL_TABLET | Freq: Four times a day (QID) | ORAL | 0 refills | Status: DC | PRN

## 2021-06-28 MED ORDER — FENTANYL CITRATE (PF) 100 MCG/2ML IJ SOLN
INTRAMUSCULAR | Status: DC | PRN
  Administered 2021-06-28 (×2): 100 ug via INTRAVENOUS

## 2021-06-28 MED ORDER — ACETAMINOPHEN 500 MG PO TABS
ORAL_TABLET | ORAL | Status: AC
  Administered 2021-06-28: 1000 mg via ORAL
  Filled 2021-06-28: qty 2

## 2021-06-28 MED ORDER — OXYCODONE HCL 5 MG PO TABS
ORAL_TABLET | ORAL | Status: AC
  Administered 2021-06-28: 5 mg via ORAL
  Filled 2021-06-28: qty 1

## 2021-06-28 MED ORDER — FENTANYL CITRATE (PF) 100 MCG/2ML IJ SOLN
INTRAMUSCULAR | Status: AC
  Filled 2021-06-28: qty 2

## 2021-06-28 MED ORDER — ORAL CARE MOUTH RINSE: 15.0000 mL | Freq: Once | OROMUCOSAL | Status: AC

## 2021-06-28 MED ORDER — ONDANSETRON HCL 4 MG/2ML IJ SOLN: 4.0000 mg | Freq: Once | INTRAMUSCULAR | Status: DC | PRN

## 2021-06-28 MED ORDER — SUGAMMADEX SODIUM 500 MG/5ML IV SOLN
INTRAVENOUS | Status: DC | PRN
  Administered 2021-06-28: 140 mg via INTRAVENOUS

## 2021-06-28 MED ORDER — GABAPENTIN 300 MG PO CAPS
ORAL_CAPSULE | ORAL | Status: AC
  Administered 2021-06-28: 300 mg via ORAL
  Filled 2021-06-28: qty 1

## 2021-06-28 MED ORDER — LACTATED RINGERS IV SOLN
INTRAVENOUS | Status: DC | PRN
Start: 2021-06-28 — End: 2021-06-28

## 2021-06-28 MED ORDER — DEXAMETHASONE SODIUM PHOSPHATE 10 MG/ML IJ SOLN
INTRAMUSCULAR | Status: DC | PRN
  Administered 2021-06-28: 10 mg via INTRAVENOUS

## 2021-06-28 MED ORDER — MIDAZOLAM HCL 2 MG/2ML IJ SOLN
INTRAMUSCULAR | Status: AC
  Filled 2021-06-28: qty 2

## 2021-06-28 MED ORDER — MIDAZOLAM HCL 2 MG/2ML IJ SOLN
INTRAMUSCULAR | Status: DC | PRN
  Administered 2021-06-28: 2 mg via INTRAVENOUS

## 2021-06-28 MED ORDER — OXYCODONE HCL 5 MG PO TABS: 5.0000 mg | ORAL_TABLET | Freq: Once | ORAL | Status: AC | PRN

## 2021-06-28 MED ORDER — BUPIVACAINE-EPINEPHRINE (PF) 0.25% -1:200000 IJ SOLN
INTRAMUSCULAR | Status: DC | PRN
  Administered 2021-06-28: 10 mL

## 2021-06-28 MED ORDER — FAMOTIDINE 20 MG PO TABS
ORAL_TABLET | ORAL | Status: AC
  Administered 2021-06-28: 20 mg
  Filled 2021-06-28: qty 1

## 2021-06-28 MED ORDER — CHLORHEXIDINE GLUCONATE 0.12 % MT SOLN: 15.0000 mL | Freq: Once | OROMUCOSAL | Status: AC

## 2021-06-28 MED ORDER — ONDANSETRON HCL 4 MG/2ML IJ SOLN
INTRAMUSCULAR | Status: DC | PRN
  Administered 2021-06-28: 4 mg via INTRAVENOUS

## 2021-06-28 MED ORDER — CEFAZOLIN SODIUM-DEXTROSE 2-4 GM/100ML-% IV SOLN
INTRAVENOUS | Status: AC
  Filled 2021-06-28: qty 100

## 2021-06-28 MED ORDER — PROPOFOL 500 MG/50ML IV EMUL
INTRAVENOUS | Status: AC
  Filled 2021-06-28: qty 50

## 2021-06-28 MED ORDER — CHLORHEXIDINE GLUCONATE 0.12 % MT SOLN
OROMUCOSAL | Status: AC
  Administered 2021-06-28: 15 mL via OROMUCOSAL
  Filled 2021-06-28: qty 15

## 2021-06-28 MED ORDER — FENTANYL CITRATE (PF) 100 MCG/2ML IJ SOLN
25.0000 ug | INTRAMUSCULAR | Status: DC | PRN
  Administered 2021-06-28 (×2): 50 ug via INTRAVENOUS

## 2021-06-28 MED ORDER — OXYCODONE HCL 5 MG/5ML PO SOLN: 5.0000 mg | Freq: Once | ORAL | Status: AC | PRN

## 2021-06-28 SURGICAL SUPPLY — 52 items
BLADE CLIPPER SURG (BLADE) ×3 IMPLANT
COVER TIP SHEARS 8 DVNC (MISCELLANEOUS) ×2 IMPLANT
COVER TIP SHEARS 8MM DA VINCI (MISCELLANEOUS) ×1
COVER WAND RF STERILE (DRAPES) ×3 IMPLANT
DERMABOND ADVANCED (GAUZE/BANDAGES/DRESSINGS) ×1
DERMABOND ADVANCED .7 DNX12 (GAUZE/BANDAGES/DRESSINGS) ×2 IMPLANT
DRAPE ARM DVNC X/XI (DISPOSABLE) ×6 IMPLANT
DRAPE COLUMN DVNC XI (DISPOSABLE) ×2 IMPLANT
DRAPE DA VINCI XI ARM (DISPOSABLE) ×3
DRAPE DA VINCI XI COLUMN (DISPOSABLE) ×1
ELECT REM PT RETURN 9FT ADLT (ELECTROSURGICAL) ×3
ELECTRODE REM PT RTRN 9FT ADLT (ELECTROSURGICAL) ×2 IMPLANT
GLOVE ORTHO TXT STRL SZ7.5 (GLOVE) ×9 IMPLANT
GOWN STRL REUS W/ TWL LRG LVL3 (GOWN DISPOSABLE) ×2 IMPLANT
GOWN STRL REUS W/ TWL XL LVL3 (GOWN DISPOSABLE) ×4 IMPLANT
GOWN STRL REUS W/TWL LRG LVL3 (GOWN DISPOSABLE) ×1
GOWN STRL REUS W/TWL XL LVL3 (GOWN DISPOSABLE) ×2
GRASPER SUT TROCAR 14GX15 (MISCELLANEOUS) IMPLANT
IRRIGATION STRYKERFLOW (MISCELLANEOUS) IMPLANT
IRRIGATOR STRYKERFLOW (MISCELLANEOUS)
IV CATH ANGIO 14GX1.88 NO SAFE (IV SOLUTION) ×3 IMPLANT
IV NS 1000ML (IV SOLUTION)
IV NS 1000ML BAXH (IV SOLUTION) IMPLANT
KIT PINK PAD W/HEAD ARE REST (MISCELLANEOUS) ×3
KIT PINK PAD W/HEAD ARM REST (MISCELLANEOUS) ×2 IMPLANT
LABEL OR SOLS (LABEL) ×3 IMPLANT
MANIFOLD NEPTUNE II (INSTRUMENTS) ×3 IMPLANT
MESH 3DMAX LIGHT 4.1X6.2 RT LR (Mesh General) ×1 IMPLANT
NDL INSUFFLATION 14GA 120MM (NEEDLE) IMPLANT
NEEDLE HYPO 22GX1.5 SAFETY (NEEDLE) ×3 IMPLANT
NEEDLE INSUFFLATION 14GA 120MM (NEEDLE) ×3 IMPLANT
PACK LAP CHOLECYSTECTOMY (MISCELLANEOUS) ×3 IMPLANT
SCISSORS METZENBAUM CVD 33 (INSTRUMENTS) ×1 IMPLANT
SEAL CANN UNIV 5-8 DVNC XI (MISCELLANEOUS) ×6 IMPLANT
SEAL XI 5MM-8MM UNIVERSAL (MISCELLANEOUS) ×3
SET TUBE SMOKE EVAC HIGH FLOW (TUBING) ×3 IMPLANT
SOLUTION ELECTROLUBE (MISCELLANEOUS) ×3 IMPLANT
SUT MNCRL 4-0 (SUTURE) ×1
SUT MNCRL 4-0 27XMFL (SUTURE) ×2
SUT STRATAFIX 0 PDS+ CT-2 23 (SUTURE)
SUT V-LOC 90 ABS 3-0 VLT  V-20 (SUTURE)
SUT V-LOC 90 ABS 3-0 VLT V-20 (SUTURE) IMPLANT
SUT VIC AB 0 CT2 27 (SUTURE) ×3 IMPLANT
SUT VIC AB 2-0 RB1 27 (SUTURE) ×3 IMPLANT
SUT VLOC 90 2/L VL 12 GS22 (SUTURE) IMPLANT
SUT VLOC 90 S/L VL9 GS22 (SUTURE) ×1 IMPLANT
SUTURE MNCRL 4-0 27XMF (SUTURE) ×2 IMPLANT
SUTURE STRATFX 0 PDS+ CT-2 23 (SUTURE) IMPLANT
SYS KII FIOS ACCESS ABD 5X100 (TROCAR) ×3
SYSTEM KII FIOS ACES ABD 5X100 (TROCAR) IMPLANT
TROCAR Z-THREAD FIOS 11X100 BL (TROCAR) IMPLANT
WATER STERILE IRR 500ML POUR (IV SOLUTION) ×3 IMPLANT

## 2021-06-28 NOTE — Transfer of Care (Signed)
Immediate Anesthesia Transfer of Care Note ? ?Patient: [name] ? ?Procedure(s) Performed: XI ROBOTIC ASSISTED INGUINAL HERNIA, possible bilateral (Right) ?XI ROBOTIC ASSISTED LAPAROSCOPIC LYSIS OF ADHESION ? ?Patient Location: PACU ? ?Anesthesia Type:General ? ?Level of Consciousness: drowsy ? ?Airway & Oxygen Therapy: Patient Spontanous Breathing and Patient connected to face mask oxygen ? ?Post-op Assessment: Report given to RN and Post -op Vital signs reviewed and stable ? ?Post vital signs: Reviewed and stable ? ?Last Vitals:  ?Vitals Value Taken Time  ?BP    ?Temp    ?Pulse    ?Resp    ?SpO2    ? ? ?Last Pain:  ?Vitals:  ? 06/28/21 1300  ?TempSrc: Temporal  ?PainSc: 0-No pain  ?   ? ?  ? ?Complications: No notable events documented. ?

## 2021-06-28 NOTE — Op Note (Addendum)
Robotic assisted Laparoscopic Transabdominal right inguinal Hernia Repair with Mesh, extensive lysis of adhesions. ?  ?  ?  ?Pre-operative Diagnosis: Right inguinal Hernia ?  ?Post-operative Diagnosis: Same, with the anticipated anterior abdominal wall omental adhesions from prior surgery. ?  ?Procedure: Robotic assisted Laparoscopic  repair of right inguinal hernia(s) ?  ?Surgeon: Campbell Lerner, M.D., FACS ?  ?Anesthesia: GETA ?  ?Findings: Right inguinal hernia       ?  ?Procedure Details  ?The patient was seen again in the Holding Room. The benefits, complications, treatment options, and expected outcomes were discussed with the patient. The risks of bleeding, infection, recurrence of symptoms, failure to resolve symptoms, recurrence of hernia, ischemic orchitis, chronic pain syndrome or neuroma, were reviewed again. The likelihood of improving the patient's symptoms with return to their baseline status is good.  The patient and/or family concurred with the proposed plan, giving informed consent.  The patient was taken to Operating Room, identified  and the procedure verified as Laparoscopic Inguinal Hernia Repair. Laterality confirmed. ? A Time Out was held and the above information confirmed. ?  ?Prior to the induction of general anesthesia, antibiotic prophylaxis was administered. VTE prophylaxis was in place. General endotracheal anesthesia was then administered and tolerated well. After the induction, the abdomen was prepped with Chloraprep and draped in the sterile fashion. The patient was positioned in the supine position. ?  ?After local infiltration of quarter percent Marcaine with epinephrine, stab incision was made left upper quadrant.  On the left at Palmer's point, the Veress needle is passed with sensation of the layers to penetrate the abdominal wall and into the peritoneum.  Saline drop test is confirmed peritoneal placement.  Insufflation is initiated with carbon dioxide to pressures of 15  mmHg. ?An 8.5 mm port is placed to the left upper quadrant, with optical tipped trocar, and 5 mm laparoscope.   ?Pneumoperitoneum maintained w/o HD changes using the AirSeal to pressures of 15 mm Hg with CO2.  ?Midline adhesions were identified, a second 5 mm port is placed inferiorly on the left under direct visualization. ?With hot scissors, the midline adhesions of omentum were lysed from the anterior abdominal wall scar in the epigastrium.  This took an hour to complete the initial lysis of adhesions, not counting the lysis of adhesions done with the robot.  Sufficient space was cleared to allow port placement in the epigastrium.  No evidence of bowel injuries. Hemostasis was maintained with electrocautery, and [place] cautery.  Once I was able to visualize the right upper quadrant, another 8.5 mm port is placed under direct vision.    ?The robot was brought ot the table and docked in the standard fashion, no collision between arms was observed. Instruments were kept under direct view at all times. ?Anterior abdominal wall lysis of adhesions was then continued with fenestrated bipolar forceps and monopolar scissors. ?The laparoscopy revealed a right sided indirect defect(s).   ?For right inguinal hernia repair,  I developed a peritoneal flap. The sac(s) were reduced and dissected free from adjacent structures. We preserved the vas and the vessels, and visualized them to their convergence and beyond in the retroperitoneum. Once dissection was completed a large right sided BARD 3D Light mesh was placed and secured at three points with interrupted 2-0 Vicryl to the pubic tubercle and anteriorly. ?There was good coverage of the direct, indirect and femoral spaces. ?Second look revealed no complications or injuries. ? ?The flap was then closed with 2-0 V-lock suture.  Peritoneal closure without defects.  ?Once assuring that hemostasis was adequate, all needles removed, and the robot was undocked.  ?The ports were  removed, the abdomen desulflated.  4-0 subcuticular Monocryl was used at all skin edges. Dermabond was placed.  ?Patient tolerated the procedure well. There were no complications. He was taken to the recovery room in stable condition.  ?  ? ?      ?Campbell Lerner, M.D., FACS ?06/28/2021, 4:52 PM  ?

## 2021-06-28 NOTE — Interval H&P Note (Signed)
History and Physical Interval Note: ? ?06/28/2021 ?1:12 PM ? ?[name]  has presented today for surgery, with the diagnosis of inguinal hernia.  The various methods of treatment have been discussed with the patient and family. After consideration of risks, benefits and other options for treatment, the patient has consented to  Procedure(s): ?XI ROBOTIC ASSISTED INGUINAL HERNIA, possible bilateral (Right) ?XI ROBOTIC ASSISTED LAPAROSCOPIC LYSIS OF ADHESION (N/A) as a surgical intervention.  The patient's history has been reviewed, patient examined, no change in status, stable for surgery.  I have reviewed the patient's chart and labs.  Questions were answered to the patient's satisfaction.   ? ?Right side is marked.  ? ? ?Campbell Lerner ? ? ?

## 2021-06-28 NOTE — Anesthesia Preprocedure Evaluation (Signed)
Anesthesia Evaluation  ?Patient identified by MRN, date of birth, ID band ?Patient awake ? ? ? ?Reviewed: ?Allergy & Precautions, NPO status , Patient's Chart, lab work & pertinent test results ? ?History of Anesthesia Complications ?Negative for: history of anesthetic complications ? ?Airway ?Mallampati: II ? ?TM Distance: >3 FB ?Neck ROM: Full ? ? ? Dental ? ?(+) Dental Advidsory Given, Poor Dentition ?  ?Pulmonary ?neg shortness of breath, neg sleep apnea, neg COPD, neg recent URI, Current Smoker, former smoker,  ?  ?breath sounds clear to auscultation- rhonchi ?(-) wheezing ? ? ? ? ? Cardiovascular ?Exercise Tolerance: Good ?(-) hypertension(-) angina+ Peripheral Vascular Disease  ?(-) CAD and (-) Past MI  ?Rhythm:Regular Rate:Normal ?- Systolic murmurs and - Diastolic murmurs ? ?  ?Neuro/Psych ?negative neurological ROS ? negative psych ROS  ? GI/Hepatic ?negative GI ROS, Neg liver ROS,   ?Endo/Other  ?negative endocrine ROSneg diabetes ? Renal/GU ?negative Renal ROS  ? ?  ?Musculoskeletal ?negative musculoskeletal ROS ?(+)  ? Abdominal ?(+) - obese,   ?Peds ? Hematology ?negative hematology ROS ?(+)   ?Anesthesia Other Findings ?Past Medical History: ?No date: History of kidney stones ?No date: Leg pain ?04/04/2017: Low back pain ?No date: Patient denies medical problems ?No date: Peripheral vascular disease (HCC) ? ? Reproductive/Obstetrics ? ?  ? ? ? ? ? ? ? ? ? ? ? ? ? ?  ?  ? ? ? ? ? ? ? ? ?Anesthesia Physical ? ?Anesthesia Plan ? ?ASA: 2 ? ?Anesthesia Plan: General  ? ?Post-op Pain Management:   ? ?Induction: Intravenous ? ?PONV Risk Score and Plan: 0 and Ondansetron, Dexamethasone and Treatment may vary due to age or medical condition ? ?Airway Management Planned: Oral ETT ? ?Additional Equipment:  ? ?Intra-op Plan:  ? ?Post-operative Plan: Extubation in OR ? ?Informed Consent: I have reviewed the patients History and Physical, chart, labs and discussed the procedure  including the risks, benefits and alternatives for the proposed anesthesia with the patient or authorized representative who has indicated his/her understanding and acceptance.  ? ? ? ?Dental advisory given ? ?Plan Discussed with: CRNA and Anesthesiologist ? ?Anesthesia Plan Comments:   ? ? ? ? ? ? ?Anesthesia Quick Evaluation ? ?

## 2021-06-28 NOTE — Anesthesia Procedure Notes (Signed)
Procedure Name: Intubation ?Date/Time: 06/28/2021 1:32 PM ?Performed by: Philbert Riser, CRNA ?Pre-anesthesia Checklist: Patient identified, Patient being monitored, Timeout performed, Emergency Drugs available and Suction available ?Patient Re-evaluated:Patient Re-evaluated prior to induction ?Oxygen Delivery Method: Circle system utilized ?Preoxygenation: Pre-oxygenation with 100% oxygen ?Induction Type: IV induction ?Ventilation: Mask ventilation without difficulty ?Laryngoscope Size: McGraph and 4 ?Grade View: Grade I ?Tube type: Oral ?Tube size: 7.0 mm ?Number of attempts: 1 ?Airway Equipment and Method: Stylet ?Placement Confirmation: ETT inserted through vocal cords under direct vision, positive ETCO2 and breath sounds checked- equal and bilateral ?Secured at: 21 cm ?Tube secured with: Tape ?Dental Injury: Teeth and Oropharynx as per pre-operative assessment  ? ? ? ? ?

## 2021-06-28 NOTE — Discharge Instructions (Signed)

## 2021-06-29 ENCOUNTER — Encounter: Payer: Self-pay | Admitting: Surgery

## 2021-07-03 ENCOUNTER — Encounter (INDEPENDENT_AMBULATORY_CARE_PROVIDER_SITE_OTHER): Payer: Self-pay | Admitting: Vascular Surgery

## 2021-07-03 ENCOUNTER — Ambulatory Visit (INDEPENDENT_AMBULATORY_CARE_PROVIDER_SITE_OTHER): Payer: 59 | Admitting: Vascular Surgery

## 2021-07-03 VITALS — BP 142/91 | HR 92 | Resp 16 | Wt 159.0 lb

## 2021-07-03 DIAGNOSIS — I70213 Atherosclerosis of native arteries of extremities with intermittent claudication, bilateral legs: Secondary | ICD-10-CM

## 2021-07-03 DIAGNOSIS — M4722 Other spondylosis with radiculopathy, cervical region: Secondary | ICD-10-CM

## 2021-07-03 DIAGNOSIS — E782 Mixed hyperlipidemia: Secondary | ICD-10-CM

## 2021-07-03 DIAGNOSIS — M47817 Spondylosis without myelopathy or radiculopathy, lumbosacral region: Secondary | ICD-10-CM

## 2021-07-03 DIAGNOSIS — I739 Peripheral vascular disease, unspecified: Secondary | ICD-10-CM

## 2021-07-03 NOTE — Progress Notes (Signed)
? ? ? ? ?MRN : [id number] ? ?[name] is a 56 y.o. (31-Dec-1965) male who presents with chief complaint of check circulation. ? ?History of Present Illness:  ? ?The patient returns s/p aorta bi-femoral bypass on 05/22/2017. ?  ?The patient returns to the office for follow up, he has not been seen for several years as he left the country. ? ?He notes he has again been having pain in his lower extremities but also now associated with pain in his hands too. Patient notes the pain is variable and not always associated with activity.  The pain is somewhat consistent day to day occurring on most days. He also describes increased pain in his legs at night.  The patient notes the pain also occurs with standing and routinely seems worse as the day wears on. The pain has been progressive over the past several years. The patient states these symptoms are causing  a negative impact on quality of life and daily activities which was a factor in the referral. ? ?Of note he has also been neck and arm pain and is experiencing weakness of his hands.  This is associated with neck pain as well. ? ?The patient's overall health care has been stable compared to his previous visit. ?  ?The patient denies amaurosis fugax or recent TIA symptoms. There are no recent neurological changes noted. ?The patient denies history of DVT, PE or superficial thrombophlebitis. ?The patient denies recent episodes of angina or shortness of breath ? ?No outpatient medications have been marked as taking for the 07/03/21 encounter (Appointment) with Gilda Crease, Latina Craver, MD.  ? ? ?Past Medical History:  ?Diagnosis Date  ? History of kidney stones   ? Leg pain   ? Low back pain 04/04/2017  ? Patient denies medical problems   ? Peripheral vascular disease (HCC)   ? ? ?Past Surgical History:  ?Procedure Laterality Date  ? AORTA - BILATERAL FEMORAL ARTERY BYPASS GRAFT N/A 05/22/2017  ? Procedure: AORTA BIFEMORAL BYPASS GRAFT, Aortic bilateral, and internal  ischemia;  Surgeon: Renford Dills, MD;  Location: [facility] ORS;  Service: Vascular;  Laterality: N/A;  ? CENTRAL VENOUS CATHETER INSERTION Left 05/22/2017  ? Procedure: INSERTION CENTRAL LINE ADULT Left Internal Jugular;  Surgeon: Renford Dills, MD;  Location: [facility] ORS;  Service: Vascular;  Laterality: Left;  ? COLONOSCOPY WITH PROPOFOL N/A 03/27/2017  ? Procedure: COLONOSCOPY WITH PROPOFOL;  Surgeon: Earline Mayotte, MD;  Location: [facility] ENDOSCOPY;  Service: Endoscopy;  Laterality: N/A;  ? INSERTION OF MESH Right 06/28/2021  ? Procedure: INSERTION OF MESH;  Surgeon: Campbell Lerner, MD;  Location: [facility] ORS;  Service: General;  Laterality: Right;  ? KIDNEY STONE SURGERY    ? KNEE SURGERY  2006  ? ROBOTIC ASSISTED LAPAROSCOPIC LYSIS OF ADHESION N/A 06/28/2021  ? Procedure: XI ROBOTIC ASSISTED LAPAROSCOPIC LYSIS OF ADHESION;  Surgeon: Campbell Lerner, MD;  Location: [facility] ORS;  Service: General;  Laterality: N/A;  ? ? ?Social History ?Social History  ? ?Tobacco Use  ? Smoking status: Former  ?  Packs/day: 0.25  ?  Years: 30.00  ?  Pack years: 7.50  ?  Types: Cigarettes  ?  Passive exposure: Past  ? Smokeless tobacco: Never  ?Vaping Use  ? Vaping Use: Former  ?Substance Use Topics  ? Alcohol use: No  ? Drug use: No  ? ? ?Family History ?Family History  ?Problem Relation Age of Onset  ? Diabetes Mother   ? Heart disease Mother   ?  Alzheimer's disease Father   ? ? ?No Known Allergies ? ? ?REVIEW OF SYSTEMS (Negative unless checked) ? ?Constitutional: [] Weight loss  [] Fever  [] Chills ?Cardiac: [] Chest pain   [] Chest pressure   [] Palpitations   [] Shortness of breath when laying flat   [] Shortness of breath with exertion. ?Vascular:  [x] Pain in legs with walking   [] Pain in legs at rest  [] History of DVT   [] Phlebitis   [] Swelling in legs   [] Varicose veins   [] Non-healing ulcers ?Pulmonary:   [] Uses home oxygen   [] Productive cough   [] Hemoptysis   [] Wheeze  [] COPD   [] Asthma ?Neurologic:  [] Dizziness   [] Seizures    [] History of stroke   [] History of TIA  [] Aphasia   [] Vissual changes   [] Weakness or numbness in arm   [] Weakness or numbness in leg ?Musculoskeletal:   [] Joint swelling   [] Joint pain   [] Low back pain ?Hematologic:  [] Easy bruising  [] Easy bleeding   [] Hypercoagulable state   [] Anemic ?Gastrointestinal:  [] Diarrhea   [] Vomiting  [] Gastroesophageal reflux/heartburn   [] Difficulty swallowing. ?Genitourinary:  [] Chronic kidney disease   [] Difficult urination  [] Frequent urination   [] Blood in urine ?Skin:  [] Rashes   [] Ulcers  ?Psychological:  [] History of anxiety   []  History of major depression. ? ?Physical Examination ? ?There were no vitals filed for this visit. ?There is no height or weight on file to calculate BMI. ?Gen: WD/WN, NAD ?Head: [place]/AT, No temporalis wasting.  ?Ear/Nose/Throat: Hearing grossly intact, nares w/o erythema or drainage ?Eyes: PER, EOMI, sclera nonicteric.  ?Neck: Supple, no masses.  No bruit or JVD.  ?Pulmonary:  Good air movement, no audible wheezing, no use of accessory muscles.  ?Cardiac: RRR, normal S1, S2, no Murmurs. ?Vascular:  mild trophic changes, no open wounds ?Vessel Right Left  ?Radial Palpable Palpable  ?PT Not Palpable Not Palpable  ?DP Not Palpable Not Palpable  ?Gastrointestinal: soft, non-distended. No guarding/no peritoneal signs.  ?Musculoskeletal: M/S 5/5 throughout.  No visible deformity.  ?Neurologic: CN 2-12 intact. Pain and light touch intact in extremities.  Symmetrical.  Speech is fluent. Motor exam as listed above. ?Psychiatric: Judgment intact, Mood & affect appropriate for pt's clinical situation. ?Dermatologic: No rashes or ulcers noted.  No changes consistent with cellulitis. ? ? ?CBC ?Lab Results  ?Component Value Date  ? WBC 9.0 06/27/2021  ? HGB 15.4 06/27/2021  ? HCT 46.1 06/27/2021  ? MCV 85.2 06/27/2021  ? PLT 241 06/27/2021  ? ? ?BMET ?   ?Component Value Date/Time  ? NA 138 06/27/2021 1027  ? NA 141 04/11/2017 0912  ? K 3.5 06/27/2021 1027  ? CL 103  06/27/2021 1027  ? CO2 30 06/27/2021 1027  ? GLUCOSE 102 (H) 06/27/2021 1027  ? BUN 15 06/27/2021 1027  ? BUN 11 04/11/2017 0912  ? CREATININE 0.77 06/27/2021 1027  ? CALCIUM 9.7 06/27/2021 1027  ? GFRNONAA >60 06/27/2021 1027  ? GFRAA >60 05/27/2017 0454  ? ?Estimated Creatinine Clearance: 89.7 mL/min (by C-G formula based on SCr of 0.77 mg/dL). ? ?COAG ?Lab Results  ?Component Value Date  ? INR 1.04 05/21/2017  ? ? ?Radiology ?DG Cervical Spine 2 or 3 views ? ?Result Date: 06/14/2021 ?CLINICAL DATA:  56 year old male with posterior neck pain for many years. EXAM: CERVICAL SPINE - 2-3 VIEW COMPARISON:  None. FINDINGS: Normal prevertebral soft tissue contour. Mild straightening of cervical lordosis. Cervicothoracic junction alignment is within normal limits. Normal cervical AP alignment. Normal C1-C2 alignment and joint spaces. Mild  to moderate disc space loss with mild endplate spurring at C5-C6. Mild adjacent disc space loss. Cervical spinous processes appear intact and unremarkable. No acute osseous abnormality identified. Negative visible upper chest. IMPRESSION: C5-C6 disc and endplate degeneration. No acute osseous abnormality identified in the cervical spine. Electronically Signed   By: Odessa FlemingH  Hall M.D.   On: 06/14/2021 08:40  ? ?DG ELBOW COMPLETE LEFT (3+VIEW) ? ?Result Date: 06/14/2021 ?CLINICAL DATA:  Left posterior/lateral elbow pain for many years. EXAM: LEFT ELBOW - COMPLETE 3+ VIEW COMPARISON:  None. FINDINGS: There is no evidence of fracture, dislocation, or joint effusion. There is no evidence of arthropathy or other focal bone abnormality. Soft tissues are unremarkable. IMPRESSION: Negative. Electronically Signed   By: Guadlupe SpanishPraneil  Patel M.D.   On: 06/14/2021 08:40   ? ? ?Assessment/Plan ?1. Atherosclerosis of native artery of both lower extremities with intermittent claudication (HCC) ?Recommend: ? ?Patient should undergo arterial duplex of the lower extremity because there has been possible deterioration in  the patient's lower extremity symptoms.  The patient states they are having increased pain and a marked decrease in the distance that they can walk. ? ?The risks and benefits as well as the alternatives w

## 2021-07-03 NOTE — Anesthesia Postprocedure Evaluation (Signed)
Anesthesia Post Note ? ?Patient: [name] ? ?Procedure(s) Performed: XI ROBOTIC ASSISTED INGUINAL HERNIA, (Right: Inguinal) ?XI ROBOTIC ASSISTED LAPAROSCOPIC LYSIS OF ADHESION ?INSERTION OF MESH (Right: Inguinal) ? ?Patient location during evaluation: PACU ?Anesthesia Type: General ?Level of consciousness: combative ?Pain management: pain level controlled ?Vital Signs Assessment: post-procedure vital signs reviewed and stable ?Respiratory status: spontaneous breathing, nonlabored ventilation and respiratory function stable ?Cardiovascular status: blood pressure returned to baseline and stable ?Postop Assessment: no apparent nausea or vomiting ?Anesthetic complications: no ? ? ?No notable events documented. ? ? ?Last Vitals:  ?Vitals:  ? 06/28/21 1710 06/28/21 1732  ?BP:  106/72  ?Pulse: 92 89  ?Resp: 15 17  ?Temp:  36.7 ?C  ?SpO2: 97% 97%  ?  ?Last Pain:  ?Vitals:  ? 06/28/21 1732  ?TempSrc: Temporal  ?PainSc: 3   ? ? ?  ?  ?  ?  ?  ?  ? ?Foye Deer ? ? ? ? ?

## 2021-07-05 ENCOUNTER — Encounter (INDEPENDENT_AMBULATORY_CARE_PROVIDER_SITE_OTHER): Payer: Self-pay | Admitting: Vascular Surgery

## 2021-07-05 DIAGNOSIS — M47817 Spondylosis without myelopathy or radiculopathy, lumbosacral region: Secondary | ICD-10-CM | POA: Insufficient documentation

## 2021-07-05 DIAGNOSIS — I70219 Atherosclerosis of native arteries of extremities with intermittent claudication, unspecified extremity: Secondary | ICD-10-CM | POA: Insufficient documentation

## 2021-07-05 DIAGNOSIS — M47812 Spondylosis without myelopathy or radiculopathy, cervical region: Secondary | ICD-10-CM | POA: Insufficient documentation

## 2021-07-13 ENCOUNTER — Other Ambulatory Visit: Payer: Self-pay | Admitting: Orthopedic Surgery

## 2021-07-13 ENCOUNTER — Ambulatory Visit (INDEPENDENT_AMBULATORY_CARE_PROVIDER_SITE_OTHER): Payer: 59 | Admitting: Physician Assistant

## 2021-07-13 ENCOUNTER — Other Ambulatory Visit: Payer: Self-pay

## 2021-07-13 ENCOUNTER — Encounter: Payer: Self-pay | Admitting: Physician Assistant

## 2021-07-13 VITALS — BP 120/87 | HR 103 | Temp 98.0°F | Ht 65.0 in | Wt 161.6 lb

## 2021-07-13 DIAGNOSIS — K4090 Unilateral inguinal hernia, without obstruction or gangrene, not specified as recurrent: Secondary | ICD-10-CM

## 2021-07-13 DIAGNOSIS — Z09 Encounter for follow-up examination after completed treatment for conditions other than malignant neoplasm: Secondary | ICD-10-CM

## 2021-07-13 DIAGNOSIS — M5412 Radiculopathy, cervical region: Secondary | ICD-10-CM

## 2021-07-13 DIAGNOSIS — M542 Cervicalgia: Secondary | ICD-10-CM

## 2021-07-13 NOTE — Patient Instructions (Signed)

## 2021-07-13 NOTE — Progress Notes (Signed)
[facility] ?POST-OP OFFICE VISIT ? ?07/13/2021 ? ?HPI: ?[name] is a 56 y.o. male 15 days s/p robotic assisted laparoscopic repair of right inguinal hernia and lysis of adhesions with Dr Christian Mate ? ?He is doing well ?Some incisional soreness but otherwise his pain is minimal ?No fever, chills, nausea, emesis ?Tolerating PO; bowel function is baseline for him ?No other complaints  ? ?Vital signs: ?BP 120/87   Pulse (!) 103   Temp 98 ?F (36.7 ?C) (Oral)   Ht 5\' 5"  (1.651 m)   Wt 161 lb 9.6 oz (73.3 kg)   SpO2 98%   BMI 26.89 kg/m?   ? ?Physical Exam: ?Constitutional: Well appearing male, NAD ?Abdomen: Soft, non-tender, non-distended, no rebound/guarding ?Skin: Laparoscopic incisions are healing well, no erythema or drainage. He did have a Monocryl suture hanging out of his right incisions, I removed this.   ? ?Assessment/Plan: ?This is a 56 y.o. male 15 days s/p robotic assisted laparoscopic repair of right inguinal hernia and lysis of adhesions  ? ? - Pain control prn ? - Reviewed wound care recommendation ? - Reviewed lifting restrictions; 6 weeks total; work note given ? - He can follow up on as needed basis; He understands to call with questions/concerns ? ?-- ?Edison Simon, PA-C ?[facility] ?07/13/2021, 2:04 PM ?M-F: 7am - 4pm ? ?

## 2021-07-20 ENCOUNTER — Ambulatory Visit (INDEPENDENT_AMBULATORY_CARE_PROVIDER_SITE_OTHER): Payer: 59

## 2021-07-20 ENCOUNTER — Encounter (INDEPENDENT_AMBULATORY_CARE_PROVIDER_SITE_OTHER): Payer: Self-pay | Admitting: Nurse Practitioner

## 2021-07-20 ENCOUNTER — Ambulatory Visit (INDEPENDENT_AMBULATORY_CARE_PROVIDER_SITE_OTHER): Payer: 59 | Admitting: Nurse Practitioner

## 2021-07-20 ENCOUNTER — Ambulatory Visit
Admission: RE | Admit: 2021-07-20 | Discharge: 2021-07-20 | Disposition: A | Discharge status: Home / Self Care | Payer: 59 | Source: Ambulatory Visit | Attending: Orthopedic Surgery | Admitting: Orthopedic Surgery

## 2021-07-20 VITALS — BP 134/87 | HR 88 | Resp 17 | Ht 65.0 in | Wt 159.0 lb

## 2021-07-20 DIAGNOSIS — M542 Cervicalgia: Secondary | ICD-10-CM | POA: Diagnosis present

## 2021-07-20 DIAGNOSIS — E782 Mixed hyperlipidemia: Secondary | ICD-10-CM | POA: Diagnosis not present

## 2021-07-20 DIAGNOSIS — I70213 Atherosclerosis of native arteries of extremities with intermittent claudication, bilateral legs: Secondary | ICD-10-CM

## 2021-07-20 DIAGNOSIS — M5412 Radiculopathy, cervical region: Secondary | ICD-10-CM | POA: Diagnosis present

## 2021-07-20 DIAGNOSIS — M47817 Spondylosis without myelopathy or radiculopathy, lumbosacral region: Secondary | ICD-10-CM

## 2021-07-20 DIAGNOSIS — I739 Peripheral vascular disease, unspecified: Secondary | ICD-10-CM | POA: Diagnosis not present

## 2021-07-30 ENCOUNTER — Encounter (INDEPENDENT_AMBULATORY_CARE_PROVIDER_SITE_OTHER): Payer: Self-pay | Admitting: Nurse Practitioner

## 2021-07-30 NOTE — Progress Notes (Signed)
Subjective:    Patient ID: [name], male    DOB: September 01, 1965, 56 y.o.   MRN: [id number] No chief complaint on file.   The patient returns s/p aorta bi-femoral bypass on 05/22/2017.   The patient returns to the office for follow up, he has not been seen for several years as he left the country.   He notes he has again been having pain in his lower extremities but also now associated with pain in his hands too. Patient notes the pain is variable and not always associated with activity.  The pain is somewhat consistent day to day occurring on most days. He also describes increased pain in his legs at night.  The patient notes the pain also occurs with standing and routinely seems worse as the day wears on. The pain has been progressive over the past several years. The patient states these symptoms are causing  a negative impact on quality of life and daily activities which was a factor in the referral.   Of note he has also been neck and arm pain and is experiencing weakness of his hands.  This is associated with neck pain as well.   The patient's overall health care has been stable compared to his previous visit.   The patient denies amaurosis fugax or recent TIA symptoms. There are no recent neurological changes noted. The patient denies history of DVT, PE or superficial thrombophlebitis. The patient denies recent episodes of angina or shortness of breath  Today noninvasive studies show a patent aortoiliac bypass graft.  There is some mild plaque noted but nothing significant.  The patient has a right ABI of 1.01 and a left of 1.05.  He has triphasic tibial artery waveforms with good toe waveforms bilaterally.   Review of Systems  Musculoskeletal:  Positive for arthralgias, back pain and gait problem.  All other systems reviewed and are negative.     Objective:   Physical Exam Vitals reviewed.  Cardiovascular:     Rate and Rhythm: Normal rate.     Pulses: Normal pulses.   Pulmonary:     Effort: Pulmonary effort is normal.  Skin:    General: Skin is warm and dry.  Neurological:     Mental Status: He is alert and oriented to person, place, and time.  Psychiatric:        Mood and Affect: Mood normal.        Behavior: Behavior normal.        Thought Content: Thought content normal.        Judgment: Judgment normal.    BP 134/87 (BP Location: Right Arm)   Pulse 88   Resp 17   Ht 5\' 5"  (1.651 m)   Wt 159 lb (72.1 kg)   BMI 26.46 kg/m   Past Medical History:  Diagnosis Date   History of kidney stones    Leg pain    Low back pain 04/04/2017   Patient denies medical problems    Peripheral vascular disease (HCC)     Social History   Socioeconomic History   Marital status: Divorced    Spouse name: Not on file   Number of children: Not on file   Years of education: Not on file   Highest education level: Not on file  Occupational History   Not on file  Tobacco Use   Smoking status: Former    Packs/day: 0.25    Years: 30.00    Pack years: 7.50    Types:  Cigarettes    Passive exposure: Past   Smokeless tobacco: Never  Vaping Use   Vaping Use: Former  Substance and Sexual Activity   Alcohol use: No   Drug use: No   Sexual activity: Not on file  Other Topics Concern   Not on file  Social History Narrative   Not on file   Social Determinants of Health   Financial Resource Strain: Not on file  Food Insecurity: Not on file  Transportation Needs: Not on file  Physical Activity: Not on file  Stress: Not on file  Social Connections: Not on file  Intimate Partner Violence: Not on file    Past Surgical History:  Procedure Laterality Date   AORTA - BILATERAL FEMORAL ARTERY BYPASS GRAFT N/A 05/22/2017   Procedure: AORTA BIFEMORAL BYPASS GRAFT, Aortic bilateral, and internal ischemia;  Surgeon: Renford DillsSchnier, Gregory G, MD;  Location: [facility] ORS;  Service: Vascular;  Laterality: N/A;   CENTRAL VENOUS CATHETER INSERTION Left 05/22/2017    Procedure: INSERTION CENTRAL LINE ADULT Left Internal Jugular;  Surgeon: Renford DillsSchnier, Gregory G, MD;  Location: [facility] ORS;  Service: Vascular;  Laterality: Left;   COLONOSCOPY WITH PROPOFOL N/A 03/27/2017   Procedure: COLONOSCOPY WITH PROPOFOL;  Surgeon: Earline MayotteByrnett, Jeffrey W, MD;  Location: [facility] ENDOSCOPY;  Service: Endoscopy;  Laterality: N/A;   INSERTION OF MESH Right 06/28/2021   Procedure: INSERTION OF MESH;  Surgeon: Campbell Lernerodenberg, Denny, MD;  Location: [facility] ORS;  Service: General;  Laterality: Right;   KIDNEY STONE SURGERY     KNEE SURGERY  2006   ROBOTIC ASSISTED LAPAROSCOPIC LYSIS OF ADHESION N/A 06/28/2021   Procedure: XI ROBOTIC ASSISTED LAPAROSCOPIC LYSIS OF ADHESION;  Surgeon: Campbell Lernerodenberg, Denny, MD;  Location: [facility] ORS;  Service: General;  Laterality: N/A;    Family History  Problem Relation Age of Onset   Diabetes Mother    Heart disease Mother    Alzheimer's disease Father     No Known Allergies     Latest Ref Rng & Units 06/27/2021   10:27 AM 05/29/2017    6:53 AM 05/27/2017    4:54 AM  CBC  WBC 4.0 - 10.5 K/uL 9.0   5.2   3.5    Hemoglobin 13.0 - 17.0 g/dL 09.815.4   11.912.2   14.710.4    Hematocrit 39.0 - 52.0 % 46.1   35.4   29.3    Platelets 150 - 400 K/uL 241   297   154        CMP     Component Value Date/Time   NA 138 06/27/2021 1027   NA 141 04/11/2017 0912   K 3.5 06/27/2021 1027   CL 103 06/27/2021 1027   CO2 30 06/27/2021 1027   GLUCOSE 102 (H) 06/27/2021 1027   BUN 15 06/27/2021 1027   BUN 11 04/11/2017 0912   CREATININE 0.77 06/27/2021 1027   CALCIUM 9.7 06/27/2021 1027   PROT 7.1 06/27/2021 1027   PROT 6.9 04/11/2017 0912   ALBUMIN 3.9 06/27/2021 1027   ALBUMIN 4.4 04/11/2017 0912   AST 28 06/27/2021 1027   ALT 43 06/27/2021 1027   ALKPHOS 75 06/27/2021 1027   BILITOT 0.9 06/27/2021 1027   BILITOT 1.2 04/11/2017 0912   GFRNONAA >60 06/27/2021 1027   GFRAA >60 05/27/2017 0454     VAS US ABI WITH/WO TBI  Result Date: 07/27/2021  LOWER EXTREMITY DOPPLER  STUDY Patient Name:  [name]  Date of Exam:   07/20/2021 Medical Rec #: [id number]  Accession #:    [id number] Date of Birth: [birth year]           Patient Gender: M Patient Age:   89 years Exam Location:  [facility] Procedure:      VAS [place] ABI WITH/WO TBI Referring Phys: Levora Dredge --------------------------------------------------------------------------------  Indications: Claudication, and peripheral artery disease.  Vascular Interventions: Surgery date 05/22/2017 Aortobiiliac bypass graft with                         reimplantation of the IMA into the bypass graft.                         Bilateral CIA                         endarterectomies. Performing Technologist: Debbe Bales RVS  Examination Guidelines: A complete evaluation includes at minimum, Doppler waveform signals and systolic blood pressure reading at the level of bilateral brachial, anterior tibial, and posterior tibial arteries, when vessel segments are accessible. Bilateral testing is considered an integral part of a complete examination. Photoelectric Plethysmograph (PPG) waveforms and toe systolic pressure readings are included as required and additional duplex testing as needed. Limited examinations for reoccurring indications may be performed as noted.  ABI Findings: +---------+------------------+-----+---------+--------+ Right    Rt Pressure (mmHg)IndexWaveform Comment  +---------+------------------+-----+---------+--------+ Brachial 141                                      +---------+------------------+-----+---------+--------+ ATA      157               1.01 triphasic         +---------+------------------+-----+---------+--------+ PTA      153               0.99 triphasic         +---------+------------------+-----+---------+--------+ Great Toe134               0.86 Normal            +---------+------------------+-----+---------+--------+  +---------+------------------+-----+---------+-------+ Left     Lt Pressure (mmHg)IndexWaveform Comment +---------+------------------+-----+---------+-------+ Brachial 155                                     +---------+------------------+-----+---------+-------+ ATA      163               1.05 triphasic        +---------+------------------+-----+---------+-------+ PTA      165               1.06 triphasic        +---------+------------------+-----+---------+-------+ Great Toe136               0.88 Normal           +---------+------------------+-----+---------+-------+ +-------+-----------+-----------+------------+------------+ ABI/TBIToday's ABIToday's TBIPrevious ABIPrevious TBI +-------+-----------+-----------+------------+------------+ Right  1.01       .86        1.08        .78          +-------+-----------+-----------+------------+------------+ Left   1.06       .88        1.05        .85          +-------+-----------+-----------+------------+------------+  Bilateral ABIs and TBIs appear essentially unchanged compared to prior study on 04/2020.  Summary: Right: Resting right ankle-brachial index is within normal range. No evidence of significant right lower extremity arterial disease. The right toe-brachial index is normal. Left: Resting left ankle-brachial index is within normal range. No evidence of significant left lower extremity arterial disease. The left toe-brachial index is normal.  *See table(s) above for measurements and observations.  Electronically signed by Levora Dredge MD on 07/27/2021 at 7:11:39 PM.    Final        Assessment & Plan:   1. PAD (peripheral artery disease) (HCC) Recommend:  The patient has atypical pain symptoms for vascular disease and on exam I do not find evidence of vascular pathology that would explain the patient's symptoms.  Noninvasive studies do not identify significant vascular problems  I suspect the patient is  c/o pseudoclaudication.  Patient has been referred to neurosurgery.  The patient should continue walking and begin a more formal exercise program. The patient should continue his antiplatelet therapy and aggressive treatment of the lipid abnormalities.  Patient will follow-up with me on an annual basis  2. DJD (degenerative joint disease), lumbosacral Patient did have some degenerative changes seen in 2019 MRI.  Patient has been referred to neurosurgery for further evaluation of cervical spine issues as well as lumbosacral issues.  3. Mixed hyperlipidemia Continue statin as ordered and reviewed, no changes at this time    Current Outpatient Medications on File Prior to Visit  Medication Sig Dispense Refill   acetaminophen (TYLENOL) 325 MG tablet Take 650 mg by mouth daily as needed for moderate pain.     aspirin EC 81 MG tablet Take 81 mg by mouth daily. Swallow whole.     atorvastatin (LIPITOR) 20 MG tablet Take 20 mg by mouth daily.     ibuprofen (ADVIL) 800 MG tablet Take 1 tablet (800 mg total) by mouth every 8 (eight) hours as needed. 30 tablet 0   No current facility-administered medications on file prior to visit.    There are no Patient Instructions on file for this visit. No follow-ups on file.   Georgiana Spinner, NP

## 2021-08-17 ENCOUNTER — Other Ambulatory Visit: Payer: Self-pay | Admitting: Neurosurgery

## 2021-08-17 DIAGNOSIS — G8929 Other chronic pain: Secondary | ICD-10-CM

## 2021-08-17 DIAGNOSIS — M5416 Radiculopathy, lumbar region: Secondary | ICD-10-CM

## 2021-08-30 ENCOUNTER — Ambulatory Visit
Admission: RE | Admit: 2021-08-30 | Discharge: 2021-08-30 | Disposition: A | Discharge status: Home / Self Care | Payer: 59 | Source: Ambulatory Visit | Attending: Neurosurgery | Admitting: Neurosurgery

## 2021-08-30 DIAGNOSIS — M5416 Radiculopathy, lumbar region: Secondary | ICD-10-CM | POA: Diagnosis present

## 2021-08-30 DIAGNOSIS — M5441 Lumbago with sciatica, right side: Secondary | ICD-10-CM | POA: Diagnosis present

## 2021-08-30 DIAGNOSIS — G8929 Other chronic pain: Secondary | ICD-10-CM | POA: Insufficient documentation

## 2021-08-30 DIAGNOSIS — M5442 Lumbago with sciatica, left side: Secondary | ICD-10-CM | POA: Insufficient documentation

## 2021-09-12 ENCOUNTER — Ambulatory Visit (INDEPENDENT_AMBULATORY_CARE_PROVIDER_SITE_OTHER): Payer: 59 | Admitting: Neurosurgery

## 2021-09-12 ENCOUNTER — Encounter: Payer: Self-pay | Admitting: Neurosurgery

## 2021-09-12 VITALS — BP 128/78 | Ht 65.0 in | Wt 170.8 lb

## 2021-09-12 DIAGNOSIS — M542 Cervicalgia: Secondary | ICD-10-CM | POA: Diagnosis not present

## 2021-09-12 DIAGNOSIS — M545 Low back pain, unspecified: Secondary | ICD-10-CM

## 2021-09-12 DIAGNOSIS — G8929 Other chronic pain: Secondary | ICD-10-CM | POA: Diagnosis not present

## 2021-09-12 NOTE — Progress Notes (Signed)
Follow-up note: Referring Physician:  Sherrie Mustache, MD [address] [place],  [place] [address]  Primary Physician:  Sherrie Mustache, MD  Chief Complaint:  f/u to review MRI results   History of Present Illness: [name] is a 56 y.o. male who presents today to review recent lumbar MRI. Unfortunately he continues to have posterior radiating leg pain.  He also endorses neck pain which has been unresponsive to cervical injections.  As a result of his chronic pain he has not been able to work and has lost his car in his job.  He denies any new symptoms.  LOV 08/15/2021 [name] is a 56 y.o with a history of aortic bifemoral bypass in 2019, peripheral artery disease, hyperlipidemia, and cardiovascular disease with a history of chronic low back pain who is here today with a chief complaint of persistent low back and bilateral leg pain. He states this started in 2018 without any particular inciting event but has worsened since 2022. He describes the pain as a severe pain that starts in his left low back and radiates into his bilateral buttocks and posterior thighs and calfs worse on the left than the right. It seems to improve with sitting and resting and significantly worsens with walking. He has not undergone any conservative management with physical therapy or injections. He denies any lower extremity weakness, bowel or bladder dysfunction. He also has cervical complaints however the patient was only scheduled for discussion of his lumbar spine today and was 20 minutes late for his appointment as we are not able to discuss the symptoms in detail. He denies any upper extremity weakness and is currently scheduled to undergo injections in his neck. He is in the process of seeking disability for his chronic pain.  Conservative measures:  Physical therapy: none  Multimodal medical therapy including regular antiinflammatories: Gabapentin 300 mg 3 times daily and  Motrin Injections: no epidural steroid injections  Past Surgery: no previous spine surgeries   [name] has no symptoms of cervical myelopathy.  The symptoms are causing a significant impact on the patient's life.    Review of Systems:  A 10 point review of systems is negative, and the pertinent positives and negatives detailed in the HPI.  Past Medical History: Past Medical History:  Diagnosis Date   History of kidney stones    Leg pain    Low back pain 04/04/2017   Patient denies medical problems    Peripheral vascular disease (HCC)     Past Surgical History: Past Surgical History:  Procedure Laterality Date   AORTA - BILATERAL FEMORAL ARTERY BYPASS GRAFT N/A 05/22/2017   Procedure: AORTA BIFEMORAL BYPASS GRAFT, Aortic bilateral, and internal ischemia;  Surgeon: Renford Dills, MD;  Location: [facility];  Service: Vascular;  Laterality: N/A;   CENTRAL VENOUS CATHETER INSERTION Left 05/22/2017   Procedure: INSERTION CENTRAL LINE ADULT Left Internal Jugular;  Surgeon: Renford Dills, MD;  Location: [facility];  Service: Vascular;  Laterality: Left;   COLONOSCOPY WITH PROPOFOL N/A 03/27/2017   Procedure: COLONOSCOPY WITH PROPOFOL;  Surgeon: Earline Mayotte, MD;  Location: [facility];  Service: Endoscopy;  Laterality: N/A;   INSERTION OF MESH Right 06/28/2021   Procedure: INSERTION OF MESH;  Surgeon: Campbell Lerner, MD;  Location: [facility];  Service: General;  Laterality: Right;   KIDNEY STONE SURGERY     KNEE SURGERY  2006   ROBOTIC ASSISTED LAPAROSCOPIC LYSIS OF ADHESION N/A 06/28/2021   Procedure: XI ROBOTIC ASSISTED LAPAROSCOPIC  LYSIS OF ADHESION;  Surgeon: Ronny Bacon, MD;  Location: [facility];  Service: General;  Laterality: N/A;    Allergies: Allergies as of 09/12/2021   (No Known Allergies)    Medications: Outpatient Encounter Medications as of 09/12/2021  Medication Sig   acetaminophen (TYLENOL) 325 MG tablet Take 650 mg by mouth daily as  needed for moderate pain.   aspirin EC 81 MG tablet Take 81 mg by mouth daily. Swallow whole.   atorvastatin (LIPITOR) 20 MG tablet Take 20 mg by mouth daily.   gabapentin (NEURONTIN) 300 MG capsule Take 1 capsule by mouth 3 (three) times daily.   ibuprofen (ADVIL) 800 MG tablet Take 1 tablet (800 mg total) by mouth every 8 (eight) hours as needed.   propranolol (INDERAL) 10 MG tablet Take 10 mg by mouth 3 (three) times daily.   rosuvastatin (CRESTOR) 20 MG tablet Take 20 mg by mouth daily.   No facility-administered encounter medications on file as of 09/12/2021.    Social History: Social History   Tobacco Use   Smoking status: Former    Packs/day: 0.25    Years: 30.00    Total pack years: 7.50    Types: Cigarettes    Passive exposure: Past   Smokeless tobacco: Never  Vaping Use   Vaping Use: Former  Substance Use Topics   Alcohol use: No   Drug use: No    Family Medical History: Family History  Problem Relation Age of Onset   Diabetes Mother    Heart disease Mother    Alzheimer's disease Father     Exam: Vitals:   09/12/21 1443  BP: 128/78     General: no acute distress.  ROM of spine: WNL.  Palpation of spine: non tender.  Strength in the left lower extremity is EHL 5/5, Dorsiflexion 5/5, Plantar flexion 5/5, Hamstring 5/5, Quadricep 5/5, Iliopsoas 5/5. Strength in the right lower extremity is EHL 5/5, Dorsiflexion 5/5, Plantar flexion 5/5, Hamstring 5/5, Quadricep 5/5, Iliopsoas 5/5. Reflexes are 1+ and symmetric at the patella and achilles.   Bilateral lower extremity sensation is intact to light touch.  Clonus is negative  Toes down-going.  Gait is normal.  Imaging: 08/30/2021 MRI L spine IMPRESSION: Lumbar spondylosis, as outlined.   Mild subarticular narrowing on the left at L3-L4 and bilaterally at L4-L5. There is also minimal relative narrowing of the central canal at L4-L5.   Mild neural foraminal narrowing on the left at L3-L4 and on the right at  L4-L5.   Disc degeneration is greatest at L1-L2 (mild-to-moderate at this level).   Mild grade 1 retrolisthesis at L1-L2, L4-L5 and L5-S1.     Electronically Signed   By: Kellie Simmering D.O.   On: 08/31/2021 08:51  I have personally reviewed the images and agree with the above interpretation.  Assessment and Plan: Mr. Bowcutt is a pleasant 56 y.o. male with chronic lumbosacral complaints.  Unfortunately there is no explanatory pathology on his lumbar MRI for his symptoms and therefore no indication for neurosurgical intervention.  I recommended that he continue with physical therapy.  He would like to try aquatic therapy but is unable to afford the [facility].  I encouraged him to contact his insurance company to see if there are any aquatic therapy that they would cover.  I also placed a referral for comprehensive pain management and consideration of possible spinal cord stimulator as he has not responded to cervical injections and is not interested in considering further injections.  He  would like to see one provider for this and I have therefore referred him to Dr. Cherylann Ratel.  I will see him going forward on an as-needed basis.  He expressed understanding and was in agreement with this plan.  I spent a total of 27 minutes in both face-to-face and non-face-to-face activities for this visit on the date of this encounter.  This note was generated in part with voice recognition software and I apologize for any typographical errors that were not detected and corrected.  Manning Charity PA-C  Neurosurgery

## 2021-09-28 ENCOUNTER — Encounter (INDEPENDENT_AMBULATORY_CARE_PROVIDER_SITE_OTHER): Payer: Self-pay | Admitting: *Deleted

## 2021-11-21 ENCOUNTER — Ambulatory Visit
Payer: 59 | Attending: Student in an Organized Health Care Education/Training Program | Admitting: Student in an Organized Health Care Education/Training Program

## 2021-11-21 ENCOUNTER — Encounter: Payer: Self-pay | Admitting: Student in an Organized Health Care Education/Training Program

## 2021-11-21 VITALS — BP 114/85 | HR 83 | Temp 97.7°F | Ht 65.0 in | Wt 176.8 lb

## 2021-11-21 DIAGNOSIS — M4722 Other spondylosis with radiculopathy, cervical region: Secondary | ICD-10-CM | POA: Insufficient documentation

## 2021-11-21 DIAGNOSIS — M792 Neuralgia and neuritis, unspecified: Secondary | ICD-10-CM | POA: Diagnosis present

## 2021-11-21 DIAGNOSIS — I739 Peripheral vascular disease, unspecified: Secondary | ICD-10-CM | POA: Diagnosis present

## 2021-11-21 DIAGNOSIS — I70213 Atherosclerosis of native arteries of extremities with intermittent claudication, bilateral legs: Secondary | ICD-10-CM | POA: Diagnosis present

## 2021-11-21 DIAGNOSIS — G894 Chronic pain syndrome: Secondary | ICD-10-CM | POA: Insufficient documentation

## 2021-11-21 DIAGNOSIS — M5416 Radiculopathy, lumbar region: Secondary | ICD-10-CM | POA: Diagnosis present

## 2021-11-21 DIAGNOSIS — M47817 Spondylosis without myelopathy or radiculopathy, lumbosacral region: Secondary | ICD-10-CM | POA: Insufficient documentation

## 2021-11-21 MED ORDER — PREGABALIN 50 MG PO CAPS: 50.0000 mg | ORAL_CAPSULE | Freq: Two times a day (BID) | ORAL | 1 refills | Status: DC

## 2021-11-21 NOTE — Progress Notes (Signed)
Patient: [name]  Service Category: E/M  Provider: Gillis Santa, MD  DOB: Jul 17, 1965  DOS: 11/21/2021  Referring Provider: Loleta Dicker, PA  MRN: [id number]  Setting: Ambulatory outpatient  PCP: [place], MD  Type: New Patient  Specialty: Interventional Pain Management    Location: Office  Delivery: Face-to-face     Primary Reason(s) for Visit: Encounter for initial evaluation of one or more chronic problems (new to examiner) potentially causing chronic pain, and posing a threat to normal musculoskeletal function. (Level of risk: High) CC: Back Pain (lower) and Neck Pain  HPI  [name] is a 56 y.o. year old, male patient, who comes for the first time to our practice referred by [place] Dicker, PA for our initial evaluation of his chronic pain. He has Rectal pain; Encounter for screening colonoscopy; Low back pain; PAD (peripheral artery disease) ([name]); Chronic distal aortic occlusion ([name]); Leg pain; Hyperlipidemia; Ejaculatory disorder; Bilateral carpal tunnel syndrome; Right inguinal hernia; Atherosclerosis of native arteries of extremity with intermittent claudication (HCC); DJD (degenerative joint disease) of cervical spine; and DJD (degenerative joint disease), lumbosacral on their problem list. Today he comes in for evaluation of his Back Pain (lower) and Neck Pain  Pain Assessment: Location: Lower Back Radiating: to left buttock; as the day goes on; when walking, pain increases then travels down both legs and eventually unable to walk anymore Onset: More than a month ago Duration: Chronic pain Quality: Pressure Severity: 10-Worst pain ever/10 (subjective, self-reported pain score)  Effect on ADL: when pain is bad, unable to walk at all wh Timing: Constant Modifying factors: laying down, elevating legs BP: 114/85  HR: 83  Onset and Duration: Present longer than 3 months2017 Cause of pain:  construction Severity: NAS-11 on the average: 10/10 Timing:  Afternoon and Night Aggravating Factors: Walking  Patient is a pleasant 56 year old male who presents with a chief complaint of chronic low back pain with radiation into bilateral legs.  This is been going on since 2018.  Of note he did have a aorto bilateral femoral bypass on 05/22/2017.  He states that after that surgery, he has not been able to walk as long as he could before.  He states that his low back pain has worsened.  He also describes numbness and tingling in both of his feet.  He endorses spasms of posterior lateral thigh as well as his calves.  He has tried doing stretching exercises that have been recommended for him in the past.  He has been evaluated by vascular surgery to see if occlusive symptoms could be contributing to vascular claudication which does not seem to be the case although I think there is a component of this.  He also describes neck pain which she has had a cervical epidural steroid injection for.  His lumbar spine MRI is largely unremarkable.  Shows moderate degenerative disc disease at L1-L2.  He recently moved back from [name] a couple of years ago.  He does have mild lumbar spondylosis in his low back.  He further states that his feet stay cold again suggesting a vascular issue.  In regards to his cervical spine he does have a left foraminal disc protrusion at C6-C7 with moderate canal stenosis.  He states that he would like to hold off on injections at this time.  He is interested in medication management.  He states that he has found benefit with oxycodone in the past.  He has tried gabapentin in the past without any benefit.  This was increased to 300 mg 3 times daily and the patient states that he was taking slightly more to see if it would have an impact but he did not.  He denies having tried Lyrica.  He takes ibuprofen 4 to 800 mg as needed.  I cautioned him on high dose NSAID intake as it pertains to his vascular disease.  Chronic NSAID therapy can increase risk of adverse  cardiovascular events.  He has difficulty completing ADLs.  Interpreter was present throughout the visit.    Of note, patient had severely low vitamin B12 levels in the past however he was not in town to receive the results.  I will recheck his vitamin B12 levels as that may explain his neurogenic pain.  I will recommend vitamin D supplementation if he is deficient in his vitamin B12 level.    Historic Controlled Substance Pharmacotherapy Review  PMP and historical list of controlled substances: Hydrocodone 5 mg twice daily as needed last prescription was 06/28/2021. Historical Monitoring: The patient  reports no history of drug use. List of prior UDS Testing: No results found for: "MDMA", "COCAINSCRNUR", "PCPSCRNUR", "PCPQUANT", "CANNABQUANT", "THCU", "ETH", "CBDTHCR", "D8THCCBX", "D9THCCBX" Historical Background Evaluation: [name] PMP: PDMP not reviewed this encounter. Review of the past 49-months conducted.              [place] Department of public safety, offender search: Editor, commissioning Information) Non-contributory Risk Assessment Profile: Aberrant behavior: None observed or detected today Risk factors for fatal opioid overdose: None identified today Fatal overdose hazard ratio (HR): Calculation deferred Non-fatal overdose hazard ratio (HR): Calculation deferred Risk of opioid abuse or dependence: 0.7-3.0% with doses ? 36 MME/day and 6.1-26% with doses ? 120 MME/day. Substance use disorder (SUD) risk level: See below Personal History of Substance Abuse (SUD-Substance use disorder):  Alcohol: Negative  Illegal Drugs: Negative  Rx Drugs: Negative  ORT Risk Level calculation: Low Risk  Opioid Risk Tool - 11/21/21 0904       Family History of Substance Abuse   Alcohol Negative    Illegal Drugs Negative    Rx Drugs Negative      Personal History of Substance Abuse   Alcohol Negative    Illegal Drugs Negative    Rx Drugs Negative      Age   Age between 45-45 years  No      History of  Preadolescent Sexual Abuse   History of Preadolescent Sexual Abuse Negative or Male      Psychological Disease   Psychological Disease Negative    Depression Negative      Total Score   Opioid Risk Tool Scoring 0    Opioid Risk Interpretation Low Risk            ORT Scoring interpretation table:  Score <3 = Low Risk for SUD  Score between 4-7 = Moderate Risk for SUD  Score >8 = High Risk for Opioid Abuse   PHQ-2 Depression Scale:  Total score:    PHQ-2 Scoring interpretation table: (Score and probability of major depressive disorder)  Score 0 = No depression  Score 1 = 15.4% Probability  Score 2 = 21.1% Probability  Score 3 = 38.4% Probability  Score 4 = 45.5% Probability  Score 5 = 56.4% Probability  Score 6 = 78.6% Probability   PHQ-9 Depression Scale:  Total score:    PHQ-9 Scoring interpretation table:  Score 0-4 = No depression  Score 5-9 = Mild depression  Score 10-14 = Moderate depression  Score  15-19 = Moderately severe depression  Score 20-27 = Severe depression (2.4 times higher risk of SUD and 2.89 times higher risk of overuse)   Pharmacologic Plan: As per protocol, I have not taken over any controlled substance management, pending the results of ordered tests and/or consults.            Initial impression: Pending review of available data and ordered tests.  Meds   Current Outpatient Medications:    acetaminophen (TYLENOL) 325 MG tablet, Take 650 mg by mouth daily as needed for moderate pain., Disp: , Rfl:    aspirin EC 81 MG tablet, Take 81 mg by mouth daily. Swallow whole., Disp: , Rfl:    ibuprofen (ADVIL) 800 MG tablet, Take 1 tablet (800 mg total) by mouth every 8 (eight) hours as needed., Disp: 30 tablet, Rfl: 0   pregabalin (LYRICA) 50 MG capsule, Take 1 capsule (50 mg total) by mouth 2 (two) times daily., Disp: 60 capsule, Rfl: 1   propranolol (INDERAL) 10 MG tablet, Take 10 mg by mouth 3 (three) times daily., Disp: , Rfl:    rosuvastatin  (CRESTOR) 20 MG tablet, Take 20 mg by mouth daily., Disp: , Rfl:   Imaging Review  Cervical Imaging: Cervical MR wo contrast: Results for orders placed during the hospital encounter of 07/20/21  MR CERVICAL SPINE WO CONTRAST  Narrative CLINICAL DATA:  Left neck pain radiating down the left arm with weakness.  EXAM: MRI CERVICAL SPINE WITHOUT CONTRAST  TECHNIQUE: Multiplanar, multisequence MR imaging of the cervical spine was performed. No intravenous contrast was administered.  COMPARISON:  Cervical spine radiographs 06/13/2021  FINDINGS: Alignment: Normal.  Vertebrae: No fracture, suspicious marrow lesion, or significant marrow edema.  Cord: Normal signal.  Posterior Fossa, vertebral arteries, paraspinal tissues: Unremarkable.  Disc levels:  C2-3: Negative.  C3-4: A small central disc protrusion in moderate spinal stenosis, mildly indenting the ventral spinal cord. There is mild disc bulging and uncovertebral spurring without significant neural foraminal stenosis.  C4-5: Disc bulging and uncovertebral spurring result in mild spinal stenosis and mild right neural foraminal stenosis.  C5-6: Moderate disc space narrowing. Disc bulging and uncovertebral spurring result in mild spinal stenosis and moderate right and mild-to-moderate left neural foraminal stenosis.  C6-7: A small central disc protrusion slightly indents the ventral spinal cord without significant spinal stenosis. Disc bulging, uncovertebral spurring, and a left foraminal disc protrusion result in moderate left neural foraminal stenosis with potential left C7 nerve root impingement.  C7-T1: Mild facet arthrosis without stenosis.  IMPRESSION: 1. Left foraminal disc protrusion at C6-7 with moderate left neural foraminal stenosis and potential left C7 nerve root impingement. 2. Moderate spinal stenosis at C3-4. 3. Mild spinal stenosis at C4-5 and C5-6.   Electronically Signed By: Logan Bores  M.D. On: 07/20/2021 12:54   Narrative CLINICAL DATA:  56 year old male with posterior neck pain for many years.  EXAM: CERVICAL SPINE - 2-3 VIEW  COMPARISON:  None.  FINDINGS: Normal prevertebral soft tissue contour. Mild straightening of cervical lordosis. Cervicothoracic junction alignment is within normal limits. Normal cervical AP alignment. Normal C1-C2 alignment and joint spaces. Mild to moderate disc space loss with mild endplate spurring at T0-Z6. Mild adjacent disc space loss. Cervical spinous processes appear intact and unremarkable. No acute osseous abnormality identified. Negative visible upper chest.  IMPRESSION: C5-C6 disc and endplate degeneration. No acute osseous abnormality identified in the cervical spine.   Electronically Signed By: Genevie Ann M.D. On: 06/14/2021 08:40  Lumbosacral Imaging:  Lumbar MR wo contrast: Results for orders placed during the hospital encounter of 08/30/21  MR LUMBAR SPINE WO CONTRAST  Narrative CLINICAL DATA:  Lumbar radiculopathy. Chronic left-sided low back pain with bilateral sciatica. Additional history provided by scanning technologist: Patient reports worsening chronic low back pain. Pain in central and left greater than right low back. Buttock, hip and leg pain.  EXAM: MRI LUMBAR SPINE WITHOUT CONTRAST  TECHNIQUE: Multiplanar, multisequence MR imaging of the lumbar spine was performed. No intravenous contrast was administered.  COMPARISON:  CTA abdomen/pelvis of 04/26/2017.  FINDINGS: Segmentation: 5 lumbar vertebrae. The caudal most well-formed intervertebral disc space is designated L5-S1.  Alignment: 2 mm L1-L2 grade 1 retrolisthesis. T trace grade 1 retrolisthesis at L4-L5 and L5-S1. Race L5-S1 grade 1 retrolisthesis.  Vertebrae: No lumbar vertebral compression fracture. No significant marrow edema or focal suspicious osseous lesion.  Conus medullaris and cauda equina: Conus extends to the L2 level.  No signal abnormality within the visualized distal spinal cord.  Paraspinal and other soft tissues: Left renal sinus cysts. No paraspinal mass or collection.  Disc levels:  Mild-to-moderate disc degeneration at L1-L2. No more than mild disc degeneration at the remaining levels.  T12-L1: Moderate facet arthrosis. No significant disc herniation or stenosis.  L1-L2: 2 mm grade 1 retrolisthesis. Disc bulge. Mild facet arthrosis. Minimal effacement of the ventral thecal sac. No significant foraminal stenosis.  L2-L3: Mild facet arthrosis. No significant disc herniation or stenosis.  L3-L4: Small disc bulge. Moderate facet arthrosis. Ligamentum flavum hypertrophy on the left. Mild relative left subarticular narrowing (without nerve root impingement). Central canal patent. Mild left inferior neural foraminal narrowing.  L4-L5: Trace grade 1 retrolisthesis. Disc bulge with endplate spurring. Central posterior annular fissure. Mild facet arthrosis. Mild right greater than left subarticular narrowing (without appreciable nerve root impingement) (series 8, image 29). Minimal relative narrowing of the central canal. Mild right neural foraminal narrowing.  L5-S1: Trace grade 1 retrolisthesis. Trace grade 1 retrolisthesis. Small disc bulge. Central posterior annular fissure. Mild facet arthrosis. No significant spinal canal stenosis or neural foraminal narrowing.  IMPRESSION: Lumbar spondylosis, as outlined.  Mild subarticular narrowing on the left at L3-L4 and bilaterally at L4-L5. There is also minimal relative narrowing of the central canal at L4-L5.  Mild neural foraminal narrowing on the left at L3-L4 and on the right at L4-L5.  Disc degeneration is greatest at L1-L2 (mild-to-moderate at this level).  Mild grade 1 retrolisthesis at L1-L2, L4-L5 and L5-S1.   Electronically Signed By: Kellie Simmering D.O. On: 08/31/2021 08:51 DG ELBOW COMPLETE LEFT  (3+VIEW)  Narrative CLINICAL DATA:  Left posterior/lateral elbow pain for many years.  EXAM: LEFT ELBOW - COMPLETE 3+ VIEW  COMPARISON:  None.  FINDINGS: There is no evidence of fracture, dislocation, or joint effusion. There is no evidence of arthropathy or other focal bone abnormality. Soft tissues are unremarkable.  IMPRESSION: Negative.   Electronically Signed By: Macy Mis M.D. On: 06/14/2021 08:40    Complexity Note: Imaging results reviewed.                         ROS  Cardiovascular: Daily Aspirin intake and Heart surgery Pulmonary or Respiratory: No reported pulmonary signs or symptoms such as wheezing and difficulty taking a deep full breath (Asthma), difficulty blowing air out (Emphysema), coughing up mucus (Bronchitis), persistent dry cough, or temporary stoppage of breathing during sleep Neurological: No reported neurological signs or symptoms such as seizures, abnormal skin  sensations, urinary and/or fecal incontinence, being born with an abnormal open spine and/or a tethered spinal cord Psychological-Psychiatric: No reported psychological or psychiatric signs or symptoms such as difficulty sleeping, anxiety, depression, delusions or hallucinations (schizophrenial), mood swings (bipolar disorders) or suicidal ideations or attempts Gastrointestinal: Heartburn due to stomach pushing into lungs (Hiatal hernia) Genitourinary: Passing kidney stones Hematological: No reported hematological signs or symptoms such as prolonged bleeding, low or poor functioning platelets, bruising or bleeding easily, hereditary bleeding problems, low energy levels due to low hemoglobin or being anemic Endocrine: No reported endocrine signs or symptoms such as high or low blood sugar, rapid heart rate due to high thyroid levels, obesity or weight gain due to slow thyroid or thyroid disease Rheumatologic: No reported rheumatological signs and symptoms such as fatigue, joint pain,  tenderness, swelling, redness, heat, stiffness, decreased range of motion, with or without associated rash Musculoskeletal: Negative for myasthenia gravis, muscular dystrophy, multiple sclerosis or malignant hyperthermia Work History: Out of work due to pain  Allergies  Mr. Erman has No Known Allergies.  Laboratory Chemistry Profile   Renal Lab Results  Component Value Date   BUN 15 06/27/2021   CREATININE 0.77 06/27/2021   BCR 16 04/11/2017   GFRAA >60 05/27/2017   GFRNONAA >60 06/27/2021   PROTEINUR 30 (A) 06/20/2021     Electrolytes Lab Results  Component Value Date   NA 138 06/27/2021   K 3.5 06/27/2021   CL 103 06/27/2021   CALCIUM 9.7 06/27/2021   MG 1.9 05/27/2017   PHOS 3.9 05/28/2017     Hepatic Lab Results  Component Value Date   AST 28 06/27/2021   ALT 43 06/27/2021   ALBUMIN 3.9 06/27/2021   ALKPHOS 75 06/27/2021     ID Lab Results  Component Value Date   STAPHAUREUS NEGATIVE 05/21/2017   MRSAPCR NEGATIVE 05/21/2017     Bone No results found for: "VD25OH", "VD125OH2TOT", "LY6503TW6", "FK8127NT7", "25OHVITD1", "25OHVITD2", "00FVCBSW9", "TESTOFREE", "TESTOSTERONE"   Endocrine Lab Results  Component Value Date   GLUCOSE 102 (H) 06/27/2021   GLUCOSEU NEGATIVE 06/20/2021     Neuropathy No results found for: "VITAMINB12", "FOLATE", "HGBA1C", "HIV"   CNS No results found for: "COLORCSF", "APPEARCSF", "RBCCOUNTCSF", "WBCCSF", "POLYSCSF", "LYMPHSCSF", "EOSCSF", "PROTEINCSF", "GLUCCSF", "JCVIRUS", "CSFOLI", "IGGCSF", "LABACHR", "ACETBL"   Inflammation (CRP: Acute  ESR: Chronic) No results found for: "CRP", "ESRSEDRATE", "LATICACIDVEN"   Rheumatology No results found for: "RF", "ANA", "LABURIC", "URICUR", "LYMEIGGIGMAB", "LYMEABIGMQN", "HLAB27"   Coagulation Lab Results  Component Value Date   INR 1.04 05/21/2017   LABPROT 13.5 05/21/2017   APTT 31 05/21/2017   PLT 241 06/27/2021     Cardiovascular Lab Results  Component Value Date    HGB 15.4 06/27/2021   HCT 46.1 06/27/2021     Screening Lab Results  Component Value Date   STAPHAUREUS NEGATIVE 05/21/2017   MRSAPCR NEGATIVE 05/21/2017     Cancer No results found for: "CEA", "CA125", "LABCA2"   Allergens No results found for: "ALMOND", "APPLE", "ASPARAGUS", "AVOCADO", "BANANA", "BARLEY", "BASIL", "BAYLEAF", "GREENBEAN", "LIMABEAN", "WHITEBEAN", "BEEFIGE", "REDBEET", "BLUEBERRY", "BROCCOLI", "CABBAGE", "MELON", "CARROT", "CASEIN", "CASHEWNUT", "CAULIFLOWER", "CELERY"     Note: Lab results reviewed.  [place]  Drug: Mr. Kon  reports no history of drug use. Alcohol:  reports no history of alcohol use. Tobacco:  reports that he has been smoking cigarettes. He has a 7.50 pack-year smoking history. He has been exposed to tobacco smoke. He has never used smokeless tobacco. Medical:  has a past medical history of History of  kidney stones, Leg pain, Low back pain (04/04/2017), Patient denies medical problems, and Peripheral vascular disease ([place]). Family: family history includes Alzheimer's disease in his father; Diabetes in his mother; Heart disease in his mother.  Past Surgical History:  Procedure Laterality Date   AORTA - BILATERAL FEMORAL ARTERY BYPASS GRAFT N/A 05/22/2017   Procedure: AORTA BIFEMORAL BYPASS GRAFT, Aortic bilateral, and internal ischemia;  Surgeon: Katha Cabal, MD;  Location: [facility] ORS;  Service: Vascular;  Laterality: N/A;   CENTRAL VENOUS CATHETER INSERTION Left 05/22/2017   Procedure: [place] LINE ADULT Left Internal Jugular;  Surgeon: Katha Cabal, MD;  Location: [facility] OR[facility];  Service: Vascular;  Laterality: Left;   COLONOSCOPY WITH PROPOFOL N/A 03/27/2017   Procedure: COLONOSCOPY WITH PROPOFOL;  Surgeon: Robert Bellow, MD;  Location: [facility];  Service: Endoscopy;  Laterality: N/A;   INSERTION OF MESH Right 06/28/2021   Procedure: INSERTION OF MESH;  Surgeon: Ronny Bacon, MD;  Location: [facility] ORS;  Service:  General;  Laterality: Right;   KIDNEY STONE SURGERY     KNEE SURGERY  2006   ROBOTIC ASSISTED LAPAROSCOPIC LYSIS OF ADHESION N/A 06/28/2021   Procedure: XI ROBOTIC ASSISTED LAPAROSCOPIC LYSIS OF ADHESION;  Surgeon: Ronny Bacon, MD;  Location: [facility] OR[facility];  Service: General;  Laterality: N/A;   Active Ambulatory Problems    Diagnosis Date Noted   Rectal pain 03/15/2017   Encounter for screening colonoscopy 03/15/2017   Low back pain 04/04/2017   PAD (peripheral artery disease) ([place]) 04/30/2017   Chronic distal aortic occlusion ([place]) 04/30/2017   Leg pain 04/30/2017   Hyperlipidemia 12/13/2017   Ejaculatory disorder 12/13/2017   Bilateral carpal tunnel syndrome 05/09/2020   Right inguinal hernia 06/22/2021   Atherosclerosis of native arteries of extremity with intermittent claudication ([place]) 07/05/2021   DJD (degenerative joint disease) of cervical spine 07/05/2021   DJD (degenerative joint disease), lumbosacral 07/05/2021   Resolved Ambulatory Problems    Diagnosis Date Noted   No Resolved Ambulatory Problems   Past Medical History:  Diagnosis Date   History of kidney stones    Patient denies medical problems    Peripheral vascular disease ([place])    Constitutional Exam  General appearance: Well nourished, well developed, and well hydrated. In no apparent acute distress Vitals:   11/21/21 0841  BP: 114/85  Pulse: 83  Temp: 97.7 F (36.5 C)  TempSrc: Temporal  SpO2: 100%  Weight: 176 lb 12.8 oz (80.2 kg)  Height: $Remove'5\' 5"'XhxKSUW$  (1.651 m)   BMI Assessment: Estimated body mass index is 29.42 kg/m as calculated from the following:   Height as of this encounter: $RemoveBeforeD'5\' 5"'[id number]$  (1.651 m).   Weight as of this encounter: 176 lb 12.8 oz (80.2 kg).  BMI interpretation table: BMI level Category Range association with higher incidence of chronic pain  <18 kg/m2 Underweight   18.5-24.9 kg/m2 Ideal body weight   25-29.9 kg/m2 Overweight Increased incidence by 20%  30-34.9 kg/m2 Obese (Class I)  Increased incidence by 68%  35-39.9 kg/m2 Severe obesity (Class II) Increased incidence by 136%  >40 kg/m2 Extreme obesity (Class III) Increased incidence by 254%   Patient's current BMI Ideal Body weight  Body mass index is 29.42 kg/m. Ideal body weight: 61.5 kg (135 lb 9.3 oz) Adjusted ideal body weight: 69 kg (152 lb 1.1 oz)   BMI Readings from Last 4 Encounters:  11/21/21 29.42 kg/m  09/12/21 28.42 kg/m  07/20/21 26.46 kg/m  07/13/21 26.89 kg/m   Wt Readings from Last 4  Encounters:  11/21/21 176 lb 12.8 oz (80.2 kg)  09/12/21 170 lb 12.8 oz (77.5 kg)  07/20/21 159 lb (72.1 kg)  07/13/21 161 lb 9.6 oz (73.3 kg)    Psych/Mental status: Alert, oriented x 3 (person, place, & time)       Eyes: PERLA Respiratory: No evidence of acute respiratory distress  Cervical Spine Area Exam  Skin & Axial Inspection: No masses, redness, edema, swelling, or associated skin lesions Alignment: Symmetrical Functional ROM: Pain restricted ROM      Stability: No instability detected Muscle Tone/Strength: Functionally intact. No obvious neuro-muscular anomalies detected. Sensory (Neurological): Neurogenic pain pattern Palpation: No palpable anomalies             Upper Extremity (UE) Exam    Side: Right upper extremity  Side: Left upper extremity  Skin & Extremity Inspection: Skin color, temperature, and hair growth are WNL. No peripheral edema or cyanosis. No masses, redness, swelling, asymmetry, or associated skin lesions. No contractures.  Skin & Extremity Inspection: Skin color, temperature, and hair growth are WNL. No peripheral edema or cyanosis. No masses, redness, swelling, asymmetry, or associated skin lesions. No contractures.  Functional ROM: Pain restricted ROM for shoulder and elbow  Functional ROM: Pain restricted ROM for shoulder and elbow  Muscle Tone/Strength: Functionally intact. No obvious neuro-muscular anomalies detected.  Muscle Tone/Strength: Functionally intact. No obvious  neuro-muscular anomalies detected.  Sensory (Neurological): Neurogenic pain pattern          Sensory (Neurological): Neurogenic pain pattern          Palpation: No palpable anomalies              Palpation: No palpable anomalies              Provocative Test(s):  Phalen's test: deferred Tinel's test: deferred Apley's scratch test (touch opposite shoulder):  Action 1 (Across chest): deferred Action 2 (Overhead): deferred Action 3 (LB reach): deferred   Provocative Test(s):  Phalen's test: deferred Tinel's test: deferred Apley's scratch test (touch opposite shoulder):  Action 1 (Across chest): deferred Action 2 (Overhead): deferred Action 3 (LB reach): deferred    Large abdominal incision slightly tender to palpation  Lumbar Spine Area Exam  Skin & Axial Inspection: No masses, redness, or swelling Alignment: Symmetrical Functional ROM: Pain restricted ROM       Stability: No instability detected Muscle Tone/Strength: Functionally intact. No obvious neuro-muscular anomalies detected. Sensory (Neurological): Musculoskeletal pain pattern Palpation: No palpable anomalies       Provocative Tests: Hyperextension/rotation test: (+) bilaterally for facet joint pain. Lumbar quadrant test (Kemp's test): (+) bilaterally for facet joint pain.  Gait & Posture Assessment  Ambulation: Unassisted Gait: Relatively normal for age and body habitus Posture: WNL   Lower Extremity Exam    Side: Right lower extremity  Side: Left lower extremity  Stability: No instability observed          Stability: No instability observed          Skin & Extremity Inspection: Skin color, temperature, and hair growth are WNL. No peripheral edema or cyanosis. No masses, redness, swelling, asymmetry, or associated skin lesions. No contractures.  Skin & Extremity Inspection: Skin color, temperature, and hair growth are WNL. No peripheral edema or cyanosis. No masses, redness, swelling, asymmetry, or associated skin  lesions. No contractures.  Functional ROM: Unrestricted ROM                  Functional ROM: Unrestricted ROM  Muscle Tone/Strength: Functionally intact. No obvious neuro-muscular anomalies detected.  Muscle Tone/Strength: Functionally intact. No obvious neuro-muscular anomalies detected.  Sensory (Neurological): Neurogenic pain pattern        Sensory (Neurological): Neurogenic pain pattern        DTR: Patellar: deferred today Achilles: deferred today Plantar: deferred today  DTR: Patellar: deferred today Achilles: deferred today Plantar: deferred today  Palpation: No palpable anomalies  Palpation: No palpable anomalies   5 out of 5 strength bilateral lower extremity: Plantar flexion, dorsiflexion, knee flexion, knee extension.   Assessment  Primary Diagnosis & Pertinent Problem List: The primary encounter diagnosis was Lumbar radicular pain. Diagnoses of Neuropathic pain, PAD (peripheral artery disease) ([place]), Atherosclerosis of native artery of both lower extremities with intermittent claudication ([place]), Osteoarthritis of spine with radiculopathy, cervical region, DJD (degenerative joint disease), lumbosacral, and Chronic pain syndrome were also pertinent to this visit.  Visit Diagnosis (New problems to examiner): 1. Lumbar radicular pain   2. Neuropathic pain   3. PAD (peripheral artery disease) ([place])   4. Atherosclerosis of native artery of both lower extremities with intermittent claudication ([place])   5. Osteoarthritis of spine with radiculopathy, cervical region   6. DJD (degenerative joint disease), lumbosacral   7. Chronic pain syndrome    Plan of Care (Initial workup plan)  Note: Mr. Goodbar was reminded that as per protocol, today's visit has been an evaluation only. We have not taken over the patient's controlled substance management.  Patient's pain presentation is very complex.  Differential includes chronic lumbar radicular pain due to disc bulge at  L4-L5 that causes mild right greater than left subarticular narrowing without any appreciable nerve root impingement.  He has mild right neuroforaminal narrowing.  He also has lumbar spondylosis.  I have discussed physical therapy exercises that he can do at home.  Recommend trial of Lyrica as below.  Future considerations may include lumbar epidural steroid injection.  And/or lumbar facet medial branch nerve blocks.  Before doing so, I would like to obtain lab work as below.  Patient does have a history of vitamin B12 deficiency that could be contributing to his neuropathic pain symptoms.  If he is vitamin B12 deficient, I will recommend supplementation.  Complete urine toxicology screen to be considered for long-term opioid therapy.  Patient has not found benefit with conservative measures.  Lab Orders         Compliance Drug Analysis, Ur         Comp. Metabolic Panel (12)         Magnesium         Vitamin B12         25-Hydroxy vitamin D Lcms D2+D3       Pharmacotherapy (current): Medications ordered:  Meds ordered this encounter  Medications   pregabalin (LYRICA) 50 MG capsule    Sig: Take 1 capsule (50 mg total) by mouth 2 (two) times daily.    Dispense:  60 capsule    Refill:  1    Fill one day early if pharmacy is closed on scheduled refill date. May substitute for generic if available.   Medications administered during this visit: Wenda Low had no medications administered during this visit.   Pharmacological management options:  Opioid Analgesics: The patient was informed that there is no guarantee that he would be a candidate for opioid analgesics. The decision will be made following CDC guidelines. This decision will be based on the results of diagnostic studies, as well as Mr.  Lehtinen's risk profile.  Consider hydrocodone 5 mg 3 times daily as needed  Membrane stabilizer:  Limited response to gabapentin, trial of Lyrica as above.  Consider Cymbalta  Muscle  relaxant: To be determined at a later time  NSAID:  Do not recommend given history of vascular disease and aortobifemoral bypass  Other analgesic(s): To be determined at a later time   Interventional management options: Mr. Nasworthy was informed that there is no guarantee that he would be a candidate for interventional therapies. The decision will be based on the results of diagnostic studies, as well as Mr. Isadore's risk profile.  Procedure(s) under consideration:  Lumbar medial branch nerve blocks Lumbar epidural steroid injection Cervical epidural steroid injection    Provider-requested follow-up: Return in about 16 days (around 12/07/2021) for 2nd pt visit after UDS and labs.  I spent a total of 60 minutes reviewing chart data, face-to-face evaluation with the patient, counseling and coordination of care as detailed above.   Future Appointments  Date Time Provider [place]  12/12/2021  1:00 PM Douglas Santa, MD [facility] None     Note by: Douglas Santa, MD Date: 11/21/2021; Time: 3:32 PM

## 2021-11-21 NOTE — Progress Notes (Signed)
Safety precautions to be maintained throughout the outpatient stay will include: orient to surroundings, keep bed in low position, maintain call bell within reach at all times, provide assistance with transfer out of bed and ambulation.  

## 2021-11-24 LAB — COMPLIANCE DRUG ANALYSIS, UR

## 2021-11-27 LAB — COMP. METABOLIC PANEL (12)
AST: 27 IU/L (ref 0–40)
Albumin/Globulin Ratio: 2 (ref 1.2–2.2)
Albumin: 4.9 g/dL (ref 3.8–4.9)
Alkaline Phosphatase: 81 IU/L (ref 44–121)
BUN/Creatinine Ratio: 19 (ref 9–20)
BUN: 16 mg/dL (ref 6–24)
Bilirubin Total: 1.1 mg/dL (ref 0.0–1.2)
Calcium: 10.1 mg/dL (ref 8.7–10.2)
Chloride: 103 mmol/L (ref 96–106)
Creatinine, Ser: 0.84 mg/dL (ref 0.76–1.27)
Globulin, Total: 2.4 g/dL (ref 1.5–4.5)
Glucose: 99 mg/dL (ref 70–99)
Potassium: 4.5 mmol/L (ref 3.5–5.2)
Sodium: 142 mmol/L (ref 134–144)
Total Protein: 7.3 g/dL (ref 6.0–8.5)
eGFR: 102 mL/min/{1.73_m2} (ref >59)

## 2021-11-27 LAB — 25-HYDROXY VITAMIN D LCMS D2+D3
25-Hydroxy, Vitamin D-2: <1 ng/mL
25-Hydroxy, Vitamin D-3: 34 ng/mL
25-Hydroxy, Vitamin D: 34 ng/mL

## 2021-11-27 LAB — MAGNESIUM: Magnesium: 2.1 mg/dL (ref 1.6–2.3)

## 2021-11-27 LAB — VITAMIN B12: Vitamin B-12: 465 pg/mL (ref 232–1245)

## 2021-12-12 ENCOUNTER — Other Ambulatory Visit: Payer: Self-pay

## 2021-12-12 ENCOUNTER — Ambulatory Visit
Payer: 59 | Attending: Student in an Organized Health Care Education/Training Program | Admitting: Student in an Organized Health Care Education/Training Program

## 2021-12-12 ENCOUNTER — Encounter: Payer: Self-pay | Admitting: Student in an Organized Health Care Education/Training Program

## 2021-12-12 VITALS — BP 129/92 | HR 100 | Temp 98.2°F | Resp 18 | Ht 65.0 in | Wt 170.0 lb

## 2021-12-12 DIAGNOSIS — Z79891 Long term (current) use of opiate analgesic: Secondary | ICD-10-CM

## 2021-12-12 DIAGNOSIS — M792 Neuralgia and neuritis, unspecified: Secondary | ICD-10-CM | POA: Diagnosis present

## 2021-12-12 DIAGNOSIS — Z0289 Encounter for other administrative examinations: Secondary | ICD-10-CM | POA: Insufficient documentation

## 2021-12-12 DIAGNOSIS — I70229 Atherosclerosis of native arteries of extremities with rest pain, unspecified extremity: Secondary | ICD-10-CM | POA: Insufficient documentation

## 2021-12-12 DIAGNOSIS — I739 Peripheral vascular disease, unspecified: Secondary | ICD-10-CM

## 2021-12-12 DIAGNOSIS — G894 Chronic pain syndrome: Secondary | ICD-10-CM | POA: Diagnosis present

## 2021-12-12 DIAGNOSIS — M5416 Radiculopathy, lumbar region: Secondary | ICD-10-CM

## 2021-12-12 DIAGNOSIS — I70213 Atherosclerosis of native arteries of extremities with intermittent claudication, bilateral legs: Secondary | ICD-10-CM

## 2021-12-12 MED ORDER — HYDROCODONE-ACETAMINOPHEN 5-325 MG PO TABS: 1.0000 | ORAL_TABLET | Freq: Three times a day (TID) | ORAL | 0 refills | Status: DC | PRN

## 2021-12-12 NOTE — Patient Instructions (Signed)
Sign pain contract.

## 2021-12-12 NOTE — Progress Notes (Signed)
PROVIDER NOTE: Information contained herein reflects review and annotations entered in association with encounter. Interpretation of such information and data should be left to medically-trained personnel. Information provided to patient can be located elsewhere in the medical record under "Patient Instructions". Document created using STT-dictation technology, any transcriptional errors that may result from process are unintentional.    Patient: [name]  Service Category: E/M  Provider: Gillis Santa, MD  DOB: 26-Jun-1965  DOS: 12/12/2021  Referring Provider: Casilda Carls, MD  MRN: [id number]  Specialty: Interventional Pain Management  PCP: Casilda Carls, MD  Type: Established Patient  Setting: Ambulatory outpatient    Location: Office  Delivery: Face-to-face     Primary Reason(s) for Visit: Encounter for evaluation before starting new chronic pain management plan of care (Level of risk: moderate) CC: Back Pain ([name])  HPI  [name] is a 56 y.o. year old, male patient, who comes today for a follow-up evaluation to review the test results and decide on a treatment plan. He has Rectal pain; Encounter for screening colonoscopy; [name]; PAD (peripheral artery disease) ([place]); Chronic distal aortic occlusion ([place]); Leg pain; Hyperlipidemia; Ejaculatory disorder; Bilateral carpal tunnel syndrome; Right inguinal hernia; Atherosclerosis of native arteries of extremity with intermittent claudication (HCC); DJD (degenerative joint disease) of cervical spine; DJD (degenerative joint disease), lumbosacral; Lumbar radicular pain; Neuropathic pain; Atherosclerosis of native artery of both lower extremities with intermittent claudication ([place]); Pain management contract signed; Encounter for long-term opiate analgesic use; and Chronic pain syndrome on their problem list. His primarily concern today is the Back Pain ([name])  Pain Assessment: Location: Lower Back Radiating: posterior legs Onset:  More than a month ago Duration: Chronic pain Quality: Cramping, Pressure, Constant Severity: 10-Worst pain ever/10 (subjective, self-reported pain score)  Effect on ADL:   Timing: Constant Modifying factors: elevating legs, rest, heat BP: (!) 129/92  HR: 100  [name] comes in today for a follow-up visit after his initial evaluation on 11/21/2021. Today we went over the results of his tests. These were explained in "[place]man's terms". During today's appointment we went over my diagnostic impression, as well as the proposed treatment plan.  No change in his medical history No benefit with Lyrica as prescribed Reviewed lab work which is within normal limits. Initiation of hydrocodone, [name]-dose, 5 mg 3 times daily as needed.  Patient to sign pain contract.  UDS appropriate. Of note, he does have a left wrist splint in place for carpal tunnel syndrome.  He states that he has scheduled carpal tunnel release at the end of this month.  I provided him with a surgeons letter and also explained opioid prescribing postsurgery and how that is covered by the surgical team.  He is to continue taking his hydrocodone as prescribed and should not utilize that for postoperative pain management.   HPI from initial clinic visit: 11/21/2021 Patient is a pleasant 56 year old male who presents with a chief complaint of chronic [name] with radiation into bilateral legs.  This is been going on since 2018.  Of note he did have a aorto bilateral femoral bypass on 05/22/2017.  He states that after that surgery, he has not been able to walk as long as he could before.  He states that his [name] has worsened.  He also describes numbness and tingling in both of his feet.  He endorses spasms of posterior lateral thigh as well as his calves.  He has tried doing stretching exercises that have been recommended for  him in the past.  He has been evaluated by vascular surgery to see if occlusive symptoms could be  contributing to vascular claudication which does not seem to be the case although I think there is a component of this.  He also describes neck pain which she has had a cervical epidural steroid injection for.  His lumbar spine MRI is largely unremarkable.  Shows moderate degenerative disc disease at L1-L2.  He recently moved back from [place] a couple of years ago.  He does have mild lumbar spondylosis in his [name] back.  He further states that his feet stay cold again suggesting a vascular issue.  In regards to his cervical spine he does have a left foraminal disc protrusion at C6-C7 with moderate canal stenosis.  He states that he would like to hold off on injections at this time.  He is interested in medication management.  He states that he has found benefit with oxycodone in the past.  He has tried gabapentin in the past without any benefit.  This was increased to 300 mg 3 times daily and the patient states that he was taking slightly more to see if it would have an impact but he did not.  He denies having tried Lyrica.  He takes ibuprofen 4 to 800 mg as needed.  I cautioned him on high dose NSAID intake as it pertains to his vascular disease.  Chronic NSAID therapy can increase risk of adverse cardiovascular events.  He has difficulty completing ADLs.  Interpreter was present throughout the visit.     Of note, patient had severely [name] vitamin B12 levels in the past however he was not in town to receive the results.  I will recheck his vitamin B12 levels as that may explain his neurogenic pain.  I will recommend vitamin D supplementation if he is deficient in his vitamin B12 level.    Controlled Substance Pharmacotherapy Assessment REMS (Risk Evaluation and Mitigation Strategy)  Opioid Analgesic: Hydrocodone 5 mg every 8 hours as needed Pill Count: None expected due to no prior prescriptions written by our practice. Hart Rochester, RN  12/12/2021 12:53 PM  Signed Safety precautions to be maintained  throughout the outpatient stay will include: orient to surroundings, keep bed in [name] position, maintain call bell within reach at all times, provide assistance with transfer out of bed and ambulation.  Pharmacokinetics: Liberation and absorption (onset of action): WNL Distribution (time to peak effect): WNL Metabolism and excretion (duration of action): WNL         Pharmacodynamics: Desired effects: Analgesia: Mr. Elms reports >50% benefit. Functional ability: Patient reports that medication allows him to accomplish basic ADLs Clinically meaningful improvement in function (CMIF): Sustained CMIF goals met Perceived effectiveness: Described as relatively effective, allowing for increase in activities of daily living (ADL) Undesirable effects: Side-effects or Adverse reactions: None reported Monitoring: [place] PMP: PDMP not reviewed this encounter. Online review of the past 37-monthperiod previously conducted. Not applicable at this point since we have not taken over the patient's medication management yet. List of other Serum/Urine Drug Screening Test(s):  No results found for: "AMPHSCRSER", "BARBSCRSER", "BENZOSCRSER", "COCAINSCRSER", "COCAINSCRNUR", "PCPSCRSER", "THCSCRSER", "THCU", "CANNABQUANT", "OPIATESCRSER", "OXYSCRSER", "PROPOXSCRSER", "ETH", "CBDTHCR", "D8THCCBX", "D9THCCBX" List of all UDS test(s) done:  Lab Results  Component Value Date   SUMMARY Note 11/21/2021   Last UDS on record: Summary  Date Value Ref Range Status  11/21/2021 Note  Final    Comment:    ==================================================================== Compliance Drug Analysis, Ur ====================================================================  Test                             Result       Flag       Units  Drug Present and Declared for Prescription Verification   Acetaminophen                  PRESENT      EXPECTED   Propranolol                    PRESENT      EXPECTED  Drug Present not  Declared for Prescription Verification   Gabapentin                     PRESENT      UNEXPECTED  Drug Absent but Declared for Prescription Verification   Pregabalin                     Not Detected UNEXPECTED   Salicylate                     Not Detected UNEXPECTED    Aspirin, as indicated in the declared medication list, is not always    detected even when used as directed.    Ibuprofen                      Not Detected UNEXPECTED    Ibuprofen, as indicated in the declared medication list, is not    always detected even when used as directed.  ==================================================================== Test                      Result    Flag   Units      Ref Range   Creatinine              120              mg/dL      >=20 ==================================================================== Declared Medications:  The flagging and interpretation on this report are based on the  following declared medications.  Unexpected results may arise from  inaccuracies in the declared medications.   **Note: The testing scope of this panel includes these medications:   Pregabalin (Lyrica)  Propranolol (Inderal)   **Note: The testing scope of this panel does not include small to  moderate amounts of these reported medications:   Acetaminophen (Tylenol)  Aspirin  Ibuprofen (Advil)   **Note: The testing scope of this panel does not include the  following reported medications:   Rosuvastatin (Crestor) ==================================================================== For clinical consultation, please call [phone]. ====================================================================    UDS interpretation: No unexpected findings.          Medication Assessment Form: Not applicable. No opioids. Treatment compliance: Not applicable Risk Assessment Profile: Aberrant behavior: See initial evaluations. None observed or detected today Comorbid factors increasing risk of  overdose: See initial evaluation. No additional risks detected today Opioid risk tool (ORT):     11/21/2021    9:04 AM  Opioid Risk   Alcohol 0  Illegal Drugs 0  Rx Drugs 0  Alcohol 0  Illegal Drugs 0  Rx Drugs 0  Age between 16-45 years  0  History of Preadolescent Sexual Abuse 0  Psychological Disease 0  Depression 0  Opioid Risk Tool Scoring 0  Opioid Risk Interpretation [name] Risk    ORT  Scoring interpretation table:  Score <3 = [name] Risk for SUD  Score between 4-7 = Moderate Risk for SUD  Score >8 = High Risk for Opioid Abuse   Risk of substance use disorder (SUD): [name]  Risk Mitigation Strategies:  Patient opioid safety counseling: Completed today. Counseling provided to patient as per "Patient Counseling Document". Document signed by patient, attesting to counseling and understanding Patient-Prescriber Agreement (PPA): Obtained today.  Controlled substance notification to other providers: Written and sent today.  Pharmacologic Plan: Today we may be taking over the patient's pharmacological regimen. See below.             Laboratory Chemistry Profile   Renal Lab Results  Component Value Date   BUN 16 11/21/2021   CREATININE 0.84 11/21/2021   BCR 19 11/21/2021   GFRAA >60 05/27/2017   GFRNONAA >60 06/27/2021   PROTEINUR 30 (A) 06/20/2021     Electrolytes Lab Results  Component Value Date   NA 142 11/21/2021   K 4.5 11/21/2021   CL 103 11/21/2021   CALCIUM 10.1 11/21/2021   MG 2.1 11/21/2021   PHOS 3.9 05/28/2017     Hepatic Lab Results  Component Value Date   AST 27 11/21/2021   ALT 43 06/27/2021   ALBUMIN 4.9 11/21/2021   ALKPHOS 81 11/21/2021     ID Lab Results  Component Value Date   STAPHAUREUS NEGATIVE 05/21/2017   MRSAPCR NEGATIVE 05/21/2017     Bone Lab Results  Component Value Date   25OHVITD1 34 11/21/2021   25OHVITD2 <1.0 11/21/2021   25OHVITD3 34 11/21/2021     Endocrine Lab Results  Component Value Date   GLUCOSE 99  11/21/2021   GLUCOSEU NEGATIVE 06/20/2021     Neuropathy Lab Results  Component Value Date   KYHCWCBJ62 831 11/21/2021     CNS No results found for: "COLORCSF", "APPEARCSF", "RBCCOUNTCSF", "WBCCSF", "POLYSCSF", "LYMPHSCSF", "EOSCSF", "PROTEINCSF", "GLUCCSF", "JCVIRUS", "CSFOLI", "IGGCSF", "LABACHR", "ACETBL"   Inflammation (CRP: Acute  ESR: Chronic) No results found for: "CRP", "ESRSEDRATE", "LATICACIDVEN"   Rheumatology No results found for: "RF", "ANA", "LABURIC", "URICUR", "LYMEIGGIGMAB", "LYMEABIGMQN", "HLAB27"   Coagulation Lab Results  Component Value Date   INR 1.04 05/21/2017   LABPROT 13.5 05/21/2017   APTT 31 05/21/2017   PLT 241 06/27/2021     Cardiovascular Lab Results  Component Value Date   HGB 15.4 06/27/2021   HCT 46.1 06/27/2021     Screening Lab Results  Component Value Date   STAPHAUREUS NEGATIVE 05/21/2017   MRSAPCR NEGATIVE 05/21/2017     Cancer No results found for: "CEA", "CA125", "LABCA2"   Allergens No results found for: "ALMOND", "APPLE", "ASPARAGUS", "AVOCADO", "BANANA", "BARLEY", "BASIL", "BAYLEAF", "GREENBEAN", "LIMABEAN", "WHITEBEAN", "BEEFIGE", "REDBEET", "BLUEBERRY", "BROCCOLI", "CABBAGE", "MELON", "CARROT", "CASEIN", "CASHEWNUT", "CAULIFLOWER", "CELERY"     Note: Lab results reviewed.  Recent Diagnostic Imaging Review   Cervical Imaging: Cervical MR wo contrast: Results for orders placed during the hospital encounter of 07/20/21   MR CERVICAL SPINE WO CONTRAST   Narrative CLINICAL DATA:  Left neck pain radiating down the left arm with weakness.   EXAM: MRI CERVICAL SPINE WITHOUT CONTRAST   TECHNIQUE: Multiplanar, multisequence MR imaging of the cervical spine was performed. No intravenous contrast was administered.   COMPARISON:  Cervical spine radiographs 06/13/2021   FINDINGS: Alignment: Normal.   Vertebrae: No fracture, suspicious marrow lesion, or significant marrow edema.   Cord: Normal signal.    Posterior Fossa, vertebral arteries, paraspinal tissues: Unremarkable.   Disc levels:  C2-3: Negative.   C3-4: A small central disc protrusion in moderate spinal stenosis, mildly indenting the ventral spinal cord. There is mild disc bulging and uncovertebral spurring without significant neural foraminal stenosis.   C4-5: Disc bulging and uncovertebral spurring result in mild spinal stenosis and mild right neural foraminal stenosis.   C5-6: Moderate disc space narrowing. Disc bulging and uncovertebral spurring result in mild spinal stenosis and moderate right and mild-to-moderate left neural foraminal stenosis.   C6-7: A small central disc protrusion slightly indents the ventral spinal cord without significant spinal stenosis. Disc bulging, uncovertebral spurring, and a left foraminal disc protrusion result in moderate left neural foraminal stenosis with potential left C7 nerve root impingement.   C7-T1: Mild facet arthrosis without stenosis.   IMPRESSION: 1. Left foraminal disc protrusion at C6-7 with moderate left neural foraminal stenosis and potential left C7 nerve root impingement. 2. Moderate spinal stenosis at C3-4. 3. Mild spinal stenosis at C4-5 and C5-6.     Electronically Signed By: Logan Bores M.D. On: 07/20/2021 12:54     Narrative CLINICAL DATA:  56 year old male with posterior neck pain for many years.   EXAM: CERVICAL SPINE - 2-3 VIEW   COMPARISON:  None.   FINDINGS: Normal prevertebral soft tissue contour. Mild straightening of cervical lordosis. Cervicothoracic junction alignment is within normal limits. Normal cervical AP alignment. Normal C1-C2 alignment and joint spaces. Mild to moderate disc space loss with mild endplate spurring at Q0-G8. Mild adjacent disc space loss. Cervical spinous processes appear intact and unremarkable. No acute osseous abnormality identified. Negative visible upper chest.   IMPRESSION: C5-C6 disc and endplate  degeneration. No acute osseous abnormality identified in the cervical spine.     Electronically Signed By: Genevie Ann M.D. On: 06/14/2021 08:40   Lumbosacral Imaging: Lumbar MR wo contrast: Results for orders placed during the hospital encounter of 08/30/21   MR LUMBAR SPINE WO CONTRAST   Narrative CLINICAL DATA:  Lumbar radiculopathy. Chronic left-sided [name] with bilateral sciatica. Additional history provided by scanning technologist: Patient reports worsening chronic [name]. Pain in central and left greater than right [name] back. Buttock, hip and leg pain.   EXAM: MRI LUMBAR SPINE WITHOUT CONTRAST   TECHNIQUE: Multiplanar, multisequence MR imaging of the lumbar spine was performed. No intravenous contrast was administered.   COMPARISON:  CTA abdomen/pelvis of 04/26/2017.   FINDINGS: Segmentation: 5 lumbar vertebrae. The caudal most well-formed intervertebral disc space is designated L5-S1.   Alignment: 2 mm L1-L2 grade 1 retrolisthesis. T trace grade 1 retrolisthesis at L4-L5 and L5-S1. Race L5-S1 grade 1 retrolisthesis.   Vertebrae: No lumbar vertebral compression fracture. No significant marrow edema or focal suspicious osseous lesion.   Conus medullaris and cauda equina: Conus extends to the L2 level. No signal abnormality within the visualized distal spinal cord.   Paraspinal and other soft tissues: Left renal sinus cysts. No paraspinal mass or collection.   Disc levels:   Mild-to-moderate disc degeneration at L1-L2. No more than mild disc degeneration at the remaining levels.   T12-L1: Moderate facet arthrosis. No significant disc herniation or stenosis.   L1-L2: 2 mm grade 1 retrolisthesis. Disc bulge. Mild facet arthrosis. Minimal effacement of the ventral thecal sac. No significant foraminal stenosis.   L2-L3: Mild facet arthrosis. No significant disc herniation or stenosis.   L3-L4: Small disc bulge. Moderate facet arthrosis.  Ligamentum flavum hypertrophy on the left. Mild relative left subarticular narrowing (without nerve root impingement). Central canal patent. Mild  left inferior neural foraminal narrowing.   L4-L5: Trace grade 1 retrolisthesis. Disc bulge with endplate spurring. Central posterior annular fissure. Mild facet arthrosis. Mild right greater than left subarticular narrowing (without appreciable nerve root impingement) (series 8, image 29). Minimal relative narrowing of the central canal. Mild right neural foraminal narrowing.   L5-S1: Trace grade 1 retrolisthesis. Trace grade 1 retrolisthesis. Small disc bulge. Central posterior annular fissure. Mild facet arthrosis. No significant spinal canal stenosis or neural foraminal narrowing.   IMPRESSION: Lumbar spondylosis, as outlined.   Mild subarticular narrowing on the left at L3-L4 and bilaterally at L4-L5. There is also minimal relative narrowing of the central canal at L4-L5.   Mild neural foraminal narrowing on the left at L3-L4 and on the right at L4-L5.   Disc degeneration is greatest at L1-L2 (mild-to-moderate at this level).   Mild grade 1 retrolisthesis at L1-L2, L4-L5 and L5-S1.     Electronically Signed By: Kellie Simmering D.O. On: 08/31/2021 08:51 DG ELBOW COMPLETE LEFT (3+VIEW)   Narrative CLINICAL DATA:  Left posterior/lateral elbow pain for many years.   EXAM: LEFT ELBOW - COMPLETE 3+ VIEW   COMPARISON:  None.   FINDINGS: There is no evidence of fracture, dislocation, or joint effusion. There is no evidence of arthropathy or other focal bone abnormality. Soft tissues are unremarkable.   IMPRESSION: Negative.     Electronically Signed By: Macy Mis M.D. On: 06/14/2021 08:40       Complexity Note: Imaging results reviewed.                Meds   Current Outpatient Medications:    acetaminophen (TYLENOL) 325 MG tablet, Take 650 mg by mouth daily as needed for moderate pain., Disp: , Rfl:     aspirin EC 81 MG tablet, Take 81 mg by mouth daily. Swallow whole., Disp: , Rfl:    HYDROcodone-acetaminophen (NORCO/VICODIN) 5-325 MG tablet, Take 1 tablet by mouth every 8 (eight) hours as needed for severe pain. Must last 30 days., Disp: 90 tablet, Rfl: 0   ibuprofen (ADVIL) 800 MG tablet, Take 1 tablet (800 mg total) by mouth every 8 (eight) hours as needed., Disp: 30 tablet, Rfl: 0   pregabalin (LYRICA) 50 MG capsule, Take 1 capsule (50 mg total) by mouth 2 (two) times daily., Disp: 60 capsule, Rfl: 1   propranolol (INDERAL) 10 MG tablet, Take 10 mg by mouth 3 (three) times daily., Disp: , Rfl:    rosuvastatin (CRESTOR) 20 MG tablet, Take 20 mg by mouth daily., Disp: , Rfl:   ROS  Constitutional: Denies any fever or chills Gastrointestinal: No reported hemesis, hematochezia, vomiting, or acute GI distress Musculoskeletal:  [name], left carpal tunnel pain Neurological: No reported episodes of acute onset apraxia, aphasia, dysarthria, agnosia, amnesia, paralysis, loss of coordination, or loss of consciousness  Allergies  Mr. Vaile has No Known Allergies.  [place]  Drug: Mr. Vreeland  reports no history of drug use. Alcohol:  reports no history of alcohol use. Tobacco:  reports that he has been smoking cigarettes. He has a 7.50 pack-year smoking history. He has been exposed to tobacco smoke. He has never used smokeless tobacco. Medical:  has a past medical history of History of kidney stones, Leg pain, [name] (04/04/2017), Patient denies medical problems, and Peripheral vascular disease ([place]). Surgical: Mr. Woodin  has a past surgical history that includes Knee surgery (2006); Colonoscopy with propofol (N/A, 03/27/2017); Kidney stone surgery; Aorta - bilateral femoral artery  bypass graft (N/A, 05/22/2017); Central venous catheter insertion (Left, 05/22/2017); Robotic assisted laparoscopic lysis of adhesion (N/A, 06/28/2021); and Insertion of mesh (Right,  06/28/2021). Family: family history includes Alzheimer's disease in his father; Diabetes in his mother; Heart disease in his mother.  Constitutional Exam  General appearance: Well nourished, well developed, and well hydrated. In no apparent acute distress Vitals:   12/12/21 1248  BP: (!) 129/92  Pulse: 100  Resp: 18  Temp: 98.2 F (36.8 C)  TempSrc: Temporal  SpO2: 100%  Weight: 170 lb (77.1 kg)  Height: 5' 5" (1.651 m)   BMI Assessment: Estimated body mass index is 28.29 kg/m as calculated from the following:   Height as of this encounter: 5' 5" (1.651 m).   Weight as of this encounter: 170 lb (77.1 kg).  BMI interpretation table: BMI level Category Range association with higher incidence of chronic pain  <18 kg/m2 Underweight   18.5-24.9 kg/m2 Ideal body weight   25-29.9 kg/m2 Overweight Increased incidence by 20%  30-34.9 kg/m2 Obese (Class I) Increased incidence by 68%  35-39.9 kg/m2 Severe obesity (Class II) Increased incidence by 136%  >40 kg/m2 Extreme obesity (Class III) Increased incidence by 254%   Patient's current BMI Ideal Body weight  Body mass index is 28.29 kg/m. Ideal body weight: 61.5 kg (135 lb 9.3 oz) Adjusted ideal body weight: 67.7 kg (149 lb 5.6 oz)   BMI Readings from Last 4 Encounters:  12/12/21 28.29 kg/m  11/21/21 29.42 kg/m  09/12/21 28.42 kg/m  07/20/21 26.46 kg/m   Wt Readings from Last 4 Encounters:  12/12/21 170 lb (77.1 kg)  11/21/21 176 lb 12.8 oz (80.2 kg)  09/12/21 170 lb 12.8 oz (77.5 kg)  07/20/21 159 lb (72.1 kg)    Psych/Mental status: Alert, oriented x 3 (person, place, & time)       Eyes: PERLA Respiratory: No evidence of acute respiratory distress  Assessment & Plan  Primary Diagnosis & Pertinent Problem List: The primary encounter diagnosis was Lumbar radicular pain. Diagnoses of Neuropathic pain, PAD (peripheral artery disease) ([place]), Atherosclerosis of native artery of both lower extremities with intermittent  claudication [facility]), Pain management contract signed, Encounter for long-term opiate analgesic use, and Chronic pain syndrome were also pertinent to this visit.  Visit Diagnosis: 1. Lumbar radicular pain   2. Neuropathic pain   3. PAD (peripheral artery disease) ([place])   4. Atherosclerosis of native artery of both lower extremities with intermittent claudication (HCC)   5. Pain management contract signed   6. Encounter for long-term opiate analgesic use   7. Chronic pain syndrome    Problems updated and reviewed during this visit: Problem  Lumbar Radicular Pain  Neuropathic Pain  Atherosclerosis of Native Artery of Both Lower Extremities With Intermittent Claudication (Hcc)  Pain Management Contract Signed  Encounter for Long-Term Opiate Analgesic Use  Chronic Pain Syndrome    Plan of Care  1. Lumbar radicular pain - HYDROcodone-acetaminophen (NORCO/VICODIN) 5-325 MG tablet; Take 1 tablet by mouth every 8 (eight) hours as needed for severe pain. Must last 30 days.  Dispense: 90 tablet; Refill: 0  2. Neuropathic pain - HYDROcodone-acetaminophen (NORCO/VICODIN) 5-325 MG tablet; Take 1 tablet by mouth every 8 (eight) hours as needed for severe pain. Must last 30 days.  Dispense: 90 tablet; Refill: 0  3. PAD (peripheral artery disease) (HCC) - HYDROcodone-acetaminophen (NORCO/VICODIN) 5-325 MG tablet; Take 1 tablet by mouth every 8 (eight) hours as needed for severe pain. Must last 30 days.  Dispense:  90 tablet; Refill: 0  4. Atherosclerosis of native artery of both lower extremities with intermittent claudication (HCC) - HYDROcodone-acetaminophen (NORCO/VICODIN) 5-325 MG tablet; Take 1 tablet by mouth every 8 (eight) hours as needed for severe pain. Must last 30 days.  Dispense: 90 tablet; Refill: 0  5. Pain management contract signed - HYDROcodone-acetaminophen (NORCO/VICODIN) 5-325 MG tablet; Take 1 tablet by mouth every 8 (eight) hours as needed for severe pain. Must last 30 days.   Dispense: 90 tablet; Refill: 0  6. Encounter for long-term opiate analgesic use - HYDROcodone-acetaminophen (NORCO/VICODIN) 5-325 MG tablet; Take 1 tablet by mouth every 8 (eight) hours as needed for severe pain. Must last 30 days.  Dispense: 90 tablet; Refill: 0  7. Chronic pain syndrome - HYDROcodone-acetaminophen (NORCO/VICODIN) 5-325 MG tablet; Take 1 tablet by mouth every 8 (eight) hours as needed for severe pain. Must last 30 days.  Dispense: 90 tablet; Refill: 0      Pharmacological management:  Opioid Analgesics: I will not be prescribing any opioids at this time Membrane stabilizer: Tried and failed gabapentin, Lyrica, amitriptyline Muscle relaxant:  Flexeril, tizanidine NSAID: Tried and failed, do not recommend chronic NSAIDs in the context of his cardiovascular disease Other analgesic(s): I will not be prescribing any at this time    Provider-requested follow-up: Return in about 4 weeks (around 01/09/2022) for Medication Management, in person. Recent Visits Date Type Provider Dept  11/21/21 Office Visit Gillis Santa, MD [facility]  Showing recent visits within past 90 days and meeting all other requirements Today's Visits Date Type Provider Dept  12/12/21 Office Visit Gillis Santa, MD [facility]  Showing today's visits and meeting all other requirements Future Appointments Date Type Provider Dept  01/04/22 Appointment Gillis Santa, MD [facility]  Showing future appointments within next 90 days and meeting all other requirements  Primary Care Physician: Casilda Carls, MD Note by: Gillis Santa, MD Date: 12/12/2021; Time: 2:20 PM

## 2021-12-12 NOTE — Progress Notes (Signed)
Safety precautions to be maintained throughout the outpatient stay will include: orient to surroundings, keep bed in low position, maintain call bell within reach at all times, provide assistance with transfer out of bed and ambulation.  

## 2021-12-13 ENCOUNTER — Telehealth: Payer: Self-pay | Admitting: Student in an Organized Health Care Education/Training Program

## 2021-12-13 NOTE — Telephone Encounter (Signed)
Hydrocodone approved, patient notified.

## 2021-12-13 NOTE — Telephone Encounter (Signed)
PT stated that pharmacy needs pre autho in order to fill prescription. PT will like to be called when the autho has been send in. Thanks

## 2021-12-14 ENCOUNTER — Other Ambulatory Visit: Payer: Self-pay | Admitting: Surgery

## 2021-12-19 ENCOUNTER — Inpatient Hospital Stay: Admission: RE | Admit: 2021-12-19 | Payer: 59 | Source: Ambulatory Visit

## 2022-01-01 ENCOUNTER — Encounter (INDEPENDENT_AMBULATORY_CARE_PROVIDER_SITE_OTHER): Payer: Self-pay

## 2022-01-04 ENCOUNTER — Ambulatory Visit
Payer: 59 | Attending: Student in an Organized Health Care Education/Training Program | Admitting: Student in an Organized Health Care Education/Training Program

## 2022-01-04 ENCOUNTER — Encounter: Payer: Self-pay | Admitting: Student in an Organized Health Care Education/Training Program

## 2022-01-04 VITALS — BP 122/76 | HR 82 | Temp 99.1°F | Resp 16 | Ht 65.0 in | Wt 173.0 lb

## 2022-01-04 DIAGNOSIS — M792 Neuralgia and neuritis, unspecified: Secondary | ICD-10-CM | POA: Insufficient documentation

## 2022-01-04 DIAGNOSIS — I70213 Atherosclerosis of native arteries of extremities with intermittent claudication, bilateral legs: Secondary | ICD-10-CM | POA: Insufficient documentation

## 2022-01-04 DIAGNOSIS — Z95828 Presence of other vascular implants and grafts: Secondary | ICD-10-CM | POA: Diagnosis present

## 2022-01-04 DIAGNOSIS — G894 Chronic pain syndrome: Secondary | ICD-10-CM | POA: Diagnosis present

## 2022-01-04 DIAGNOSIS — M5416 Radiculopathy, lumbar region: Secondary | ICD-10-CM | POA: Insufficient documentation

## 2022-01-04 DIAGNOSIS — Z79891 Long term (current) use of opiate analgesic: Secondary | ICD-10-CM | POA: Diagnosis present

## 2022-01-04 DIAGNOSIS — I739 Peripheral vascular disease, unspecified: Secondary | ICD-10-CM | POA: Diagnosis not present

## 2022-01-04 DIAGNOSIS — Z0289 Encounter for other administrative examinations: Secondary | ICD-10-CM | POA: Diagnosis present

## 2022-01-04 DIAGNOSIS — I70219 Atherosclerosis of native arteries of extremities with intermittent claudication, unspecified extremity: Secondary | ICD-10-CM | POA: Insufficient documentation

## 2022-01-04 MED ORDER — HYDROCODONE-ACETAMINOPHEN 5-325 MG PO TABS: 1.0000 | ORAL_TABLET | Freq: Four times a day (QID) | ORAL | 0 refills | Status: DC | PRN

## 2022-01-04 NOTE — Progress Notes (Signed)
PROVIDER NOTE: Information contained herein reflects review and annotations entered in association with encounter. Interpretation of such information and data should be left to medically-trained personnel. Information provided to patient can be located elsewhere in the medical record under "Patient Instructions". Document created using STT-dictation technology, any transcriptional errors that may result from process are unintentional.    Patient: [name]  Service Category: E/M  Provider: Gillis Santa, MD  DOB: 1965/12/27  DOS: 01/04/2022  Referring Provider: Casilda Carls, MD  MRN: [id number]  Specialty: Interventional Pain Management  PCP: Casilda Carls, MD  Type: Established Patient  Setting: Ambulatory outpatient    Location: Office  Delivery: Face-to-face     HPI  [name], a 56 y.o. year old male, is here today because of his Atherosclerotic peripheral vascular disease with intermittent claudication ([place]) [I70.219]. [name] primary complain today is Back Pain (lower) and Hand Pain (Left- has carpal tunnel and will be having surgery on 01/17/22) Last encounter: My last encounter with him was on 12/13/2021. Pertinent problems: [name] has Chronic distal aortic occlusion ([place]); Neuropathic pain; Atherosclerosis of native artery of both lower extremities with intermittent claudication ([place]); Pain management contract signed; Encounter for long-term opiate analgesic use; and Chronic pain syndrome on their pertinent problem list. Pain Assessment: Severity of Chronic pain is reported as a 7 /10. Location: Back Lower/buttocks bilateral down back of both legs to calve, left side is worse. Onset: More than a month ago. Quality: Aching, Burning, Discomfort, Cramping. Timing: Constant. Modifying factor(s): medication, heat,. Vitals:  height is _0  (1.651 m) and weight is 173 lb (78.5 kg). His temperature is 99.1 F (37.3 C). His blood pressure is 122/76 and his pulse  is 82. His respiration is 16 and oxygen saturation is 100%.   Reason for encounter: medication management.  As well as bilateral leg pain worse with walking and exertion.  Patient presents today for medication management.  He is noticing mild analgesic and functional benefit with hydrocodone 5 mg every 8 hours as needed.  He states that his pain is severe in the evenings and he would like an extra dose that he can take for when that happens.  He is also complaining of increased lower extremity pain and weakness associated with activity and exertion consistent with vascular claudication.  Less likely that this is coming from a spinal source as the patient has had a lumbar MRI which does not show any significant central canal stenosis.  Overall his lumbar MRI shows mild neuroforaminal narrowing at left L3-4 and on the right at L4-L5 with disc degeneration at L1-L2.  Patient is requesting a repeat CT angiogram as he is convinced that his pain is coming from a vascular cause.  I also agree that this is higher on the differential than a spinal issue.  Especially in the context of worsening pain with exertion and aortic bypass  He has a a procedure next week for left carpal tunnel release.  He is under a lot of stress and is having difficulty sleeping at night.  Pharmacotherapy Assessment  Analgesic: Hydrocodone 5 mg every 6 hours as needed, quantity 120/month, MME equals 20  Monitoring: [place] PMP: PDMP reviewed during this encounter.       Pharmacotherapy: No side-effects or adverse reactions reported. Compliance: No problems identified. Effectiveness: Clinically acceptable.  Ignatius Specking, RN  01/04/2022  2:05 PM  Sign when Signing Visit Safety precautions to be maintained throughout the outpatient stay will include: orient to surroundings, keep  bed in [name] position, maintain call bell within reach at all times, provide assistance with transfer out of bed and ambulation.   Did not bring medication  with him today. Stated that he did not know to. Instructed him to bring every appointment even if bottle is empty.  Patient with understanding.  States that he has about 10 pills at home.    No results found for: "CBDTHCR" No results found for: "D8THCCBX" No results found for: "D9THCCBX"  UDS:  Summary  Date Value Ref Range Status  11/21/2021 Note  Final    Comment:    ==================================================================== Compliance Drug Analysis, Ur ==================================================================== Test                             Result       Flag       Units  Drug Present and Declared for Prescription Verification   Acetaminophen                  PRESENT      EXPECTED   Propranolol                    PRESENT      EXPECTED  Drug Present not Declared for Prescription Verification   Gabapentin                     PRESENT      UNEXPECTED  Drug Absent but Declared for Prescription Verification   Pregabalin                     Not Detected UNEXPECTED   Salicylate                     Not Detected UNEXPECTED    Aspirin, as indicated in the declared medication list, is not always    detected even when used as directed.    Ibuprofen                      Not Detected UNEXPECTED    Ibuprofen, as indicated in the declared medication list, is not    always detected even when used as directed.  ==================================================================== Test                      Result    Flag   Units      Ref Range   Creatinine              120              mg/dL      >=20 ==================================================================== Declared Medications:  The flagging and interpretation on this report are based on the  following declared medications.  Unexpected results may arise from  inaccuracies in the declared medications.   **Note: The testing scope of this panel includes these medications:   Pregabalin (Lyrica)  Propranolol  (Inderal)   **Note: The testing scope of this panel does not include small to  moderate amounts of these reported medications:   Acetaminophen (Tylenol)  Aspirin  Ibuprofen (Advil)   **Note: The testing scope of this panel does not include the  following reported medications:   Rosuvastatin (Crestor) ==================================================================== For clinical consultation, please call [phone]. ====================================================================       ROS  Constitutional: Denies any fever or chills Gastrointestinal: No reported hemesis, hematochezia, vomiting, or acute GI distress  Musculoskeletal:  Bilateral lower extremity pain, worse with walking and exertion Neurological: No reported episodes of acute onset apraxia, aphasia, dysarthria, agnosia, amnesia, paralysis, loss of coordination, or loss of consciousness  Medication Review  HYDROcodone-acetaminophen, acetaminophen, aspirin EC, ibuprofen, pregabalin, propranolol, and rosuvastatin  History Review  Allergy: [name] has No Known Allergies. Drug: [name]  reports no history of drug use. Alcohol:  reports no history of alcohol use. Tobacco:  reports that he has been smoking cigarettes. He has a 7.50 pack-year smoking history. He has been exposed to tobacco smoke. He has never used smokeless tobacco. Social: [name]  reports that he has been smoking cigarettes. He has a 7.50 pack-year smoking history. He has been exposed to tobacco smoke. He has never used smokeless tobacco. He reports that he does not drink alcohol and does not use drugs. Medical:  has a past medical history of History of kidney stones, Leg pain, [name] (04/04/2017), Patient denies medical problems, and Peripheral vascular disease ([place]). Surgical: [name]  has a past surgical history that includes Knee surgery (2006); Colonoscopy with propofol (N/A, 03/27/2017); Kidney stone  surgery; Aorta - bilateral femoral artery bypass graft (N/A, 05/22/2017); Central venous catheter insertion (Left, 05/22/2017); Robotic assisted laparoscopic lysis of adhesion (N/A, 06/28/2021); and Insertion of mesh (Right, 06/28/2021). Family: family history includes Alzheimer's disease in his father; Diabetes in his mother; Heart disease in his mother.  Laboratory Chemistry Profile   Renal Lab Results  Component Value Date   BUN 16 11/21/2021   CREATININE 0.84 11/21/2021   BCR 19 11/21/2021   GFRAA >60 05/27/2017   GFRNONAA >60 06/27/2021    Hepatic Lab Results  Component Value Date   AST 27 11/21/2021   ALT 43 06/27/2021   ALBUMIN 4.9 11/21/2021   ALKPHOS 81 11/21/2021    Electrolytes Lab Results  Component Value Date   NA 142 11/21/2021   K 4.5 11/21/2021   CL 103 11/21/2021   CALCIUM 10.1 11/21/2021   MG 2.1 11/21/2021   PHOS 3.9 05/28/2017    Bone Lab Results  Component Value Date   25OHVITD1 34 11/21/2021   25OHVITD2 <1.0 11/21/2021   25OHVITD3 34 11/21/2021    Inflammation (CRP: Acute Phase) (ESR: Chronic Phase) No results found for: "CRP", "ESRSEDRATE", "LATICACIDVEN"       Note: Above Lab results reviewed.  Recent Imaging Review  MR LUMBAR SPINE WO CONTRAST CLINICAL DATA:  Lumbar radiculopathy. Chronic left-sided [name] with bilateral sciatica. Additional history provided by scanning technologist: Patient reports worsening chronic [name]. Pain in central and left greater than right [name] back. Buttock, hip and leg pain.  EXAM: MRI LUMBAR SPINE WITHOUT CONTRAST  TECHNIQUE: Multiplanar, multisequence MR imaging of the lumbar spine was performed. No intravenous contrast was administered.  COMPARISON:  CTA abdomen/pelvis of 04/26/2017.  FINDINGS: Segmentation: 5 lumbar vertebrae. The caudal most well-formed intervertebral disc space is designated L5-S1.  Alignment: 2 mm L1-L2 grade 1 retrolisthesis. T trace grade 1 retrolisthesis at  L4-L5 and L5-S1. Race L5-S1 grade 1 retrolisthesis.  Vertebrae: No lumbar vertebral compression fracture. No significant marrow edema or focal suspicious osseous lesion.  Conus medullaris and cauda equina: Conus extends to the L2 level. No signal abnormality within the visualized distal spinal cord.  Paraspinal and other soft tissues: Left renal sinus cysts. No paraspinal mass or collection.  Disc levels:  Mild-to-moderate disc degeneration at L1-L2. No more than mild disc degeneration at the remaining levels.  T12-L1: Moderate facet  arthrosis. No significant disc herniation or stenosis.  L1-L2: 2 mm grade 1 retrolisthesis. Disc bulge. Mild facet arthrosis. Minimal effacement of the ventral thecal sac. No significant foraminal stenosis.  L2-L3: Mild facet arthrosis. No significant disc herniation or stenosis.  L3-L4: Small disc bulge. Moderate facet arthrosis. Ligamentum flavum hypertrophy on the left. Mild relative left subarticular narrowing (without nerve root impingement). Central canal patent. Mild left inferior neural foraminal narrowing.  L4-L5: Trace grade 1 retrolisthesis. Disc bulge with endplate spurring. Central posterior annular fissure. Mild facet arthrosis. Mild right greater than left subarticular narrowing (without appreciable nerve root impingement) (series 8, image 29). Minimal relative narrowing of the central canal. Mild right neural foraminal narrowing.  L5-S1: Trace grade 1 retrolisthesis. Trace grade 1 retrolisthesis. Small disc bulge. Central posterior annular fissure. Mild facet arthrosis. No significant spinal canal stenosis or neural foraminal narrowing.  IMPRESSION: Lumbar spondylosis, as outlined.  Mild subarticular narrowing on the left at L3-L4 and bilaterally at L4-L5. There is also minimal relative narrowing of the central canal at L4-L5.  Mild neural foraminal narrowing on the left at L3-L4 and on the right at L4-L5.  Disc  degeneration is greatest at L1-L2 (mild-to-moderate at this level).  Mild grade 1 retrolisthesis at L1-L2, L4-L5 and L5-S1.  Electronically Signed   By: Kellie Simmering D.O.   On: 08/31/2021 08:51 Note: Reviewed        Physical Exam  General appearance: Well nourished, well developed, and well hydrated. In no apparent acute distress Mental status: Alert, oriented x 3 (person, place, & time)       Respiratory: No evidence of acute respiratory distress Eyes: PERLA Vitals: BP 122/76   Pulse 82   Temp 99.1 F (37.3 C)   Resp 16   Ht _0  (1.651 m)   Wt 173 lb (78.5 kg)   SpO2 100%   BMI 28.79 kg/m  BMI: Estimated body mass index is 28.79 kg/m as calculated from the following:   Height as of this encounter: _1  (1.651 m).   Weight as of this encounter: 173 lb (78.5 kg). Ideal: Ideal body weight: 61.5 kg (135 lb 9.3 oz) Adjusted ideal body weight: 68.3 kg (150 lb 8.8 oz)  Left wrist brace in place  Large abdominal incision slightly tender to palpation   Lumbar Spine Area Exam  Skin & Axial Inspection: No masses, redness, or swelling Alignment: Symmetrical Functional ROM: Pain restricted ROM       Stability: No instability detected Muscle Tone/Strength: Functionally intact. No obvious neuro-muscular anomalies detected. Sensory (Neurological): Musculoskeletal pain pattern Palpation: No palpable anomalies       Provocative Tests: Hyperextension/rotation test: (+) bilaterally for facet joint pain. Lumbar quadrant test (Kemp's test): (+) bilaterally for facet joint pain.   Gait & Posture Assessment  Ambulation: Unassisted Gait: Relatively normal for age and body habitus Posture: WNL    Lower Extremity Exam      Side: Right lower extremity   Side: Left lower extremity  Stability: No instability observed           Stability: No instability observed          Skin & Extremity Inspection: Skin color, temperature, and hair growth are WNL. No peripheral edema or cyanosis. No  masses, redness, swelling, asymmetry, or associated skin lesions. No contractures.   Skin & Extremity Inspection: Skin color, temperature, and hair growth are WNL. No peripheral edema or cyanosis. No masses, redness, swelling, asymmetry, or associated skin lesions. No contractures.  Functional ROM: Unrestricted ROM                   Functional ROM: Unrestricted ROM                  Muscle Tone/Strength: Functionally intact. No obvious neuro-muscular anomalies detected.   Muscle Tone/Strength: Functionally intact. No obvious neuro-muscular anomalies detected.  Sensory (Neurological): Neurogenic pain pattern         Sensory (Neurological): Neurogenic pain pattern        DTR: Patellar: deferred today Achilles: deferred today Plantar: deferred today   DTR: Patellar: deferred today Achilles: deferred today Plantar: deferred today  Palpation: No palpable anomalies   Palpation: No palpable anomalies    5 out of 5 strength bilateral lower extremity: Plantar flexion, dorsiflexion, knee flexion, knee extension.   Assessment   Diagnosis Status  1. Atherosclerotic peripheral vascular disease with intermittent claudication ([place])   2. Neuropathic pain   3. PAD (peripheral artery disease) ([place])   4. Atherosclerosis of native artery of both lower extremities with intermittent claudication ([place])   5. Lumbar radicular pain   6. Pain management contract signed   7. Encounter for long-term opiate analgesic use   8. Chronic pain syndrome   9. Status post aortic bifurcation bypass graft    Worsening Persistent Worsening     Plan of Care    [name] has a current medication list which includes the following long-term medication(s): pregabalin, propranolol, and rosuvastatin.  Pharmacotherapy (Medications Ordered): Meds ordered this encounter  Medications   HYDROcodone-acetaminophen (NORCO/VICODIN) 5-325 MG tablet    Sig: Take 1 tablet by mouth every 6 (six) hours as needed for  severe pain. Must last 30 days.    Dispense:  120 tablet    Refill:  0    Chronic Pain: STOP Act (Not applicable) Fill 1 day early if closed on refill date. Avoid benzodiazepines within 8 hours of opioids   HYDROcodone-acetaminophen (NORCO/VICODIN) 5-325 MG tablet    Sig: Take 1 tablet by mouth every 6 (six) hours as needed for severe pain. Must last 30 days.    Dispense:  120 tablet    Refill:  0    Chronic Pain: STOP Act (Not applicable) Fill 1 day early if closed on refill date. Avoid benzodiazepines within 8 hours of opioids   Orders:  Orders Placed This Encounter  Procedures   CT ANGIO ABDOMEN W &/OR WO CONTRAST    Standing Status:   Future    Standing Expiration Date:   01/05/2023    Order Specific Question:   If indicated for the ordered procedure, I authorize the administration of contrast media per Radiology protocol    Answer:   Yes    Order Specific Question:   Preferred imaging location?    Answer[facility]    [facility]   Follow-up plan:   Return in about 2 months (around 03/06/2022) for Medication Management, in person.    Recent Visits Date Type Provider Dept  12/12/21 Office Visit Gillis Santa, MD [facility]  11/21/21 Office Visit Gillis Santa, MD [facility]  Showing recent visits within past 90 days and meeting all other requirements Today's Visits Date Type Provider Dept  01/04/22 Office Visit Gillis Santa, MD [facility]  Showing today's visits and meeting all other requirements Future Appointments Date Type Provider Dept  03/06/22 Appointment Gillis Santa, MD [facility]  Showing future appointments within next 90 days and meeting all other requirements  I discussed the assessment and treatment plan with the patient. The patient was provided an opportunity to ask questions and all were answered. The patient agreed with the plan and demonstrated an understanding of the instructions.  Patient advised to call back or  seek an in-person evaluation if the symptoms or condition worsens.  Duration of encounter: 78mnutes.  Total time on encounter, as per AMA guidelines included both the face-to-face and non-face-to-face time personally spent by the physician and/or other qualified health care professional(s) on the day of the encounter (includes time in activities that require the physician or other qualified health care professional and does not include time in activities normally performed by clinical staff). Physician's time may include the following activities when performed: preparing to see the patient (eg, review of tests, pre-charting review of records) obtaining and/or reviewing separately obtained history performing a medically appropriate examination and/or evaluation counseling and educating the patient/family/caregiver ordering medications, tests, or procedures referring and communicating with other health care professionals (when not separately reported) documenting clinical information in the electronic or other health record independently interpreting results (not separately reported) and communicating results to the patient/ family/caregiver care coordination (not separately reported)  Note by: BGillis Santa MD Date: 01/04/2022; Time: 2:45 PM

## 2022-01-04 NOTE — Progress Notes (Signed)
Safety precautions to be maintained throughout the outpatient stay will include: orient to surroundings, keep bed in low position, maintain call bell within reach at all times, provide assistance with transfer out of bed and ambulation.   Did not bring medication with him today. Stated that he did not know to. Instructed him to bring every appointment even if bottle is empty.  Patient with understanding.  States that he has about 10 pills at home.

## 2022-01-08 ENCOUNTER — Encounter
Admission: RE | Admit: 2022-01-08 | Discharge: 2022-01-08 | Disposition: A | Discharge status: Home / Self Care | Payer: 59 | Source: Ambulatory Visit | Attending: Surgery | Admitting: Surgery

## 2022-01-08 HISTORY — DX: Hyperlipidemia, unspecified: E78.5

## 2022-01-08 HISTORY — DX: Unspecified osteoarthritis, unspecified site: M19.90

## 2022-01-08 HISTORY — DX: Carpal tunnel syndrome, left upper limb: G56.02

## 2022-01-08 HISTORY — DX: Unilateral inguinal hernia, without obstruction or gangrene, not specified as recurrent: K40.90

## 2022-01-08 HISTORY — DX: Peripheral vascular disease, unspecified: I73.9

## 2022-01-08 NOTE — Patient Instructions (Addendum)
Your procedure is scheduled on: Wednesday, November 15 Report to the Registration Desk on the 1st floor of the [facility] To find out your arrival time, please call [phone] between 1PM - 3PM on: Tuesday, November 14 If your arrival time is 6:00 am, do not arrive prior to that time as the [facility] entrance doors do not open until 6:00 am.  REMEMBER: Instructions that are not followed completely may result in serious medical risk, up to and including death; or upon the discretion of your surgeon and anesthesiologist your surgery may need to be rescheduled.  Do not eat food after midnight the night before surgery.  No gum chewing, lozengers or hard candies.  You may however, drink CLEAR liquids up to 2 hours before you are scheduled to arrive for your surgery. Do not drink anything within 2 hours of your scheduled arrival time.  Clear liquids include: - water  - apple juice without pulp - gatorade (not RED colors) - black coffee or tea (Do NOT add milk or creamers to the coffee or tea) Do NOT drink anything that is not on this list.  In addition, your doctor has ordered for you to drink the provided  Ensure Pre-Surgery Clear Carbohydrate Drink  Drinking this carbohydrate drink up to two hours before surgery helps to reduce insulin resistance and improve patient outcomes. Please complete drinking 2 hours prior to scheduled arrival time.  TAKE THESE MEDICATIONS THE MORNING OF SURGERY WITH A SIP OF WATER:  Hydrocodone if needed for pain Pregabalin (Lyrica) Propranolol Rosuvastatin  One week prior to surgery: starting November 8 Stop aspirin and Anti-inflammatories (NSAIDS) such as Advil, Aleve, Ibuprofen, Motrin, Naproxen, Naprosyn and Aspirin based products such as Excedrin, Goodys Powder, BC Powder. Stop ANY OVER THE COUNTER supplements until after surgery. You may however, continue to take Tylenol if needed for pain up until the day of surgery.  No Alcohol for 24 hours  before or after surgery.  No Smoking including e-cigarettes for 24 hours prior to surgery.  No chewable tobacco products for at least 6 hours prior to surgery.  No nicotine patches on the day of surgery.  Do not use any "recreational" drugs for at least a week prior to your surgery.  Please be advised that the combination of cocaine and anesthesia may have negative outcomes, up to and including death. If you test positive for cocaine, your surgery will be cancelled.  On the morning of surgery brush your teeth with toothpaste and water, you may rinse your mouth with mouthwash if you wish. Do not swallow any toothpaste or mouthwash.  Use CHG Soap as directed on instruction sheet.  Do not wear jewelry, make-up, hairpins, clips or nail polish.  Do not wear lotions, powders, or perfumes.   Do not shave body from the neck down 48 hours prior to surgery just in case you cut yourself which could leave a site for infection.  Also, freshly shaved skin may become irritated if using the CHG soap.  Contact lenses, hearing aids and dentures may not be worn into surgery.  Do not bring valuables to the hospital. [facility] is not responsible for any missing/lost belongings or valuables.   Notify your doctor if there is any change in your medical condition (cold, fever, infection).  Wear comfortable clothing (specific to your surgery type) to the hospital.  After surgery, you can help prevent lung complications by doing breathing exercises.  Take deep breaths and cough every 1-2 hours. Your doctor  may order a device called an Incentive Spirometer to help you take deep breaths.  If you are being discharged the day of surgery, you will not be allowed to drive home. You will need a responsible adult (18 years or older) to drive you home and stay with you that night.   If you are taking public transportation, you will need to have a responsible adult (18 years or older) with you. Please confirm with  your physician that it is acceptable to use public transportation.   Please call the [facility] at [phone] if you have any questions about these instructions.  Surgery Visitation Policy:  Patients undergoing a surgery or procedure may have two family members or support persons with them as long as the person is not COVID-19 positive or experiencing its symptoms.      Preparing for Surgery with CHLORHEXIDINE GLUCONATE (CHG) Soap  Chlorhexidine Gluconate (CHG) Soap  o An antiseptic cleaner that kills germs and bonds with the skin to continue killing germs even after washing  o Used for showering the night before surgery and morning of surgery  Before surgery, you can play an important role by reducing the number of germs on your skin.  CHG (Chlorhexidine gluconate) soap is an antiseptic cleanser which kills germs and bonds with the skin to continue killing germs even after washing.  Please do not use if you have an allergy to CHG or antibacterial soaps. If your skin becomes reddened/irritated stop using the CHG.  1. Shower the NIGHT BEFORE SURGERY and the MORNING OF SURGERY with CHG soap.  2. If you choose to wash your hair, wash your hair first as usual with your normal shampoo.  3. After shampooing, rinse your hair and body thoroughly to remove the shampoo.  4. Use CHG as you would any other liquid soap. You can apply CHG directly to the skin and wash gently with a scrungie or a clean washcloth.  5. Apply the CHG soap to your body only from the neck down. Do not use on open wounds or open sores. Avoid contact with your eyes, ears, mouth, and genitals (private parts). Wash face and genitals (private parts) with your normal soap.  6. Wash thoroughly, paying special attention to the area where your surgery will be performed.  7. Thoroughly rinse your body with warm water.  8. Do not shower/wash with your normal soap after using and rinsing off the CHG  soap.  9. Pat yourself dry with a clean towel.  10. Wear clean pajamas to bed the night before surgery.  12. Place clean sheets on your bed the night of your first shower and do not sleep with pets.  13. Shower again with the CHG soap on the day of surgery prior to arriving at the hospital.  14. Do not apply any deodorants/lotions/powders.  15. Please wear clean clothes to the hospital.

## 2022-01-10 ENCOUNTER — Telehealth: Payer: Self-pay

## 2022-01-10 NOTE — Telephone Encounter (Signed)
Insurance Treatment Denial Note  Date order was entered:  Order entered by: Edward Jolly, MD Requested treatment: CT angio abdomen Reason for denial: Pertinent documentation of imaging is missing Recommended for approval:  Xrays or ultrasound   They denied the CT angio because he hasn't had any recent Xray or ultrasound to support further imaging.

## 2022-01-17 ENCOUNTER — Other Ambulatory Visit: Payer: Self-pay

## 2022-01-17 ENCOUNTER — Encounter: Payer: Self-pay | Admitting: Surgery

## 2022-01-17 ENCOUNTER — Ambulatory Visit: Payer: 59

## 2022-01-17 ENCOUNTER — Encounter
Admission: RE | Disposition: A | Discharge status: Home / Self Care | Payer: Self-pay | Source: Home / Self Care | Attending: Surgery

## 2022-01-17 ENCOUNTER — Ambulatory Visit
Admission: RE | Admit: 2022-01-17 | Discharge: 2022-01-17 | Disposition: A | Discharge status: Home / Self Care | Payer: 59 | Attending: Surgery | Admitting: Surgery

## 2022-01-17 DIAGNOSIS — M720 Palmar fascial fibromatosis [Dupuytren]: Secondary | ICD-10-CM | POA: Insufficient documentation

## 2022-01-17 DIAGNOSIS — G5602 Carpal tunnel syndrome, left upper limb: Secondary | ICD-10-CM | POA: Diagnosis not present

## 2022-01-17 DIAGNOSIS — M5416 Radiculopathy, lumbar region: Secondary | ICD-10-CM

## 2022-01-17 DIAGNOSIS — G894 Chronic pain syndrome: Secondary | ICD-10-CM

## 2022-01-17 DIAGNOSIS — M792 Neuralgia and neuritis, unspecified: Secondary | ICD-10-CM

## 2022-01-17 HISTORY — PX: DUPUYTREN CONTRACTURE RELEASE: SHX1478

## 2022-01-17 SURGERY — RELEASE, DUPUYTREN CONTRACTURE
Anesthesia: General | Site: Wrist | Laterality: Left

## 2022-01-17 MED ORDER — CEFAZOLIN SODIUM-DEXTROSE 2-4 GM/100ML-% IV SOLN
INTRAVENOUS | Status: AC
  Filled 2022-01-17: qty 100

## 2022-01-17 MED ORDER — CHLORHEXIDINE GLUCONATE 0.12 % MT SOLN
OROMUCOSAL | Status: AC
  Administered 2022-01-17: 15 mL via OROMUCOSAL
  Filled 2022-01-17: qty 15

## 2022-01-17 MED ORDER — FENTANYL CITRATE (PF) 100 MCG/2ML IJ SOLN: 25.0000 ug | INTRAMUSCULAR | Status: DC | PRN

## 2022-01-17 MED ORDER — OXYCODONE HCL 5 MG/5ML PO SOLN: 5.0000 mg | Freq: Once | ORAL | Status: AC | PRN

## 2022-01-17 MED ORDER — MIDAZOLAM HCL 2 MG/2ML IJ SOLN
INTRAMUSCULAR | Status: AC
  Filled 2022-01-17: qty 2

## 2022-01-17 MED ORDER — EPHEDRINE SULFATE (PRESSORS) 50 MG/ML IJ SOLN
INTRAMUSCULAR | Status: DC | PRN
  Administered 2022-01-17: 10 mg via INTRAVENOUS
  Administered 2022-01-17: 5 mg via INTRAVENOUS

## 2022-01-17 MED ORDER — ONDANSETRON HCL 4 MG/2ML IJ SOLN: 4.0000 mg | Freq: Once | INTRAMUSCULAR | Status: DC | PRN

## 2022-01-17 MED ORDER — FAMOTIDINE 20 MG PO TABS: 20.0000 mg | ORAL_TABLET | Freq: Once | ORAL | Status: AC

## 2022-01-17 MED ORDER — BUPIVACAINE HCL (PF) 0.5 % IJ SOLN
INTRAMUSCULAR | Status: DC | PRN
  Administered 2022-01-17: 20 mL

## 2022-01-17 MED ORDER — ACETAMINOPHEN 10 MG/ML IV SOLN
INTRAVENOUS | Status: DC | PRN
  Administered 2022-01-17: 1000 mg via INTRAVENOUS

## 2022-01-17 MED ORDER — BUPIVACAINE HCL (PF) 0.5 % IJ SOLN
INTRAMUSCULAR | Status: AC
  Filled 2022-01-17: qty 30

## 2022-01-17 MED ORDER — OXYCODONE HCL 5 MG PO TABS
ORAL_TABLET | ORAL | Status: AC
  Filled 2022-01-17: qty 1

## 2022-01-17 MED ORDER — ONDANSETRON HCL 4 MG/2ML IJ SOLN
INTRAMUSCULAR | Status: DC | PRN
  Administered 2022-01-17: 4 mg via INTRAVENOUS

## 2022-01-17 MED ORDER — MIDAZOLAM HCL 2 MG/2ML IJ SOLN
INTRAMUSCULAR | Status: DC | PRN
  Administered 2022-01-17: 2 mg via INTRAVENOUS

## 2022-01-17 MED ORDER — PHENYLEPHRINE 80 MCG/ML (10ML) SYRINGE FOR IV PUSH (FOR BLOOD PRESSURE SUPPORT)
PREFILLED_SYRINGE | INTRAVENOUS | Status: AC
  Filled 2022-01-17: qty 10

## 2022-01-17 MED ORDER — DEXAMETHASONE SODIUM PHOSPHATE 10 MG/ML IJ SOLN
INTRAMUSCULAR | Status: AC
  Filled 2022-01-17: qty 1

## 2022-01-17 MED ORDER — PROPOFOL 10 MG/ML IV BOLUS
INTRAVENOUS | Status: DC | PRN
  Administered 2022-01-17: 130 mg via INTRAVENOUS

## 2022-01-17 MED ORDER — PHENYLEPHRINE HCL-NACL 20-0.9 MG/250ML-% IV SOLN
INTRAVENOUS | Status: DC | PRN
  Administered 2022-01-17: 40 ug/min via INTRAVENOUS

## 2022-01-17 MED ORDER — EPHEDRINE 5 MG/ML INJ
INTRAVENOUS | Status: AC
  Filled 2022-01-17: qty 5

## 2022-01-17 MED ORDER — ONDANSETRON HCL 4 MG/2ML IJ SOLN
INTRAMUSCULAR | Status: AC
  Filled 2022-01-17: qty 2

## 2022-01-17 MED ORDER — CHLORHEXIDINE GLUCONATE 0.12 % MT SOLN: 15.0000 mL | Freq: Once | OROMUCOSAL | Status: AC

## 2022-01-17 MED ORDER — KETOROLAC TROMETHAMINE 30 MG/ML IJ SOLN
INTRAMUSCULAR | Status: AC
  Filled 2022-01-17: qty 1

## 2022-01-17 MED ORDER — CEFAZOLIN SODIUM-DEXTROSE 2-4 GM/100ML-% IV SOLN
2.0000 g | INTRAVENOUS | Status: AC
  Administered 2022-01-17: 2 g via INTRAVENOUS

## 2022-01-17 MED ORDER — OXYCODONE HCL 5 MG PO TABS
5.0000 mg | ORAL_TABLET | Freq: Once | ORAL | Status: AC | PRN
  Administered 2022-01-17: 5 mg via ORAL

## 2022-01-17 MED ORDER — ORAL CARE MOUTH RINSE: 15.0000 mL | Freq: Once | OROMUCOSAL | Status: AC

## 2022-01-17 MED ORDER — LIDOCAINE HCL (CARDIAC) PF 100 MG/5ML IV SOSY
PREFILLED_SYRINGE | INTRAVENOUS | Status: DC | PRN
  Administered 2022-01-17: 100 mg via INTRAVENOUS

## 2022-01-17 MED ORDER — 0.9 % SODIUM CHLORIDE (POUR BTL) OPTIME
TOPICAL | Status: DC | PRN
  Administered 2022-01-17: 500 mL

## 2022-01-17 MED ORDER — FENTANYL CITRATE (PF) 100 MCG/2ML IJ SOLN
INTRAMUSCULAR | Status: DC | PRN
  Administered 2022-01-17: 25 ug via INTRAVENOUS
  Administered 2022-01-17: 50 ug via INTRAVENOUS
  Administered 2022-01-17: 25 ug via INTRAVENOUS

## 2022-01-17 MED ORDER — ACETAMINOPHEN 10 MG/ML IV SOLN
INTRAVENOUS | Status: AC
  Filled 2022-01-17: qty 100

## 2022-01-17 MED ORDER — FENTANYL CITRATE (PF) 100 MCG/2ML IJ SOLN
INTRAMUSCULAR | Status: AC
  Filled 2022-01-17: qty 2

## 2022-01-17 MED ORDER — HYDROCODONE-ACETAMINOPHEN 5-325 MG PO TABS: 1.0000 | ORAL_TABLET | Freq: Four times a day (QID) | ORAL | 0 refills | Status: AC | PRN

## 2022-01-17 MED ORDER — LACTATED RINGERS IV SOLN: INTRAVENOUS | Status: DC

## 2022-01-17 MED ORDER — DEXAMETHASONE SODIUM PHOSPHATE 10 MG/ML IJ SOLN
INTRAMUSCULAR | Status: DC | PRN
  Administered 2022-01-17: 10 mg via INTRAVENOUS

## 2022-01-17 MED ORDER — LIDOCAINE HCL (PF) 2 % IJ SOLN
INTRAMUSCULAR | Status: AC
  Filled 2022-01-17: qty 5

## 2022-01-17 MED ORDER — KETOROLAC TROMETHAMINE 30 MG/ML IJ SOLN
INTRAMUSCULAR | Status: DC | PRN
  Administered 2022-01-17: 30 mg via INTRAVENOUS

## 2022-01-17 MED ORDER — PHENYLEPHRINE HCL (PRESSORS) 10 MG/ML IV SOLN
INTRAVENOUS | Status: DC | PRN
  Administered 2022-01-17 (×4): 80 ug via INTRAVENOUS

## 2022-01-17 MED ORDER — FAMOTIDINE 20 MG PO TABS
ORAL_TABLET | ORAL | Status: AC
  Administered 2022-01-17: 20 mg via ORAL
  Filled 2022-01-17: qty 1

## 2022-01-17 MED ORDER — PROPOFOL 10 MG/ML IV BOLUS
INTRAVENOUS | Status: AC
  Filled 2022-01-17: qty 20

## 2022-01-17 SURGICAL SUPPLY — 33 items
BNDG COHESIVE 4X5 TAN STRL LF (GAUZE/BANDAGES/DRESSINGS) ×1 IMPLANT
BNDG ELASTIC 2X5.8 VLCR STR LF (GAUZE/BANDAGES/DRESSINGS) ×1 IMPLANT
BNDG ELASTIC 3X5.8 VLCR STR LF (GAUZE/BANDAGES/DRESSINGS) ×1 IMPLANT
BNDG ESMARK 4X12 TAN STRL LF (GAUZE/BANDAGES/DRESSINGS) ×1 IMPLANT
CHLORAPREP W/TINT 26 (MISCELLANEOUS) ×1 IMPLANT
CORD BIP STRL DISP 12FT (MISCELLANEOUS) ×1 IMPLANT
CUFF TOURN SGL QUICK 18X4 (TOURNIQUET CUFF) ×1 IMPLANT
DRAPE SURG 17X11 SM STRL (DRAPES) ×1 IMPLANT
ELECT REM PT RETURN 9FT ADLT (ELECTROSURGICAL) ×1
ELECTRODE REM PT RTRN 9FT ADLT (ELECTROSURGICAL) ×1 IMPLANT
FORCEPS JEWEL BIP 4-3/4 STR (INSTRUMENTS) ×1 IMPLANT
GAUZE SPONGE 4X4 12PLY STRL (GAUZE/BANDAGES/DRESSINGS) ×1 IMPLANT
GAUZE STRETCH 2X75IN STRL (MISCELLANEOUS) ×1 IMPLANT
GAUZE XEROFORM 1X8 LF (GAUZE/BANDAGES/DRESSINGS) ×1 IMPLANT
GLOVE BIO SURGEON STRL SZ8 (GLOVE) ×2 IMPLANT
GLOVE SURG UNDER LTX SZ8 (GLOVE) ×1 IMPLANT
GOWN STRL REUS W/ TWL LRG LVL3 (GOWN DISPOSABLE) ×1 IMPLANT
GOWN STRL REUS W/ TWL XL LVL3 (GOWN DISPOSABLE) ×1 IMPLANT
GOWN STRL REUS W/TWL LRG LVL3 (GOWN DISPOSABLE) ×1
GOWN STRL REUS W/TWL XL LVL3 (GOWN DISPOSABLE) ×1
KIT TURNOVER KIT A (KITS) ×1 IMPLANT
MANIFOLD NEPTUNE II (INSTRUMENTS) ×1 IMPLANT
NS IRRIG 1000ML POUR BTL (IV SOLUTION) ×1 IMPLANT
NS IRRIG 500ML POUR BTL (IV SOLUTION) ×1 IMPLANT
PACK EXTREMITY ARMC (MISCELLANEOUS) ×1 IMPLANT
PAD PREP 24X41 OB/GYN DISP (PERSONAL CARE ITEMS) ×1 IMPLANT
SPONGE GAUZE 2X2 8PLY STRL LF (GAUZE/BANDAGES/DRESSINGS) ×1 IMPLANT
STOCKINETTE IMPERVIOUS 9X36 MD (GAUZE/BANDAGES/DRESSINGS) ×1 IMPLANT
SUT PROLENE 4 0 PS 2 18 (SUTURE) ×1 IMPLANT
SUT VIC AB 3-0 SH 27 (SUTURE) ×1
SUT VIC AB 3-0 SH 27X BRD (SUTURE) ×1 IMPLANT
TRAP FLUID SMOKE EVACUATOR (MISCELLANEOUS) ×1 IMPLANT
WATER STERILE IRR 500ML POUR (IV SOLUTION) ×1 IMPLANT

## 2022-01-17 NOTE — Anesthesia Procedure Notes (Signed)
Procedure Name: LMA Insertion Date/Time: 01/17/2022 10:32 AM  Performed by: Morene [facility], CRNAPre-anesthesia Checklist: Patient identified, Patient being monitored, Timeout performed, Emergency Drugs available and Suction available Patient Re-evaluated:Patient Re-evaluated prior to induction Oxygen Delivery Method: Circle system utilized Preoxygenation: Pre-oxygenation with 100% oxygen Induction Type: IV induction Ventilation: Mask ventilation without difficulty LMA: LMA inserted LMA Size: 4.0 Tube type: Oral Number of attempts: 1 Placement Confirmation: positive ETCO2 and breath sounds checked- equal and bilateral Tube secured with: Tape Dental Injury: Teeth and Oropharynx as per pre-operative assessment

## 2022-01-17 NOTE — Transfer of Care (Signed)
Immediate Anesthesia Transfer of Care Note  Patient: [name]  Procedure(s) Performed: ENDOSCOPIC LEFT CARPAL TUNNEL RELEASE AND 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. (Left: Wrist)  Patient Location: PACU  Anesthesia Type:General  Level of Consciousness: drowsy  Airway & Oxygen Therapy: Patient Spontanous Breathing and Patient connected to face mask oxygen  Post-op Assessment: Report given to RN and Post -op Vital signs reviewed and stable  Post vital signs: Reviewed and stable  Last Vitals:  Vitals Value Taken Time  BP 131/79 01/17/22 1146  Temp 36.7 C 01/17/22 1145  Pulse 71 01/17/22 1153  Resp 17 01/17/22 1153  SpO2 100 % 01/17/22 1153  Vitals shown include unvalidated device data.  Last Pain:  Vitals:   01/17/22 1145  TempSrc:   PainSc: Asleep         Complications: No notable events documented.

## 2022-01-17 NOTE — Discharge Instructions (Addendum)
Orthopedic discharge instructions: Keep splint dry and intact. Keep hand elevated above heart level. Apply ice to affected area frequently. Take pain medication as prescribed or ES Tylenol when needed.  Return for follow-up in 5 days as scheduled.    AMBULATORY SURGERY  DISCHARGE INSTRUCTIONS   The drugs that you were given will stay in your system until tomorrow so for the next 24 hours you should not:  Drive an automobile Make any legal decisions Drink any alcoholic beverage   You may resume regular meals tomorrow.  Today it is better to start with liquids and gradually work up to solid foods.  You may eat anything you prefer, but it is better to start with liquids, then soup and crackers, and gradually work up to solid foods.   Please notify your doctor immediately if you have any unusual bleeding, trouble breathing, redness and pain at the surgery site, drainage, fever, or pain not relieved by medication.    Additional Instructions:  Please contact your physician with any problems or Same Day Surgery at [phone], Monday through Friday 6 am to 4 pm, or [place] [facility] [place] number at [phone].

## 2022-01-17 NOTE — Op Note (Addendum)
01/17/2022  12:04 PM  Patient:   [name]  Pre-Op Diagnosis:   1.  Left carpal tunnel syndrome.  2.  Dupuytren's contracture of left ring finger.  Post-Op Diagnosis:   Same.  Procedure:   1.  Endoscopic left carpal tunnel release.  2.  Excision of Dupuytren's contracture of left ring finger.  Surgeon:   Maryagnes Amos, MD  Anesthesia:   General LMA  Findings:   As above.  Complications:   None  EBL:   1 cc  Fluids:   800 cc crystalloid  TT:   51 minutes at 250 mmHg  Drains:   None  Closure:   4-0 Prolene interrupted sutures  Brief Clinical Note:   The patient is a 56 year old male with a history of progressively worsening pain and paresthesias to his left hand. His symptoms have progressed despite medications, activity modification, etc. His history and examination consistent with carpal tunnel syndrome, confirmed by EMG.  The patient presents at this time for an endoscopic left carpal tunnel release.  The patient also notes a slowly progressive thickened cord in the palm of his hand extending to the left ring finger which appears to be causing a contracture of the left ring MCP joint. His history and examination consistent with a Dupuytren's contracture of the left ring finger. He would like to have this excised as well.  Procedure:   The patient was brought into the operating room and lain in the supine position. After adequate general laryngeal mask anesthesia was obtained, the left hand and upper extremity were prepped with ChloraPrep solution before being draped sterilely. Preoperative antibiotics were administered. A timeout was performed to verify the appropriate surgical site before the limb was exsanguinated with an Esmarch and the tourniquet inflated to 250 mmHg.   An approximately 1.5-2 cm incision was made over the volar wrist flexion crease, centered over the palmaris longus tendon. The incision was carried down through the subcutaneous tissues with care  taken to identify and protect any neurovascular structures. The distal forearm fascia was penetrated just proximal to the transverse carpal ligament. The soft tissues were released off the superficial and deep surfaces of the distal forearm fascia and this was released proximally for 3-4 cm under direct visualization.  Attention was directed distally. The Therapist, nutritional was passed beneath the transverse carpal ligament along the ulnar aspect of the carpal tunnel and used to release any adhesions as well as to remove any adherent synovial tissue before first the smaller then the larger of the two dilators were passed beneath the transverse carpal ligament along the ulnar margin of the carpal tunnel. The slotted cannula was introduced and the endoscope was placed into the slotted cannula and the undersurface of the transverse carpal ligament visualized. The distal margin of the transverse carpal ligament was marked by placing a 25-gauge needle percutaneously at Kaplan's cardinal point so that it entered the distal portion of the slotted cannula. Under endoscopic visualization, the transverse carpal ligament was released from proximal to distal using the end-cutting blade. A second pass was performed to ensure complete release of the ligament. The adequacy of release was verified both endoscopically and by palpation using the freer elevator.  Attention was then directed to the Dupuytren's contracture. A Brunner type zigzag incision was made along the volar aspect of the left ring finger beginning just proximal to the proximal palmar crease and extending to just proximal to the PIP flexion crease. The incision was carried down through subcutaneous tissues.  The fibrous cord was identified and carefully dissected out from proximal to distal after releasing it proximally. As dissection was carried out, care was taken to identify and protect the common digital nerve and artery on either side of the cord, as well as the  underlying flexor tendon, proximally. More distally, the digital neurovascular bundles were identified and protected. After the mass was removed in its entirety, the adequacy of excision was verified by palpation as well as visually. After excision of the Dupuytren's tissue, the left ring finger MCP and PIP joints could be extended fully.  Each wound was irrigated thoroughly with sterile saline solution before being closed using 4-0 Prolene interrupted sutures. A total of 20 cc of 0.5% plain Sensorcaine was injected in and around the incision before a sterile bulky dressing was applied to the wound. The patient was placed into a volar wrist splint maintaining the wrist at neutral position and the fingers in extension before being awakened, extubated, and returned to the recovery room in satisfactory condition after tolerating the procedure well.

## 2022-01-17 NOTE — Anesthesia Preprocedure Evaluation (Signed)
Anesthesia Evaluation  Patient identified by MRN, date of birth, ID band Patient awake    Reviewed: Allergy & Precautions, NPO status , Patient's Chart, lab work & pertinent test results  History of Anesthesia Complications Negative for: history of anesthetic complications  Airway Mallampati: II  TM Distance: >3 FB Neck ROM: Full    Dental  (+) Teeth Intact   Pulmonary neg shortness of breath, neg sleep apnea, neg COPD, neg recent URI, Current Smoker and Patient abstained from smoking.   breath sounds clear to auscultation- rhonchi (-) wheezing      Cardiovascular Exercise Tolerance: Good (-) hypertension(-) angina + Peripheral Vascular Disease  (-) CAD and (-) Past MI  Rhythm:Regular Rate:Normal - Systolic murmurs and - Diastolic murmurs    Neuro/Psych negative neurological ROS  negative psych ROS   GI/Hepatic negative GI ROS, Neg liver ROS,,,  Endo/Other  negative endocrine ROSneg diabetes    Renal/GU negative Renal ROS     Musculoskeletal negative musculoskeletal ROS (+)    Abdominal  (+) - obese  Peds  Hematology negative hematology ROS (+)   Anesthesia Other Findings Past Medical History: No date: History of kidney stones No date: Leg pain 04/04/2017: Low back pain No date: Patient denies medical problems No date: Peripheral vascular disease (HCC)   Reproductive/Obstetrics                              Anesthesia Physical Anesthesia Plan  ASA: 2  Anesthesia Plan: General   Post-op Pain Management: Ofirmev IV (intra-op)*   Induction: Intravenous  PONV Risk Score and Plan: 0 and 2 and Ondansetron, Dexamethasone, Treatment may vary due to age or medical condition and Midazolam  Airway Management Planned: LMA  Additional Equipment: None  Intra-op Plan:   Post-operative Plan: Extubation in OR  Informed Consent: I have reviewed the patients History and Physical, chart,  labs and discussed the procedure including the risks, benefits and alternatives for the proposed anesthesia with the patient or authorized representative who has indicated his/her understanding and acceptance.     Dental advisory given  Plan Discussed with: CRNA and Anesthesiologist  Anesthesia Plan Comments: (Discussed risks of anesthesia with patient, including PONV, sore throat, lip/dental/eye damage. Rare risks discussed as well, such as cardiorespiratory and neurological sequelae, and allergic reactions. Discussed the role of CRNA in patient's perioperative care. Patient understands. Patient counseled on benefits of smoking cessation, and increased perioperative risks associated with continued smoking. )         Anesthesia Quick Evaluation

## 2022-01-17 NOTE — Anesthesia Postprocedure Evaluation (Signed)
Anesthesia Post Note  Patient: [name]  Procedure(s) Performed: ENDOSCOPIC LEFT CARPAL TUNNEL RELEASE AND 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. (Left: Wrist)  Patient location during evaluation: PACU Anesthesia Type: General Level of consciousness: awake and alert Pain management: pain level controlled Vital Signs Assessment: post-procedure vital signs reviewed and stable Respiratory status: spontaneous breathing, nonlabored ventilation, respiratory function stable and patient connected to nasal cannula oxygen Cardiovascular status: blood pressure returned to baseline and stable Postop Assessment: no apparent nausea or vomiting Anesthetic complications: no   No notable events documented.   Last Vitals:  Vitals:   01/17/22 1200 01/17/22 1211  BP: 101/68   Pulse: 70 75  Resp: 17 15  Temp:    SpO2: 100% 100%    Last Pain:  Vitals:   01/17/22 1211  TempSrc:   PainSc: 0-No pain                 Corinda Gubler

## 2022-01-17 NOTE — H&P (Signed)
History of Present Illness: [name] is a 56 y.o. male who presents today for his surgical history and physical for his upcoming left endoscopic carpal tunnel release in addition to excision of Dupuytren's contracture involving the left palm and ring finger. The patient is scheduled with Dr. Joice Lofts on 01/17/2022. The patient denies any changes in his medical history since he was last evaluated. He denies any changes in his symptoms in the left hand, the patient's reports moderate pain in the palm left hand in addition to a burning and tingling discomfort left hand. The patient has undergone left carpal tunnel steroid injections which provided partial relief of his symptoms. The patient denies any personal history of heart attack or stroke. He denies any history of asthma or COPD. The patient does have a history of a bypass. The patient denies any personal history of DVT or diabetes.  Past Medical History: Arthritis  Brain cancer (CMS-HCC)  History of chicken pox   Past Surgical History: BYPASS GRAFT AORTOBI-ILIAC  HERNIA REPAIR  KNEE ARTHROSCOPY Left  WISDOM TEETH   Past Family History: Heart disease Mother  No Known Problems Father  No Known Problems Sister  No Known Problems Sister  No Known Problems Sister  No Known Problems Brother  Heart disease Brother   Medications: aspirin 81 MG EC tablet Take 81 mg by mouth once daily  atorvastatin (LIPITOR) 20 MG tablet Take 20 mg by mouth once daily  gabapentin (NEURONTIN) 300 MG capsule TAKE 1 CAPSULE BY MOUTH THREE TIMES DAILY 270 capsule 0  HYDROcodone-acetaminophen (NORCO) 5-325 mg tablet Take 1 tablet by mouth every 6 (six) hours as needed for Pain  ibuprofen (MOTRIN) 800 MG tablet Take 800 mg by mouth every 8 (eight) hours as needed  pregabalin (LYRICA) 50 MG capsule Take 50 mg by mouth 2 (two) times daily  propranoloL (INDERAL) 10 MG tablet Take 10 mg by mouth 3 (three) times daily  rosuvastatin (CRESTOR) 20 MG tablet Take 20  mg by mouth once daily   Allergies: No Known Allergies   Review of Systems:  A comprehensive 14 point ROS was performed, reviewed by me today, and the pertinent orthopaedic findings are documented in the HPI.  Physical Exam: BP 128/76  Ht 165.1 cm (5\' 5" )  Wt 79.3 kg (174 lb 12.8 oz)  BMI 29.09 kg/m  General/Constitutional: The patient appears to be well-nourished, well-developed, and in no acute distress. Neuro/Psych: Normal mood and affect, oriented to person, place and time. Eyes: Non-icteric. Pupils are equal, round, and reactive to light, and exhibit synchronous movement. ENT: Unremarkable. Lymphatic: No palpable adenopathy. Respiratory: Lungs clear to auscultation, Normal chest excursion, No wheezes, and Non-labored breathing Cardiovascular: Regular rate and rhythm. No murmurs. and No edema, swelling or tenderness, except as noted in detailed exam. Integumentary: No impressive skin lesions present, except as noted in detailed exam. Musculoskeletal: Unremarkable, except as noted in detailed exam.  Left hand/wrist exam: Skin inspection of the left wrist and hand is notable for a Dupuytren's contracture palmar tissue to the ring metacarpal and resulting in a flexion deformity of approximately 25 degrees of the ring MCP joint, but otherwise is unremarkable. No swelling, erythema, ecchymosis, abrasions, or other skin abnormalities are identified. He exhibits full active and passive range of motion of the left wrist without any pain or catching. He is able to actively flex and extend all digits without any pain or triggering, with limitation only obtaining full extension of the ring MCP joint. He is neurovascularly intact to  all digits of his left hand. He exhibits an equivocally positive Tinel's over the left carpal tunnel, but does have a positive Phalen's test on the left.  EMG results:  A recent EMG of both hands and upper extremities is available for review and has been reviewed by  myself. By report, the study demonstrates evidence of "bilateral mild (grade 2) carpal tunnel syndrome. This report was reviewed by myself and discussed with the patient.  Impression: 1. Carpal tunnel syndrome, left. 2. Dupuytren's contracture of left hand.  Plan:  1. Treatment options were discussed today with the patient. 2. The patient is scheduled for a left endoscopic carpal tunnel release procedure in addition to a excision of Dupuytren's contracture involving the left palm and ring finger with Dr. Joice Lofts on 01/17/2022. 3. The patient was instructed on the risk and benefits of surgery and wishes to proceed at this time. 4. This document will serve as a surgical history and physical for the patient. 5. The patient will follow-up 5 days after surgery to undergo a skin check of the left hand. They can call the clinic they have any questions, new symptoms develop or symptoms worsen.    H&P reviewed and patient re-examined. No changes.

## 2022-01-18 LAB — SURGICAL PATHOLOGY

## 2022-02-05 ENCOUNTER — Ambulatory Visit: Payer: 59 | Attending: Surgery | Admitting: Occupational Therapy

## 2022-02-05 DIAGNOSIS — M79642 Pain in left hand: Secondary | ICD-10-CM | POA: Diagnosis present

## 2022-02-05 DIAGNOSIS — M25642 Stiffness of left hand, not elsewhere classified: Secondary | ICD-10-CM

## 2022-02-05 DIAGNOSIS — M6281 Muscle weakness (generalized): Secondary | ICD-10-CM

## 2022-02-11 NOTE — Therapy (Signed)
[facility] [address], [place], [address] Phone: [phone]   Fax:  [phone]  Occupational Therapy Evaluation  Patient Details  Name: [name] MRN: [id number] Date of Birth: 01/31/1966 Referring Provider (OT): [name]   Encounter Date: 02/05/2022   OT End of Session - 02/11/22 1947     Visit Number 1    Number of Visits 12    Date for OT Re-Evaluation 03/19/22    OT Start Time 1430    OT Stop Time 1525    OT Time Calculation (min) 55 min    Activity Tolerance Patient tolerated treatment well    Behavior During Therapy [name] for tasks assessed/performed             Past Medical History:  Diagnosis Date   Carpal tunnel syndrome of left wrist    DJD (degenerative joint disease)    History of kidney stones    Hyperlipidemia    Leg pain    Low back pain 04/04/2017   PAD (peripheral artery disease) ([place])    Peripheral vascular disease ([place])    Right inguinal hernia     Past Surgical History:  Procedure Laterality Date   AORTA - BILATERAL FEMORAL ARTERY BYPASS GRAFT N/A 05/22/2017   Procedure: AORTA BIFEMORAL BYPASS GRAFT, Aortic bilateral, and internal ischemia;  Surgeon: Douglas Cabal, MD;  Location: [facility];  Service: Vascular;  Laterality: N/A;   CENTRAL VENOUS CATHETER INSERTION Left 05/22/2017   Procedure: INSERTION CENTRAL LINE ADULT Left Internal Jugular;  Surgeon: Douglas Cabal, MD;  Location: [facility];  Service: Vascular;  Laterality: Left;   COLONOSCOPY WITH PROPOFOL N/A 03/27/2017   Procedure: COLONOSCOPY WITH PROPOFOL;  Surgeon: Douglas Bellow, MD;  Location: [facility];  Service: Endoscopy;  Laterality: N/A;   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   Procedure: ENDOSCOPIC LEFT CARPAL TUNNEL RELEASE AND 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   Subjective Assessment - 02/11/22 1947     Subjective  Pt reports he had surgery on Jan 17, 2022, for left carpal tunnel and duputryns contracture.  Pt reports he has history of back and neck pain.  Reports numbness in left hand prior to surgery and feels it is better since surgery.  Pt reports difficulty with grabbing, holding items, cooking and meal prep tasks such as peeling a potato.    Pertinent History Per chart review from last MD visit note on 01/29/2022: [name] is a 56 y.o. male who presents today for a repeat skin check and suture removal status post a endoscopic left carpal tunnel release in addition to excision Dupuytren's contracture involving the left ring finger. The patient underwent surgery with Dr. Roland [name] on 01/17/2022. He was initially evaluated on 01/22/2022 for a skin check which revealed no evidence of infection, and the incision was healing well without any complications  at this time. The patient presents today for suture removal. Pain score today is a 4 out of 10. He denies any trauma or injury affecting left hand since his last evaluation. He has been in the splint since his last evaluation removing only yesterday to shower and wash his forearm. He states that he has been able to keep the hand dry. He denies any numbness or tingling the left upper extremity at today's visit. He denies any purulent drainage or signs of infection at this time. He does take chronic pain medication prescribed by  pain clinic. He has not been doing any formal therapy yet.    Patient Stated Goals Pt reports he would like to be able to use his hand better for daily tasks.    Currently in Pain? Yes    Pain Score 2     Pain Location Hand    Pain Orientation Left    Pain Descriptors / Indicators Aching    Pain Type Surgical pain    Pain Onset More than a month ago    Pain Frequency Intermittent    Pain Relieving Factors Pt reports he is taking pain meds on a regular basis so pain has been limited.               [facility] OT Assessment - 02/11/22 2003       Assessment   Medical Diagnosis s/p left carpal tunnel release, dupuytrens release    Referring Provider (OT) [name]    Onset Date/Surgical Date 01/17/22    Hand Dominance Left    Prior Therapy none      Precautions   Precaution Comments non weightbearing left hand and wrist      Balance Screen   Has the patient fallen in the past 6 months No      Home  Environment   Family/patient expects to be discharged to: Private residence    Living Arrangements Alone    Type of Home House    Home Layout One level      Prior Function   Level of Independence Independent    Vocation Unemployed    Vocation Requirements factory work    Leisure sports      ADL   Eating/Feeding Needs assist with cutting food    ADL comments Pt reports difficulty with holding items, meal preparation tasks, unable to peel vegetables.  Pt is modified independent with basic self care tasks, requires assist for cutting food.  Slower to complete all tasks which require bilateral hand use.      IADL   Prior Level of Function Light Housekeeping independent    Prior Level of Function Meal Prep independent    Prior Level of Function Community Mobility independent    Community Mobility Drives own vehicle      Observation/Other Assessments   Focus on Therapeutic Outcomes (FOTO)  43      Sensation   Light Touch Impaired by gross assessment    Additional Comments Pt  reports numbness and tingling prior to surgery and reports this has improved, mild decreased light touch and occasional burning sensation post surgery.  Tenderness left ring finger with flexion. Mild edema present.      Edema   Edema mild edema present in left ring finger, palm      AROM   Right Shoulder Flexion 130 Degrees    Left Shoulder Flexion 135 Degrees    Right Wrist Extension 48 Degrees    Right Wrist Flexion 50 Degrees  Left Wrist Extension 47 Degrees    Left Wrist Flexion 45 Degrees      Right Hand AROM   R Index  MCP 0-90 90 Degrees    R Index PIP 0-100 90 Degrees    R Long  MCP 0-90 90 Degrees    R Long PIP 0-100 90 Degrees    R Ring  MCP 0-90 90 Degrees    R Ring PIP 0-100 90 Degrees    R Little  MCP 0-90 90 Degrees    R Little PIP 0-100 90 Degrees      Left Hand AROM   L Index  MCP 0-90 70 Degrees    L Index PIP 0-100 85 Degrees    L Long  MCP 0-90 75 Degrees    L Long PIP 0-100 90 Degrees    L Ring  MCP 0-90 65 Degrees    L Ring PIP 0-100 90 Degrees    L Little  MCP 0-90 60 Degrees    L Little PIP 0-100 90 Degrees            Pt able to demonstrate full fisting of left hand with effort, full extension. Opposition on left to index, long finger, ring with effort and unable to oppose thumb to small finger.  Pt with small scab at base of left ring finger, between ring and small finger.    Right hand with full ROM of digits but has evidence of dupuytrens contracture on right hand as well.    Treatment: Pt seen for use of contrast utilizing hot pack and cold back, alternating for edema control management , 3 mins warm, 1 min cold for 9 mins total  ROM with tendon gliding exercises with cues for full finger extension  Pt educated on HEP for use of contrast, ROM exercises, scar massage       OT Education - 02/11/22 1954     Education Details Role of OT, goals, POC, ROM exercises, use of contrast    Person(s) Educated Patient    Methods Explanation     Comprehension Verbalized understanding              OT Short Term Goals - 02/11/22 2025       OT SHORT TERM GOAL #1   Title Pt will demonstrate understanding of the use of contrast for edema control.    Baseline mild edema at eval in left ring finger and palm    Time 4    Period Weeks    Status New    Target Date 03/05/22               OT Long Term Goals - 02/11/22 2027       OT LONG TERM GOAL #1   Title Pt will demonstrate HEP with modified independence    Baseline eval: no current program    Time 3    Period Weeks    Status New    Target Date 02/26/22      OT LONG TERM GOAL #2   Title Pt will cut food with modified independence    Baseline eval: difficulty with cutting meat    Time 6    Period Weeks    Status New    Target Date 03/19/22      OT LONG TERM GOAL #3   Title Pt will improve ROM of left hand and wrist to complete meal prep tasks with modified independence.    Baseline eval:  difficulty with  meal prep and holding items    Time 6    Period Weeks    Status New    Target Date 03/19/22      OT LONG TERM GOAL #4   Title Pt will demonstrate understanding of scar management techniques to decrease adhesions and promote healing of surgical scar.    Baseline eval: no knowledge    Time 4    Period Weeks    Status New    Target Date 03/05/22      OT LONG TERM GOAL #5   Title Pt will improve FOTO score to 66 or better to show a clinically relevant change to impact ADL and IADL tasks with greater independence.    Baseline Eval:  score of 43    Time 6    Period Weeks    Status New    Target Date 03/19/22                   Plan - 02/11/22 1951     Clinical Impression Statement Pt is a 56 yo male who underwent surgery on Nov 15th for left carpal tunnel release and excision Dupuytren's contracture involving the left ring finger.  Suture removal on 01/29/2022 and presents for OT evaluation this date, currently almost 3 weeks post op,  nonweight bearing to left hand and wrist.  Pt appears to be healing well, presents with decreased ROM of hand and wrist, surgical scars, decreased sensation decreased strength and coordination of left hand and wrist. Pt has a history of back and neck pain.  He will benefit from skilled OT services to maximize his safety and independence in necessary daily tasks at home and in the community.    OT Occupational Profile and History Detailed Assessment- Review of Records and additional review of physical, cognitive, psychosocial history related to current functional performance    Occupational performance deficits (Please refer to evaluation for details): ADL's;IADL's;Leisure    Body Structure / Function / Physical Skills ADL;Edema;Flexibility;ROM;UE functional use;FMC;Pain;Scar mobility;Strength;Coordination;Dexterity;IADL;Sensation    Psychosocial Skills Environmental  Adaptations;Routines and Behaviors;Habits    Rehab Potential Good    Clinical Decision Making Several treatment options, min-mod task modification necessary    Comorbidities Affecting Occupational Performance: May have comorbidities impacting occupational performance    Modification or Assistance to Complete Evaluation  No modification of tasks or assist necessary to complete eval    OT Frequency 2x / week    OT Duration 6 weeks    OT Treatment/Interventions Self-care/ADL training;Fluidtherapy;Therapeutic exercise;Scar mobilization;Therapeutic activities;Patient/family education;Splinting;Passive range of motion;Manual Therapy;DME and/or AE instruction;Neuromuscular education;Contrast Bath;Paraffin;Moist Heat;Cryotherapy    Consulted and Agree with Plan of Care Patient             Patient will benefit from skilled therapeutic intervention in order to improve the following deficits and impairments:   Body Structure / Function / Physical Skills: ADL, Edema, Flexibility, ROM, UE functional use, FMC, Pain, Scar mobility, Strength,  Coordination, Dexterity, IADL, Sensation   Psychosocial Skills: Environmental  Adaptations, Routines and Behaviors, Habits   Visit Diagnosis: Stiffness of left hand, not elsewhere classified  Muscle weakness (generalized)  Pain in left hand    Problem List Patient Active Problem List   Diagnosis Date Noted   Lumbar radicular pain 12/12/2021   Neuropathic pain 12/12/2021   Atherosclerosis of native artery of both lower extremities with intermittent claudication ([facility]) 12/12/2021   Pain management contract signed 12/12/2021   Encounter for long-term opiate analgesic use 12/12/2021   Chronic pain  syndrome 12/12/2021   Atherosclerosis of native arteries of extremity with intermittent claudication ([place]) 07/05/2021   DJD (degenerative joint disease) of cervical spine 07/05/2021   DJD (degenerative joint disease), lumbosacral 07/05/2021   Right inguinal hernia 06/22/2021   Bilateral carpal tunnel syndrome 05/09/2020   Hyperlipidemia 12/13/2017   Ejaculatory disorder 12/13/2017   PAD (peripheral artery disease) ([place]) 04/30/2017   Chronic distal aortic occlusion ([place]) 04/30/2017   Leg pain 04/30/2017   Low back pain 04/04/2017   Rectal pain 03/15/2017   Encounter for screening colonoscopy 03/15/2017   [name], OTR/L, CLT  [name], OT 02/11/2022, 8:32 PM  [facility] [address], [place], [address] Phone: [phone]   Fax:  [phone]  Name: [name] MRN: [id number] Date of Birth: 1965-11-20

## 2022-02-12 ENCOUNTER — Ambulatory Visit: Payer: 59 | Admitting: Occupational Therapy

## 2022-02-12 DIAGNOSIS — M25642 Stiffness of left hand, not elsewhere classified: Secondary | ICD-10-CM | POA: Diagnosis not present

## 2022-02-12 DIAGNOSIS — M6281 Muscle weakness (generalized): Secondary | ICD-10-CM

## 2022-02-12 DIAGNOSIS — M79642 Pain in left hand: Secondary | ICD-10-CM

## 2022-02-12 NOTE — Therapy (Signed)
[facility] [facility] [address] [place], [address] Phone: [phone]   Fax:  [phone]  Occupational Therapy Treatment  Patient Details  Name: [name] MRN: [id number] Date of Birth: 03-21-1965 Referring Provider (OT): Poggi   Encounter Date: 02/12/2022   OT End of Session - 02/12/22 1248     Visit Number 2    Number of Visits 12    Date for OT Re-Evaluation 03/19/22    OT Start Time 1201    OT Stop Time 1244    OT Time Calculation (min) 43 min    Activity Tolerance Patient tolerated treatment well    Behavior During Therapy [facility] for tasks assessed/performed             Past Medical History:  Diagnosis Date   Carpal tunnel syndrome of left wrist    DJD (degenerative joint disease)    History of kidney stones    Hyperlipidemia    Leg pain    Low back pain 04/04/2017   PAD (peripheral artery disease) (HCC)    Peripheral vascular disease (HCC)    Right inguinal hernia     Past Surgical History:  Procedure Laterality Date   AORTA - BILATERAL FEMORAL ARTERY BYPASS GRAFT N/A 05/22/2017   Procedure: AORTA BIFEMORAL BYPASS GRAFT, Aortic bilateral, and internal ischemia;  Surgeon: Renford Dills, MD;  Location: [facility] ORS;  Service: Vascular;  Laterality: N/A;   CENTRAL VENOUS CATHETER INSERTION Left 05/22/2017   Procedure: INSERTION CENTRAL LINE ADULT Left Internal Jugular;  Surgeon: Renford Dills, MD;  Location: [facility];  Service: Vascular;  Laterality: Left;   COLONOSCOPY WITH PROPOFOL N/A 03/27/2017   Procedure: COLONOSCOPY WITH PROPOFOL;  Surgeon: Earline Mayotte, MD;  Location: [facility] ENDOS[facility];  Service: Endoscopy;  Laterality: N/A;   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   Procedure: ENDOSCOPIC LEFT CARPAL TUNNEL RELEASE AND 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.;  Surgeon: Christena Flake, MD;  Location: [facility] ORS;  Service: Orthopedics;  Laterality:  Left;   INSERTION OF MESH Right 06/28/2021   Procedure: INSERTION OF MESH;  Surgeon: Campbell Lerner, MD;  Location: [facility];  Service: General;  Laterality: Right;   KIDNEY STONE SURGERY     KNEE SURGERY Left 2006   ROBOTIC ASSISTED LAPAROSCOPIC LYSIS OF ADHESION N/A 06/28/2021   Procedure: XI ROBOTIC ASSISTED LAPAROSCOPIC LYSIS OF ADHESION;  Surgeon: Campbell Lerner, MD;  Location: [facility] ORS;  Service: General;  Laterality: N/A;    There were no vitals filed for this visit.   Subjective Assessment - 02/12/22 1246     Subjective  Doing okay - pain still in my hand and wrist - towards my pinkie side of hand - did take pain medication -before that pain was 5/10    Pertinent History Per chart review from last MD visit note on 01/29/2022: [name] is a 56 y.o. male who presents today for a repeat skin check and suture removal status post a endoscopic left carpal tunnel release in addition to excision Dupuytren's contracture involving the left ring finger. The patient underwent surgery with Dr. Joice Lofts on 01/17/2022. He was initially evaluated on 01/22/2022 for a skin check which revealed no evidence of infection, and the incision was healing well without any complications at this time. The patient presents today for suture removal. Pain score today is a 4 out of 10. He denies any trauma or injury affecting left hand since his last evaluation. He has  been in the splint since his last evaluation removing only yesterday to shower and wash his forearm. He states that he has been able to keep the hand dry. He denies any numbness or tingling the left upper extremity at today's visit. He denies any purulent drainage or signs of infection at this time. He does take chronic pain medication prescribed by pain clinic. He has not been doing any formal therapy yet.    Patient Stated Goals Pt reports he would like to be able to use his hand better for daily tasks.    Currently in Pain? Yes    Pain Score 3      Pain Location --   hand and wrist   Pain Orientation Left    Pain Descriptors / Indicators Aching;Tightness;Sore    Pain Type Surgical pain    Pain Onset 1 to 4 weeks ago    Pain Frequency Intermittent                [facility] OT Assessment - 02/12/22 0001       AROM   Left Wrist Extension 50 Degrees    Left Wrist Flexion 75 Degrees      Left Hand AROM   L Index  MCP 0-90 80 Degrees    L Index PIP 0-100 90 Degrees    L Long  MCP 0-90 80 Degrees    L Long PIP 0-100 90 Degrees    L Ring  MCP 0-90 75 Degrees    L Ring PIP 0-100 90 Degrees    L Little  MCP 0-90 75 Degrees    L Little PIP 0-100 90 Degrees                      OT Treatments/Exercises (OP) - 02/12/22 0001       LUE Contrast Bath   Time 8 minutes    Comments hand /wrist prior to soft issue            Patient report was not able to do contrast at home.  Constantly doing motion and exercises. Patient pain increased as well as continuous edema in left hand and wrist. Reinforced importance of doing contrast at home with great results in session in decreasing pain and edema prior to scar massage and range of motion. Reviewed with patient scar massage and mobilization as well as using Cica -Care scar pad at nighttime.  With a Tubigrip D for compression Reinforced with patient also enlarging grips using palms to pick up objects. Patient had increased pain and symptoms on the ulnar side of the hand and wrist to forearm. Upon further assessment patient had positive Tinel and tenderness and pain over cubital tunnel. Reviewed with patient sleeping habits as well as cradling hand at chest.  Decreasing compression at the cubital tunnel on the ulnar nerve. Reviewed with patient after contrast at home 3 times a day to do tendon glides pain-free. As well as wrist active assisted range of motion for flexion and extension. 10 reps Handout for scar massage provided.        OT Education - 02/12/22 1248      Education Details progress and  changes to HEP    Person(s) Educated Patient    Methods Explanation;Demonstration;Tactile cues;Verbal cues;Handout    Comprehension Verbalized understanding              OT Short Term Goals - 02/11/22 2025       OT SHORT TERM GOAL #1  Title Pt will demonstrate understanding of the use of contrast for edema control.    Baseline mild edema at eval in left ring finger and palm    Time 4    Period Weeks    Status New    Target Date 03/05/22               OT Long Term Goals - 02/11/22 2027       OT LONG TERM GOAL #1   Title Pt will demonstrate HEP with modified independence    Baseline eval: no current program    Time 3    Period Weeks    Status New    Target Date 02/26/22      OT LONG TERM GOAL #2   Title Pt will cut food with modified independence    Baseline eval: difficulty with cutting meat    Time 6    Period Weeks    Status New    Target Date 03/19/22      OT LONG TERM GOAL #3   Title Pt will improve ROM of left hand and wrist to complete meal prep tasks with modified independence.    Baseline eval:  difficulty with meal prep and holding items    Time 6    Period Weeks    Status New    Target Date 03/19/22      OT LONG TERM GOAL #4   Title Pt will demonstrate understanding of scar management techniques to decrease adhesions and promote healing of surgical scar.    Baseline eval: no knowledge    Time 4    Period Weeks    Status New    Target Date 03/05/22      OT LONG TERM GOAL #5   Title Pt will improve FOTO score to 66 or better to show a clinically relevant change to impact ADL and IADL tasks with greater independence.    Baseline Eval:  score of 43    Time 6    Period Weeks    Status New    Target Date 03/19/22                   Plan - 02/12/22 1249     Clinical Impression Statement Pt is a 56 yo male who underwent surgery on Nov 15th for left carpal tunnel release and excision Dupuytren's  contracture involving the left ring finger.  Suture removal on 01/29/2022. Pt is 3 1/2 wks s/p - nonweight bearing to left hand and wrist.  Pt appears to be healing well - cont to sohw increase pain with decreased ROM of hand and wrist, surgical scars, decreased sensation, decreased strength and coordination of left hand and wrist. Pt has a history of back and neck pain. Appear to have some cubital tunnel symptoms still- positive for Tinel and pain at Cubital tunnel - pt ed on sleeping and modifications to tasks /habits.  He will benefit from skilled OT services to decrease edema, pain and scar tissue - increase ROM and strength  to maximize his safety and independence in necessary daily tasks at home and in the community.    OT Occupational Profile and History Detailed Assessment- Review of Records and additional review of physical, cognitive, psychosocial history related to current functional performance    Occupational performance deficits (Please refer to evaluation for details): ADL's;IADL's;Leisure    Body Structure / Function / Physical Skills ADL;Edema;Flexibility;ROM;UE functional use;FMC;Pain;Scar mobility;Strength;Coordination;Dexterity;IADL;Sensation    Psychosocial Skills Environmental  Adaptations;Routines and  Behaviors;Habits    Rehab Potential Good    Clinical Decision Making Several treatment options, min-mod task modification necessary    Comorbidities Affecting Occupational Performance: May have comorbidities impacting occupational performance    Modification or Assistance to Complete Evaluation  No modification of tasks or assist necessary to complete eval    OT Frequency 2x / week    OT Duration 6 weeks    OT Treatment/Interventions Self-care/ADL training;Fluidtherapy;Therapeutic exercise;Scar mobilization;Therapeutic activities;Patient/family education;Splinting;Passive range of motion;Manual Therapy;DME and/or AE instruction;Neuromuscular education;Contrast Bath;Paraffin;Moist  Heat;Cryotherapy    Consulted and Agree with Plan of Care Patient             Patient will benefit from skilled therapeutic intervention in order to improve the following deficits and impairments:   Body Structure / Function / Physical Skills: ADL, Edema, Flexibility, ROM, UE functional use, FMC, Pain, Scar mobility, Strength, Coordination, Dexterity, IADL, Sensation   Psychosocial Skills: Environmental  Adaptations, Routines and Behaviors, Habits   Visit Diagnosis: Stiffness of left hand, not elsewhere classified  Muscle weakness (generalized)  Pain in left hand    Problem List Patient Active Problem List   Diagnosis Date Noted   Lumbar radicular pain 12/12/2021   Neuropathic pain 12/12/2021   Atherosclerosis of native artery of both lower extremities with intermittent claudication (HCC) 12/12/2021   Pain management contract signed 12/12/2021   Encounter for long-term opiate analgesic use 12/12/2021   Chronic pain syndrome 12/12/2021   Atherosclerosis of native arteries of extremity with intermittent claudication (HCC) 07/05/2021   DJD (degenerative joint disease) of cervical spine 07/05/2021   DJD (degenerative joint disease), lumbosacral 07/05/2021   Right inguinal hernia 06/22/2021   Bilateral carpal tunnel syndrome 05/09/2020   Hyperlipidemia 12/13/2017   Ejaculatory disorder 12/13/2017   PAD (peripheral artery disease) (HCC) 04/30/2017   Chronic distal aortic occlusion (HCC) 04/30/2017   Leg pain 04/30/2017   Low back pain 04/04/2017   Rectal pain 03/15/2017   Encounter for screening colonoscopy 03/15/2017    Oletta CohnuPreez, Peola Joynt, OTR/L,CLT 02/12/2022, 12:53 PM  [facility] [address] [place], [place], [address] Phone: [phone]   Fax:  [phone]  Name: [name] MRN: [id number] Date of Birth: 1966-01-01

## 2022-02-16 ENCOUNTER — Ambulatory Visit: Payer: 59 | Admitting: Occupational Therapy

## 2022-02-16 DIAGNOSIS — M25642 Stiffness of left hand, not elsewhere classified: Secondary | ICD-10-CM | POA: Diagnosis not present

## 2022-02-16 DIAGNOSIS — M79642 Pain in left hand: Secondary | ICD-10-CM

## 2022-02-16 DIAGNOSIS — M6281 Muscle weakness (generalized): Secondary | ICD-10-CM

## 2022-02-16 NOTE — Therapy (Signed)
[place] [facility] [address] [place], [place], [address] Phone: [phone]   Fax:  [phone]  Occupational Therapy Treatment  Patient Details  Name: Marcellis Frampton MRN: [id number] Date of Birth: Sep 20, 1965 Referring Provider (OT): Poggi   Encounter Date: 02/16/2022   OT End of Session - 02/16/22 1034     Visit Number 3    Number of Visits 12    Date for OT Re-Evaluation 03/19/22    OT Start Time 1032    OT Stop Time 1110    OT Time Calculation (min) 38 min    Activity Tolerance Patient tolerated treatment well    Behavior During Therapy [facility] for tasks assessed/performed             Past Medical History:  Diagnosis Date   Carpal tunnel syndrome of left wrist    DJD (degenerative joint disease)    History of kidney stones    Hyperlipidemia    Leg pain    Low back pain 04/04/2017   PAD (peripheral artery disease) (HCC)    Peripheral vascular disease (HCC)    Right inguinal hernia     Past Surgical History:  Procedure Laterality Date   AORTA - BILATERAL FEMORAL ARTERY BYPASS GRAFT N/A 05/22/2017   Procedure: AORTA BIFEMORAL BYPASS GRAFT, Aortic bilateral, and internal ischemia;  Surgeon: Renford Dills, MD;  Location: [facility];  Service: Vascular;  Laterality: N/A;   CENTRAL VENOUS CATHETER INSERTION Left 05/22/2017   Procedure: INSERTION CENTRAL LINE ADULT Left Internal Jugular;  Surgeon: Renford Dills, MD;  Location: [facility];  Service: Vascular;  Laterality: Left;   COLONOSCOPY WITH PROPOFOL N/A 03/27/2017   Procedure: COLONOSCOPY WITH PROPOFOL;  Surgeon: Earline Mayotte, MD;  Location: [facility];  Service: Endoscopy;  Laterality: N/A;   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   Procedure: ENDOSCOPIC LEFT CARPAL TUNNEL RELEASE AND 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.;  Surgeon: Christena Flake, MD;  Location: [facility];  Service: Orthopedics;  Laterality:  Left;   INSERTION OF MESH Right 06/28/2021   Procedure: INSERTION OF MESH;  Surgeon: Campbell Lerner, MD;  Location: [facility];  Service: General;  Laterality: Right;   KIDNEY STONE SURGERY     KNEE SURGERY Left 2006   ROBOTIC ASSISTED LAPAROSCOPIC LYSIS OF ADHESION N/A 06/28/2021   Procedure: XI ROBOTIC ASSISTED LAPAROSCOPIC LYSIS OF ADHESION;  Surgeon: Campbell Lerner, MD;  Location: [facility];  Service: General;  Laterality: N/A;    There were no vitals filed for this visit.   Subjective Assessment - 02/16/22 1034     Subjective  I don't know if I will be in town next week - Doing better - my hand still feels tight and swollen when making fist - and on this side of my hand at this bone it is really tender    Pertinent History Per chart review from last MD visit note on 01/29/2022: Azhar Knope is a 56 y.o. male who presents today for a repeat skin check and suture removal status post a endoscopic left carpal tunnel release in addition to excision Dupuytren's contracture involving the left ring finger. The patient underwent surgery with Dr. Joice Lofts on 01/17/2022. He was initially evaluated on 01/22/2022 for a skin check which revealed no evidence of infection, and the incision was healing well without any complications at this time. The patient presents today for suture removal. Pain score today is a 4 out of 10. He denies  any trauma or injury affecting left hand since his last evaluation. He has been in the splint since his last evaluation removing only yesterday to shower and wash his forearm. He states that he has been able to keep the hand dry. He denies any numbness or tingling the left upper extremity at today's visit. He denies any purulent drainage or signs of infection at this time. He does take chronic pain medication prescribed by pain clinic. He has not been doing any formal therapy yet.    Patient Stated Goals Pt reports he would like to be able to use his hand better for daily  tasks.    Currently in Pain? Yes    Pain Score 1     Pain Location Hand    Pain Orientation Left    Pain Descriptors / Indicators Aching;Tightness    Pain Type Surgical pain    Pain Onset 1 to 4 weeks ago    Pain Frequency Intermittent                [facility] OT Assessment - 02/16/22 0001       Right Hand AROM   R Index  MCP 0-90 80 Degrees    R Index PIP 0-100 95 Degrees    R Long  MCP 0-90 90 Degrees    R Long PIP 0-100 95 Degrees    R Ring  MCP 0-90 85 Degrees    R Ring PIP 0-100 100 Degrees    R Little  MCP 0-90 85 Degrees    R Little PIP 0-100 95 Degrees            Full extention of digits - pt with tenderness over guyon's canal - pisiforme  Pt to not over do HEP with squeezing  and constant- only motion for the next 2 wks -and sleeping habits - not in fist sleep or elbow flex  And use contrast to decrease edema in [facility] and [facility]           OT Treatments/Exercises (OP) - 02/16/22 0001       LUE Contrast Bath   Time 8 minutes    Comments hand and wrist prior to scar massage and ROM              Reinforced importance of doing contrast at home with great results in session in decreasing pain and edema prior to scar massage and range of motion. Reviewed with patient scar massage and mobilization as well as using Cica -Care scar pad at nighttime.  With a Tubigrip D for compression Reinforced with patient also enlarging grips using palms to pick up objects. Patient had increased pain and symptoms on the ulnar side of the hand and wrist to forearm reported last time. Cont to have positive Tinel and tenderness and pain over cubital tunnel. Reviewed with patient sleeping habits as well as cradling hand at chest.  Decreasing compression at the cubital tunnel on the ulnar nerve.  Reviewed with patient after contrast at home 3 times a day to do tendon glides pain-free. Motion only - not squeezing tight As well as wrist active assisted range of motion for flexion  and extension. Add light prayer stretch  10 reps Handout for scar massage provided last time            OT Education - 02/16/22 1034     Education Details progress and  changes to HEP    Person(s) Educated Patient    Methods Explanation;Demonstration;Tactile cues;Verbal cues;Handout  Comprehension Verbalized understanding              OT Short Term Goals - 02/11/22 2025       OT SHORT TERM GOAL #1   Title Pt will demonstrate understanding of the use of contrast for edema control.    Baseline mild edema at eval in left ring finger and palm    Time 4    Period Weeks    Status New    Target Date 03/05/22               OT Long Term Goals - 02/11/22 2027       OT LONG TERM GOAL #1   Title Pt will demonstrate HEP with modified independence    Baseline eval: no current program    Time 3    Period Weeks    Status New    Target Date 02/26/22      OT LONG TERM GOAL #2   Title Pt will cut food with modified independence    Baseline eval: difficulty with cutting meat    Time 6    Period Weeks    Status New    Target Date 03/19/22      OT LONG TERM GOAL #3   Title Pt will improve ROM of left hand and wrist to complete meal prep tasks with modified independence.    Baseline eval:  difficulty with meal prep and holding items    Time 6    Period Weeks    Status New    Target Date 03/19/22      OT LONG TERM GOAL #4   Title Pt will demonstrate understanding of scar management techniques to decrease adhesions and promote healing of surgical scar.    Baseline eval: no knowledge    Time 4    Period Weeks    Status New    Target Date 03/05/22      OT LONG TERM GOAL #5   Title Pt will improve FOTO score to 66 or better to show a clinically relevant change to impact ADL and IADL tasks with greater independence.    Baseline Eval:  score of 43    Time 6    Period Weeks    Status New    Target Date 03/19/22                   Plan - 02/16/22 1035      Clinical Impression Statement Pt is a 56 yo male who underwent surgery on Nov 15th for left carpal tunnel release and excision Dupuytren's contracture involving the left ring finger.  Suture removal on 01/29/2022. Pt is 4 wks s/p - nonweight bearing to left hand and wrist.  Pt made progress in pain , scar healing and AROM since last time. Pt has a history of back and neck pain. And take pain medication for that.  Extention WNL and composite flexion increase - pt cont to have cubital tunnel symptoms- positive for Tinel and pain at Cubital tunnel - As well as report this date tenderness over Guyons canal. Pt ed on sleeping and modifications to tasks /habits. As well as not to over do HEP and use contrast , scar pad and scar massage with ROM focus not strength for the next 2 wks. Verbalize understanding -Pt can benefit from skilled OT services to decrease edema, pain and scar tissue - increase ROM and strength  to maximize his safety and independence in necessary daily tasks at  home and in the community. Pt will be out of town will follow up as needed.    OT Occupational Profile and History Detailed Assessment- Review of Records and additional review of physical, cognitive, psychosocial history related to current functional performance    Occupational performance deficits (Please refer to evaluation for details): ADL's;IADL's;Leisure    Body Structure / Function / Physical Skills ADL;Edema;Flexibility;ROM;UE functional use;FMC;Pain;Scar mobility;Strength;Coordination;Dexterity;IADL;Sensation    Psychosocial Skills Environmental  Adaptations;Routines and Behaviors;Habits    Rehab Potential Good    Clinical Decision Making Several treatment options, min-mod task modification necessary    Comorbidities Affecting Occupational Performance: May have comorbidities impacting occupational performance    Modification or Assistance to Complete Evaluation  No modification of tasks or assist necessary to complete eval     OT Frequency 2x / week    OT Duration 6 weeks    OT Treatment/Interventions Self-care/ADL training;Fluidtherapy;Therapeutic exercise;Scar mobilization;Therapeutic activities;Patient/family education;Splinting;Passive range of motion;Manual Therapy;DME and/or AE instruction;Neuromuscular education;Contrast Bath;Paraffin;Moist Heat;Cryotherapy    Consulted and Agree with Plan of Care Patient             Patient will benefit from skilled therapeutic intervention in order to improve the following deficits and impairments:   Body Structure / Function / Physical Skills: ADL, Edema, Flexibility, ROM, UE functional use, FMC, Pain, Scar mobility, Strength, Coordination, Dexterity, IADL, Sensation   Psychosocial Skills: Environmental  Adaptations, Routines and Behaviors, Habits   Visit Diagnosis: Stiffness of left hand, not elsewhere classified  Muscle weakness (generalized)  Pain in left hand    Problem List Patient Active Problem List   Diagnosis Date Noted   Lumbar radicular pain 12/12/2021   Neuropathic pain 12/12/2021   Atherosclerosis of native artery of both lower extremities with intermittent claudication (HCC) 12/12/2021   Pain management contract signed 12/12/2021   Encounter for long-term opiate analgesic use 12/12/2021   Chronic pain syndrome 12/12/2021   Atherosclerosis of native arteries of extremity with intermittent claudication (HCC) 07/05/2021   DJD (degenerative joint disease) of cervical spine 07/05/2021   DJD (degenerative joint disease), lumbosacral 07/05/2021   Right inguinal hernia 06/22/2021   Bilateral carpal tunnel syndrome 05/09/2020   Hyperlipidemia 12/13/2017   Ejaculatory disorder 12/13/2017   PAD (peripheral artery disease) (HCC) 04/30/2017   Chronic distal aortic occlusion (HCC) 04/30/2017   Leg pain 04/30/2017   Low back pain 04/04/2017   Rectal pain 03/15/2017   Encounter for screening colonoscopy 03/15/2017    Oletta CohnuPreez, Zayquan Bogard,  OTR/L,CLT 02/16/2022, 11:30 AM  [place] [facility] [address] [place], [place], [address] Phone: [phone]   Fax:  [phone]  Name: Jacquenette ShoneFarzad Sawaya MRN: [id number] Date of Birth: 06-Feb-1966

## 2022-02-20 ENCOUNTER — Ambulatory Visit: Payer: 59 | Admitting: Occupational Therapy

## 2022-02-23 ENCOUNTER — Ambulatory Visit: Payer: 59 | Admitting: Occupational Therapy

## 2022-03-06 ENCOUNTER — Ambulatory Visit
Payer: Medicaid Other | Attending: Student in an Organized Health Care Education/Training Program | Admitting: Student in an Organized Health Care Education/Training Program

## 2022-03-06 ENCOUNTER — Encounter: Payer: Self-pay | Admitting: Student in an Organized Health Care Education/Training Program

## 2022-03-06 VITALS — BP 103/77 | HR 72 | Temp 97.1°F | Resp 16 | Ht 65.0 in | Wt 170.0 lb

## 2022-03-06 DIAGNOSIS — M5416 Radiculopathy, lumbar region: Secondary | ICD-10-CM

## 2022-03-06 DIAGNOSIS — I739 Peripheral vascular disease, unspecified: Secondary | ICD-10-CM | POA: Diagnosis present

## 2022-03-06 DIAGNOSIS — M5412 Radiculopathy, cervical region: Secondary | ICD-10-CM | POA: Diagnosis present

## 2022-03-06 DIAGNOSIS — G894 Chronic pain syndrome: Secondary | ICD-10-CM | POA: Diagnosis present

## 2022-03-06 DIAGNOSIS — Z79891 Long term (current) use of opiate analgesic: Secondary | ICD-10-CM | POA: Insufficient documentation

## 2022-03-06 DIAGNOSIS — Z0289 Encounter for other administrative examinations: Secondary | ICD-10-CM | POA: Diagnosis present

## 2022-03-06 DIAGNOSIS — I70213 Atherosclerosis of native arteries of extremities with intermittent claudication, bilateral legs: Secondary | ICD-10-CM | POA: Insufficient documentation

## 2022-03-06 DIAGNOSIS — M792 Neuralgia and neuritis, unspecified: Secondary | ICD-10-CM | POA: Insufficient documentation

## 2022-03-06 MED ORDER — HYDROCODONE-ACETAMINOPHEN 5-325 MG PO TABS: 1.0000 | ORAL_TABLET | Freq: Four times a day (QID) | ORAL | 0 refills | Status: AC | PRN

## 2022-03-06 MED ORDER — HYDROCODONE-ACETAMINOPHEN 5-325 MG PO TABS: 1.0000 | ORAL_TABLET | Freq: Four times a day (QID) | ORAL | 0 refills | Status: DC | PRN

## 2022-03-06 NOTE — Progress Notes (Signed)
PROVIDER NOTE: Information contained herein reflects review and annotations entered in association with encounter. Interpretation of such information and data should be left to medically-trained personnel. Information provided to patient can be located elsewhere in the medical record under "Patient Instructions". Document created using STT-dictation technology, any transcriptional errors that may result from process are unintentional.    Patient: [name]  Service Category: E/M  Provider: Gillis Santa, MD  DOB: 02-09-1966  DOS: 03/06/2022  Referring Provider: Casilda Carls, MD  MRN: [id number]  Specialty: Interventional Pain Management  PCP: Casilda Carls, MD  Type: Established Patient  Setting: Ambulatory outpatient    Location: Office  Delivery: Face-to-face     HPI  Mr. Jaxton Casale, a 57 y.o. year old male, is here today because of his Cervical radicular pain [M54.12]. Mr. Carmean primary complain today is Back Pain (Lumbar midline ), Shoulder Pain (Right ), Neck Pain, and Hand Pain (Left s/p surgery limited ROM or use ) Last encounter: My last encounter with him was on 01/04/2022 Pertinent problems: Mr. Mabe has Chronic distal aortic occlusion ([place]); Neuropathic pain; Atherosclerosis of native artery of both lower extremities with intermittent claudication ([place]); Pain management contract signed; Encounter for long-term opiate analgesic use; and Chronic pain syndrome on their pertinent problem list. Pain Assessment: Severity of Chronic pain is reported as a 4 /10. Location: Back (see visit info for all pain sites.) Lower/? neck pain into right shoulder. Onset: More than a month ago. Quality: Discomfort, Constant, Pressure. Timing: Constant. Modifying factor(s): heat and medications. Vitals:  height is _0  (1.651 m) and weight is 170 lb (77.1 kg). His temporal temperature is 97.1 F (36.2 C) (abnormal). His blood pressure is 103/77 and his pulse is 72. His respiration is  16 and oxygen saturation is 99%.   Reason for encounter: medication management.  As well as bilateral leg pain worse with walking and exertion.  Patient is status post left carpal tunnel release with Dr. Arnette Schaumann on 01/17/2022.  He is still dealing with left hand pain but is hopeful that it will improve as he is working with occupational therapy.  His CT angiogram was denied which I ordered. Patient is endorsing cervical spine pain with radiation into his right shoulder and right bicep in a dermatomal fashion.  He states that this was previously going on for the left side but now has shifted to the right.  Cervical MRI does reveal foraminal disc protrusion at C6-C7 with mild to moderate spinal canal stenosis at higher levels including C3-C4, C4-C5 and C5-C6.  We discussed a right cervical epidural steroid injection.  Risk and benefits reviewed and patient would like to proceed with that.   01/04/22 Patient presents today for medication management.  He is noticing mild analgesic and functional benefit with hydrocodone 5 mg every 8 hours as needed.  He states that his pain is severe in the evenings and he would like an extra dose that he can take for when that happens.  He is also complaining of increased lower extremity pain and weakness associated with activity and exertion consistent with vascular claudication.  Less likely that this is coming from a spinal source as the patient has had a lumbar MRI which does not show any significant central canal stenosis.  Overall his lumbar MRI shows mild neuroforaminal narrowing at left L3-4 and on the right at L4-L5 with disc degeneration at L1-L2. Patient is requesting a repeat CT angiogram as he is convinced that his pain is coming from a  vascular cause.  I also agree that this is higher on the differential than a spinal issue.  Especially in the context of worsening pain with exertion and aortic bypass He has a a procedure next week for left carpal tunnel release. He  is under a lot of stress and is having difficulty sleeping at night.  Pharmacotherapy Assessment  Analgesic: Hydrocodone 5 mg every 6 hours as needed, quantity 120/month, MME equals 20  Monitoring: [place] PMP: PDMP reviewed during this encounter.       Pharmacotherapy: No side-effects or adverse reactions reported. Compliance: No problems identified. Effectiveness: Clinically acceptable.  Janett Billow, RN  03/06/2022 11:30 AM  Sign when Signing Visit Nursing Pain Medication Assessment:  Safety precautions to be maintained throughout the outpatient stay will include: orient to surroundings, keep bed in [name] position, maintain call bell within reach at all times, provide assistance with transfer out of bed and ambulation.  Medication Inspection Compliance: Mr. Moehring did not comply with our request to bring his pills to be counted. He was reminded that bringing the medication bottles, even when empty, is a requirement.  Medication: None brought in. Pill/Patch Count: None available to be counted. Bottle Appearance: No container available. Did not bring bottle(s) to appointment. Filled Date: N/A Last Medication intake:  Today    No results found for: "CBDTHCR" No results found for: "D8THCCBX" No results found for: "D9THCCBX"  UDS:  Summary  Date Value Ref Range Status  11/21/2021 Note  Final    Comment:    ==================================================================== Compliance Drug Analysis, Ur ==================================================================== Test                             Result       Flag       Units  Drug Present and Declared for Prescription Verification   Acetaminophen                  PRESENT      EXPECTED   Propranolol                    PRESENT      EXPECTED  Drug Present not Declared for Prescription Verification   Gabapentin                     PRESENT      UNEXPECTED  Drug Absent but Declared for Prescription Verification    Pregabalin                     Not Detected UNEXPECTED   Salicylate                     Not Detected UNEXPECTED    Aspirin, as indicated in the declared medication list, is not always    detected even when used as directed.    Ibuprofen                      Not Detected UNEXPECTED    Ibuprofen, as indicated in the declared medication list, is not    always detected even when used as directed.  ==================================================================== Test                      Result    Flag   Units      Ref Range   Creatinine  120              mg/dL      >=20 ==================================================================== Declared Medications:  The flagging and interpretation on this report are based on the  following declared medications.  Unexpected results may arise from  inaccuracies in the declared medications.   **Note: The testing scope of this panel includes these medications:   Pregabalin (Lyrica)  Propranolol (Inderal)   **Note: The testing scope of this panel does not include small to  moderate amounts of these reported medications:   Acetaminophen (Tylenol)  Aspirin  Ibuprofen (Advil)   **Note: The testing scope of this panel does not include the  following reported medications:   Rosuvastatin (Crestor) ==================================================================== For clinical consultation, please call [phone]. ====================================================================       ROS  Constitutional: Denies any fever or chills Gastrointestinal: No reported hemesis, hematochezia, vomiting, or acute GI distress Musculoskeletal: Cervical spine pain with radiation into right shoulder and right bicep Neurological: No reported episodes of acute onset apraxia, aphasia, dysarthria, agnosia, amnesia, paralysis, loss of coordination, or loss of consciousness  Medication Review  HYDROcodone-acetaminophen, acetaminophen,  aspirin EC, ibuprofen, propranolol, and rosuvastatin  History Review  Allergy: Mr. Thieme has No Known Allergies. Drug: Mr. Shieh  reports no history of drug use. Alcohol:  reports no history of alcohol use. Tobacco:  reports that he has been smoking cigarettes. He has a 7.50 pack-year smoking history. He has been exposed to tobacco smoke. He has never used smokeless tobacco. Social: Mr. Embleton  reports that he has been smoking cigarettes. He has a 7.50 pack-year smoking history. He has been exposed to tobacco smoke. He has never used smokeless tobacco. He reports that he does not drink alcohol and does not use drugs. Medical:  has a past medical history of Carpal tunnel syndrome of left wrist, DJD (degenerative joint disease), History of kidney stones, Hyperlipidemia, Leg pain, [name] (04/04/2017), PAD (peripheral artery disease) ([place]), Peripheral vascular disease ([place]), and Right inguinal hernia. Surgical: Mr. 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 Family: family history includes Alzheimer's disease in his father; Diabetes in his mother; Heart disease in his mother.  Laboratory Chemistry Profile   Renal Lab Results  Component Value Date   BUN 16 11/21/2021   CREATININE 0.84 11/21/2021   BCR 19 11/21/2021   GFRAA >60 05/27/2017   GFRNONAA >60 06/27/2021    Hepatic Lab Results  Component Value Date   AST 27 11/21/2021   ALT 43 06/27/2021   ALBUMIN 4.9 11/21/2021   ALKPHOS 81 11/21/2021    Electrolytes Lab Results  Component Value Date   NA 142 11/21/2021   K 4.5 11/21/2021   CL  103 11/21/2021   CALCIUM 10.1 11/21/2021   MG 2.1 11/21/2021   PHOS 3.9 05/28/2017    Bone Lab Results  Component Value Date   25OHVITD1 34 11/21/2021   25OHVITD2 <1.0 11/21/2021   25OHVITD3 34 11/21/2021    Inflammation (CRP: Acute Phase) (ESR: Chronic Phase) No results found for: "CRP", "ESRSEDRATE", "LATICACIDVEN"       Note: Above Lab results reviewed.  Recent Imaging Review  MR LUMBAR SPINE WO CONTRAST CLINICAL DATA:  Lumbar radiculopathy. Chronic left-sided [name] back  pain with bilateral sciatica. Additional history provided by scanning technologist: Patient reports worsening chronic [name]. Pain in central and left greater than right [name] back. Buttock, hip and leg pain.  EXAM: MRI LUMBAR SPINE WITHOUT CONTRAST  TECHNIQUE: Multiplanar, multisequence MR imaging of the lumbar spine was performed. No intravenous contrast was administered.  COMPARISON:  CTA abdomen/pelvis of 04/26/2017.  FINDINGS: Segmentation: 5 lumbar vertebrae. The caudal most well-formed intervertebral disc space is designated L5-S1.  Alignment: 2 mm L1-L2 grade 1 retrolisthesis. T trace grade 1 retrolisthesis at L4-L5 and L5-S1. Race L5-S1 grade 1 retrolisthesis.  Vertebrae: No lumbar vertebral compression fracture. No significant marrow edema or focal suspicious osseous lesion.  Conus medullaris and cauda equina: Conus extends to the L2 level. No signal abnormality within the visualized distal spinal cord.  Paraspinal and other soft tissues: Left renal sinus cysts. No paraspinal mass or collection.  Disc levels:  Mild-to-moderate disc degeneration at L1-L2. No more than mild disc degeneration at the remaining levels.  T12-L1: Moderate facet arthrosis. No significant disc herniation or stenosis.  L1-L2: 2 mm grade 1 retrolisthesis. Disc bulge. Mild facet arthrosis. Minimal effacement of the ventral thecal sac. No significant foraminal stenosis.  L2-L3: Mild facet arthrosis.  No significant disc herniation or stenosis.  L3-L4: Small disc bulge. Moderate facet arthrosis. Ligamentum flavum hypertrophy on the left. Mild relative left subarticular narrowing (without nerve root impingement). Central canal patent. Mild left inferior neural foraminal narrowing.  L4-L5: Trace grade 1 retrolisthesis. Disc bulge with endplate spurring. Central posterior annular fissure. Mild facet arthrosis. Mild right greater than left subarticular narrowing (without appreciable nerve root impingement) (series 8, image 29). Minimal relative narrowing of the central canal. Mild right neural foraminal narrowing.  L5-S1: Trace grade 1 retrolisthesis. Trace grade 1 retrolisthesis. Small disc bulge. Central posterior annular fissure. Mild facet arthrosis. No significant spinal canal stenosis or neural foraminal narrowing.  IMPRESSION: Lumbar spondylosis, as outlined.  Mild subarticular narrowing on the left at L3-L4 and bilaterally at L4-L5. There is also minimal relative narrowing of the central canal at L4-L5.  Mild neural foraminal narrowing on the left at L3-L4 and on the right at L4-L5.  Disc degeneration is greatest at L1-L2 (mild-to-moderate at this level).  Mild grade 1 retrolisthesis at L1-L2, L4-L5 and L5-S1.  Electronically Signed   By: Kellie Simmering D.O.   On: 08/31/2021 08:51  CLINICAL DATA:  Left neck pain radiating down the left arm with weakness.   EXAM: MRI CERVICAL SPINE WITHOUT CONTRAST   TECHNIQUE: Multiplanar, multisequence MR imaging of the cervical spine was performed. No intravenous contrast was administered.   COMPARISON:  Cervical spine radiographs 06/13/2021   FINDINGS: Alignment: Normal.   Vertebrae: No fracture, suspicious marrow lesion, or significant marrow edema.   Cord: Normal signal.   Posterior Fossa, vertebral arteries, paraspinal tissues: Unremarkable.   Disc levels:   C2-3: Negative.   C3-4: A small central disc  protrusion in moderate spinal stenosis, mildly indenting the ventral spinal cord. There is mild disc bulging and uncovertebral spurring without significant neural foraminal stenosis.   C4-5: Disc bulging and uncovertebral spurring result in mild spinal stenosis and mild right neural foraminal stenosis.   C5-6: Moderate disc space narrowing. Disc bulging and uncovertebral spurring result in mild spinal stenosis and moderate right and mild-to-moderate left neural foraminal stenosis.   C6-7: A small central disc protrusion slightly indents the ventral spinal cord without significant spinal stenosis. Disc bulging, uncovertebral spurring, and a left foraminal disc protrusion  result in moderate left neural foraminal stenosis with potential left C7 nerve root impingement.   C7-T1: Mild facet arthrosis without stenosis.   IMPRESSION: 1. Left foraminal disc protrusion at C6-7 with moderate left neural foraminal stenosis and potential left C7 nerve root impingement. 2. Moderate spinal stenosis at C3-4. 3. Mild spinal stenosis at C4-5 and C5-6.      Note: Reviewed        Physical Exam  General appearance: Well nourished, well developed, and well hydrated. In no apparent acute distress Mental status: Alert, oriented x 3 (person, place, & time)       Respiratory: No evidence of acute respiratory distress Eyes: PERLA Vitals: BP 103/77 (BP Location: Right Arm, Patient Position: Sitting, Cuff Size: Normal)   Pulse 72   Temp (!) 97.1 F (36.2 C) (Temporal)   Resp 16   Ht _0  (1.651 m)   Wt 170 lb (77.1 kg)   SpO2 99%   BMI 28.29 kg/m  BMI: Estimated body mass index is 28.29 kg/m as calculated from the following:   Height as of this encounter: _1  (1.651 m).   Weight as of this encounter: 170 lb (77.1 kg). Ideal: Ideal body weight: 61.5 kg (135 lb 9.3 oz) Adjusted ideal body weight: 67.7 kg (149 lb 5.6 oz)  Cervical Spine Area Exam  Skin & Axial Inspection: No masses, redness,  edema, swelling, or associated skin lesions Alignment: Symmetrical Functional ROM: Pain restricted ROM, to the left, positive Spurling's right Stability: No instability detected Muscle Tone/Strength: Functionally intact. No obvious neuro-muscular anomalies detected. Sensory (Neurological): Dermatomal pain pattern right C5-C6 Palpation: No palpable anomalies             Upper Extremity (UE) Exam    Side: Right upper extremity  Side: Left upper extremity  Skin & Extremity Inspection: Skin color, temperature, and hair growth are WNL. No peripheral edema or cyanosis. No masses, redness, swelling, asymmetry, or associated skin lesions. No contractures.  Skin & Extremity Inspection: Skin color, temperature, and hair growth are WNL. No peripheral edema or cyanosis. No masses, redness, swelling, asymmetry, or associated skin lesions. No contractures.  Functional ROM: Pain restricted ROM for shoulder and elbow  Functional ROM: Unrestricted ROM          Muscle Tone/Strength: Functionally intact. No obvious neuro-muscular anomalies detected.  Muscle Tone/Strength: Functionally intact. No obvious neuro-muscular anomalies detected.  Sensory (Neurological): Unimpaired          Sensory (Neurological): Unimpaired          Palpation: No palpable anomalies              Palpation: No palpable anomalies              Provocative Test(s):  Phalen's test: deferred Tinel's test: deferred Apley's scratch test (touch opposite shoulder):  Action 1 (Across chest): Decreased ROM Action 2 (Overhead): Decreased ROM Action 3 (LB reach): Decreased ROM   Provocative Test(s):  Phalen's test: deferred Tinel's test: deferred Apley's scratch test (touch opposite shoulder):  Action 1 (Across chest): deferred Action 2 (Overhead): deferred Action 3 (LB reach): deferred      Lumbar Spine Area Exam  Skin & Axial Inspection: No masses, redness, or swelling Alignment: Symmetrical Functional ROM: Pain restricted ROM        Stability: No instability detected Muscle Tone/Strength: Functionally intact. No obvious neuro-muscular anomalies detected. Sensory (Neurological): Musculoskeletal pain pattern Palpation: No palpable anomalies       Provocative Tests: Hyperextension/rotation test: (+)  bilaterally for facet joint pain. Lumbar quadrant test (Kemp's test): (+) bilaterally for facet joint pain.   Gait & Posture Assessment  Ambulation: Unassisted Gait: Relatively normal for age and body habitus Posture: WNL    Lower Extremity Exam      Side: Right lower extremity   Side: Left lower extremity  Stability: No instability observed           Stability: No instability observed          Skin & Extremity Inspection: Skin color, temperature, and hair growth are WNL. No peripheral edema or cyanosis. No masses, redness, swelling, asymmetry, or associated skin lesions. No contractures.   Skin & Extremity Inspection: Skin color, temperature, and hair growth are WNL. No peripheral edema or cyanosis. No masses, redness, swelling, asymmetry, or associated skin lesions. No contractures.  Functional ROM: Unrestricted ROM                   Functional ROM: Unrestricted ROM                  Muscle Tone/Strength: Functionally intact. No obvious neuro-muscular anomalies detected.   Muscle Tone/Strength: Functionally intact. No obvious neuro-muscular anomalies detected.  Sensory (Neurological): Neurogenic pain pattern         Sensory (Neurological): Neurogenic pain pattern        DTR: Patellar: deferred today Achilles: deferred today Plantar: deferred today   DTR: Patellar: deferred today Achilles: deferred today Plantar: deferred today  Palpation: No palpable anomalies   Palpation: No palpable anomalies    5 out of 5 strength bilateral lower extremity: Plantar flexion, dorsiflexion, knee flexion, knee extension.   Assessment   Diagnosis Status  1. Cervical radicular pain   2. Neuropathic pain   3. PAD (peripheral artery  disease) ([place])   4. Atherosclerosis of native artery of both lower extremities with intermittent claudication ([place])   5. Lumbar radicular pain   6. Pain management contract signed   7. Encounter for long-term opiate analgesic use   8. Chronic pain syndrome     Worsening Persistent Worsening     Plan of Care    Mr. Khoen Genet has a current medication list which includes the following long-term medication(s): propranolol and rosuvastatin.  Pharmacotherapy (Medications Ordered): Meds ordered this encounter  Medications   HYDROcodone-acetaminophen (NORCO/VICODIN) 5-325 MG tablet    Sig: Take 1 tablet by mouth every 6 (six) hours as needed for severe pain. Must last 30 days.    Dispense:  120 tablet    Refill:  0    Chronic Pain: STOP Act (Not applicable) Fill 1 day early if closed on refill date. Avoid benzodiazepines within 8 hours of opioids   HYDROcodone-acetaminophen (NORCO/VICODIN) 5-325 MG tablet    Sig: Take 1 tablet by mouth every 6 (six) hours as needed for severe pain. Must last 30 days.    Dispense:  120 tablet    Refill:  0    Chronic Pain: STOP Act (Not applicable) Fill 1 day early if closed on refill date. Avoid benzodiazepines within 8 hours of opioids   HYDROcodone-acetaminophen (NORCO/VICODIN) 5-325 MG tablet    Sig: Take 1 tablet by mouth every 6 (six) hours as needed for severe pain. Must last 30 days.    Dispense:  120 tablet    Refill:  0    Chronic Pain: STOP Act (Not applicable) Fill 1 day early if closed on refill date. Avoid benzodiazepines within 8 hours of opioids   Orders:  Orders Placed This Encounter  Procedures   Cervical Epidural Injection    Sedation: without Purpose: Diagnostic/Therapeutic Indication(s): Radiculitis and cervicalgia associater with cervical degenerative disc disease.    Standing Status:   Future    Standing Expiration Date:   06/05/2022    Scheduling Instructions:     Procedure: Cervical Epidural Steroid  Injection/Block     Level(s): C7-T1     Laterality: TBD     Timeframe: As soon as schedule allows    Order Specific Question:   Where will this procedure be performed?    Answer:   [facility]    Comments:   [place]unachimso Rigdon   Follow-up plan:   Return in about 6 days (around 03/12/2022) for Right C-ESI, in clinic [facility].    Recent Visits Date Type Provider Dept  01/04/22 Office Visit Gillis Santa, MD [facility]  12/12/21 Office Visit Gillis Santa, MD [facility]  Showing recent visits within past 90 days and meeting all other requirements Today's Visits Date Type Provider Dept  03/06/22 Office Visit Gillis Santa, MD [facility]  Showing today's visits and meeting all other requirements Future Appointments No visits were found meeting these conditions. Showing future appointments within next 90 days and meeting all other requirements  I discussed the assessment and treatment plan with the patient. The patient was provided an opportunity to ask questions and all were answered. The patient agreed with the plan and demonstrated an understanding of the instructions.  Patient advised to call back or seek an in-person evaluation if the symptoms or condition worsens.  Duration of encounter: 13mnutes.  Total time on encounter, as per AMA guidelines included both the face-to-face and non-face-to-face time personally spent by the physician and/or other qualified health care professional(s) on the day of the encounter (includes time in activities that require the physician or other qualified health care professional and does not include time in activities normally performed by clinical staff). Physician's time may include the following activities when performed: preparing to see the patient (eg, review of tests, pre-charting review of records) obtaining and/or reviewing separately obtained history performing a medically appropriate examination and/or  evaluation counseling and educating the patient/family/caregiver ordering medications, tests, or procedures referring and communicating with other health care professionals (when not separately reported) documenting clinical information in the electronic or other health record independently interpreting results (not separately reported) and communicating results to the patient/ family/caregiver care coordination (not separately reported)  Note by: BGillis Santa MD Date: 03/06/2022; Time: 11:51 AM

## 2022-03-06 NOTE — Progress Notes (Signed)
Nursing Pain Medication Assessment:  Safety precautions to be maintained throughout the outpatient stay will include: orient to surroundings, keep bed in low position, maintain call bell within reach at all times, provide assistance with transfer out of bed and ambulation.  Medication Inspection Compliance: [name] did not comply with our request to bring his pills to be counted. He was reminded that bringing the medication bottles, even when empty, is a requirement.  Medication: None brought in. Pill/Patch Count: None available to be counted. Bottle Appearance: No container available. Did not bring bottle(s) to appointment. Filled Date: N/A Last Medication intake:  Today

## 2022-03-06 NOTE — Patient Instructions (Signed)
____________________________________________________________________________________________  General Risks and Possible Complications  Patient Responsibilities: It is important that you read this as it is part of your informed consent. It is our duty to inform you of the risks and possible complications associated with treatments offered to you. It is your responsibility as a patient to read this and to ask questions about anything that is not clear or that you believe was not covered in this document.  Patient's Rights: You have the right to refuse treatment. You also have the right to change your mind, even after initially having agreed to have the treatment done. However, under this last option, if you wait until the last second to change your mind, you may be charged for the materials used up to that point.  Introduction: Medicine is not an exact science. Everything in Medicine, including the lack of treatment(s), carries the potential for danger, harm, or loss (which is by definition: Risk). In Medicine, a complication is a secondary problem, condition, or disease that can aggravate an already existing one. All treatments carry the risk of possible complications. The fact that a side effects or complications occurs, does not imply that the treatment was conducted incorrectly. It must be clearly understood that these can happen even when everything is done following the highest safety standards.  No treatment: You can choose not to proceed with the proposed treatment alternative. The "PRO(s)" would include: avoiding the risk of complications associated with the therapy. The "CON(s)" would include: not getting any of the treatment benefits. These benefits fall under one of three categories: diagnostic; therapeutic; and/or palliative. Diagnostic benefits include: getting information which can ultimately lead to improvement of the disease or symptom(s). Therapeutic benefits are those associated with  the successful treatment of the disease. Finally, palliative benefits are those related to the decrease of the primary symptoms, without necessarily curing the condition (example: decreasing the pain from a flare-up of a chronic condition, such as incurable terminal cancer).  General Risks and Complications: These are associated to most interventional treatments. They can occur alone, or in combination. They fall under one of the following six (6) categories: no benefit or worsening of symptoms; bleeding; infection; nerve damage; allergic reactions; and/or death. No benefits or worsening of symptoms: In Medicine there are no guarantees, only probabilities. No healthcare provider can ever guarantee that a medical treatment will work, they can only state the probability that it may. Furthermore, there is always the possibility that the condition may worsen, either directly, or indirectly, as a consequence of the treatment. Bleeding: This is more common if the patient is taking a blood thinner, either prescription or over the counter (example: Goody Powders, Fish oil, Aspirin, Garlic, etc.), or if suffering a condition associated with impaired coagulation (example: Hemophilia, cirrhosis of the liver, low platelet counts, etc.). However, even if you do not have one on these, it can still happen. If you have any of these conditions, or take one of these drugs, make sure to notify your treating physician. Infection: This is more common in patients with a compromised immune system, either due to disease (example: diabetes, cancer, human immunodeficiency virus [HIV], etc.), or due to medications or treatments (example: therapies used to treat cancer and rheumatological diseases). However, even if you do not have one on these, it can still happen. If you have any of these conditions, or take one of these drugs, make sure to notify your treating physician. Nerve Damage: This is more common when the treatment is an    invasive one, but it can also happen with the use of medications, such as those used in the treatment of cancer. The damage can occur to small secondary nerves, or to large primary ones, such as those in the spinal cord and brain. This damage may be temporary or permanent and it may lead to impairments that can range from temporary numbness to permanent paralysis and/or brain death. Allergic Reactions: Any time a substance or material comes in contact with our body, there is the possibility of an allergic reaction. These can range from a mild skin rash (contact dermatitis) to a severe systemic reaction (anaphylactic reaction), which can result in death. Death: In general, any medical intervention can result in death, most of the time due to an unforeseen complication. ____________________________________________________________________________________________    

## 2022-03-07 ENCOUNTER — Telehealth: Payer: Self-pay | Admitting: Student in an Organized Health Care Education/Training Program

## 2022-03-07 NOTE — Telephone Encounter (Signed)
Patient needs PA for Hydrocodone

## 2022-03-07 NOTE — Telephone Encounter (Signed)
done

## 2022-03-19 ENCOUNTER — Encounter: Payer: Self-pay | Admitting: Student in an Organized Health Care Education/Training Program

## 2022-03-19 ENCOUNTER — Ambulatory Visit
Admission: RE | Admit: 2022-03-19 | Discharge: 2022-03-19 | Disposition: A | Discharge status: Home / Self Care | Payer: Medicaid Other | Source: Ambulatory Visit | Attending: Student in an Organized Health Care Education/Training Program | Admitting: Student in an Organized Health Care Education/Training Program

## 2022-03-19 ENCOUNTER — Ambulatory Visit
Payer: Medicaid Other | Attending: Student in an Organized Health Care Education/Training Program | Admitting: Student in an Organized Health Care Education/Training Program

## 2022-03-19 VITALS — BP 101/78 | HR 85 | Temp 97.3°F | Resp 16 | Ht 65.0 in | Wt 170.0 lb

## 2022-03-19 DIAGNOSIS — G894 Chronic pain syndrome: Secondary | ICD-10-CM

## 2022-03-19 DIAGNOSIS — M792 Neuralgia and neuritis, unspecified: Secondary | ICD-10-CM | POA: Diagnosis present

## 2022-03-19 DIAGNOSIS — M5412 Radiculopathy, cervical region: Secondary | ICD-10-CM | POA: Diagnosis not present

## 2022-03-19 MED ORDER — DEXAMETHASONE SODIUM PHOSPHATE 10 MG/ML IJ SOLN
10.0000 mg | Freq: Once | INTRAMUSCULAR | Status: AC
  Administered 2022-03-19: 10 mg

## 2022-03-19 MED ORDER — LIDOCAINE HCL (PF) 2 % IJ SOLN
INTRAMUSCULAR | Status: AC
  Filled 2022-03-19: qty 10

## 2022-03-19 MED ORDER — LIDOCAINE HCL 2 % IJ SOLN
20.0000 mL | Freq: Once | INTRAMUSCULAR | Status: AC
  Administered 2022-03-19: 100 mg

## 2022-03-19 MED ORDER — ROPIVACAINE HCL 2 MG/ML IJ SOLN
1.0000 mL | Freq: Once | INTRAMUSCULAR | Status: AC
  Administered 2022-03-19: 1 mL via EPIDURAL

## 2022-03-19 MED ORDER — IOHEXOL 180 MG/ML  SOLN
10.0000 mL | Freq: Once | INTRAMUSCULAR | Status: AC
  Administered 2022-03-19: 10 mL via EPIDURAL
  Filled 2022-03-19: qty 20

## 2022-03-19 MED ORDER — SODIUM CHLORIDE (PF) 0.9 % IJ SOLN
INTRAMUSCULAR | Status: AC
  Filled 2022-03-19: qty 10

## 2022-03-19 MED ORDER — ROPIVACAINE HCL 2 MG/ML IJ SOLN
INTRAMUSCULAR | Status: AC
  Filled 2022-03-19: qty 20

## 2022-03-19 MED ORDER — DEXAMETHASONE SODIUM PHOSPHATE 10 MG/ML IJ SOLN
INTRAMUSCULAR | Status: AC
  Filled 2022-03-19: qty 1

## 2022-03-19 MED ORDER — SODIUM CHLORIDE 0.9% FLUSH
1.0000 mL | Freq: Once | INTRAVENOUS | Status: AC
  Administered 2022-03-19: 1 mL

## 2022-03-19 NOTE — Patient Instructions (Signed)
Pain Management Discharge Instructions  General Discharge Instructions :  If you need to reach your doctor call: Monday-Friday 8:00 am - 4:00 pm at [phone] or toll free [phone].  After clinic hours [phone] to have operator reach doctor.  Bring all of your medication bottles to all your appointments in the pain clinic.  To cancel or reschedule your appointment with Pain Management please remember to call 24 hours in advance to avoid a fee.  Refer to the educational materials which you have been given on: General Risks, I had my Procedure. Discharge Instructions, Post Sedation.  Post Procedure Instructions:  The drugs you were given will stay in your system until tomorrow, so for the next 24 hours you should not drive, make any legal decisions or drink any alcoholic beverages.  You may eat anything you prefer, but it is better to start with liquids then soups and crackers, and gradually work up to solid foods.  Please notify your doctor immediately if you have any unusual bleeding, trouble breathing or pain that is not related to your normal pain.  Depending on the type of procedure that was done, some parts of your body may feel week and/or numb.  This usually clears up by tonight or the next day.  Walk with the use of an assistive device or accompanied by an adult for the 24 hours.  You may use ice on the affected area for the first 24 hours.  Put ice in a Ziploc bag and cover with a towel and place against area 15 minutes on 15 minutes off.  You may switch to heat after 24 hours.Epidural Steroid Injection Patient Information  Description: The epidural space surrounds the nerves as they exit the spinal cord.  In some patients, the nerves can be compressed and inflamed by a bulging disc or a tight spinal canal (spinal stenosis).  By injecting steroids into the epidural space, we can bring irritated nerves into direct contact with a potentially helpful medication.  These  steroids act directly on the irritated nerves and can reduce swelling and inflammation which often leads to decreased pain.  Epidural steroids may be injected anywhere along the spine and from the neck to the low back depending upon the location of your pain.   After numbing the skin with local anesthetic (like Novocaine), a small needle is passed into the epidural space slowly.  You may experience a sensation of pressure while this is being done.  The entire block usually last less than 10 minutes.  Conditions which may be treated by epidural steroids:  Low back and leg pain Neck and arm pain Spinal stenosis Post-laminectomy syndrome Herpes zoster (shingles) pain Pain from compression fractures  Preparation for the injection:  Do not eat any solid food or dairy products within 8 hours of your appointment.  You may drink clear liquids up to 3 hours before appointment.  Clear liquids include water, black coffee, juice or soda.  No milk or cream please. You may take your regular medication, including pain medications, with a sip of water before your appointment  Diabetics should hold regular insulin (if taken separately) and take 1/2 normal NPH dos the morning of the procedure.  Carry some sugar containing items with you to your appointment. A driver must accompany you and be prepared to drive you home after your procedure.  Bring all your current medications with your. An IV may be inserted and sedation may be given at the discretion of the physician.  A blood pressure cuff, EKG and other monitors will often be applied during the procedure.  Some patients may need to have extra oxygen administered for a short period. You will be asked to provide medical information, including your allergies, prior to the procedure.  We must know immediately if you are taking blood thinners (like Coumadin/Warfarin)  Or if you are allergic to IV iodine contrast (dye). We must know if you could possible be  pregnant.  Possible side-effects: Bleeding from needle site Infection (rare, may require surgery) Nerve injury (rare) Numbness & tingling (temporary) Difficulty urinating (rare, temporary) Spinal headache ( a headache worse with upright posture) Light -headedness (temporary) Pain at injection site (several days) Decreased blood pressure (temporary) Weakness in arm/leg (temporary) Pressure sensation in back/neck (temporary)  Call if you experience: Fever/chills associated with headache or increased back/neck pain. Headache worsened by an upright position. New onset weakness or numbness of an extremity below the injection site Hives or difficulty breathing (go to the emergency room) Inflammation or drainage at the infection site Severe back/neck pain Any new symptoms which are concerning to you  Please note:  Although the local anesthetic injected can often make your back or neck feel good for several hours after the injection, the pain will likely return.  It takes 3-7 days for steroids to work in the epidural space.  You may not notice any pain relief for at least that one week.  If effective, we will often do a series of three injections spaced 3-6 weeks apart to maximally decrease your pain.  After the initial series, we generally will wait several months before considering a repeat injection of the same type.  If you have any questions, please call [phone] [facility]

## 2022-03-19 NOTE — Progress Notes (Signed)
PROVIDER NOTE: Interpretation of information contained herein should be left to medically-trained personnel. Specific patient instructions are provided elsewhere under "Patient Instructions" section of medical record. This document was created in part using STT-dictation technology, any transcriptional errors that may result from this process are unintentional.  Patient: [name] Type: Established DOB: 12-Mar-1965 MRN: [id number] PCP: Casilda Carls, MD  Service: Procedure DOS: 03/19/2022 Setting: Ambulatory Location: Ambulatory outpatient facility Delivery: Face-to-face Provider: Gillis Santa, MD Specialty: Interventional Pain Management Specialty designation: 09 Location: Outpatient facility Ref. Prov.: Casilda Carls, MD   Procedure [facility] Interventional Pain Management )   Type: Cervical Epidural Steroid injection (ESI) (Interlaminar) #1  Laterality: Right  Level: C7-T1 Imaging: Fluoroscopy-assisted DOS: 03/19/2022  Performed by: Gillis Santa, MD Anesthesia: Local anesthesia (1-2% Lidocaine)  Purpose: Diagnostic/Therapeutic Indications: Cervicalgia, cervical radicular pain, degenerative disc disease, severe enough to impact quality of life or function. 1. Cervical radicular pain   2. Neuropathic pain   3. Chronic pain syndrome    NAS-11 score:   Pre-procedure: 8 /10   Post-procedure: 6 /10      Pre-Procedure Preparation  Monitoring: As per clinic protocol. Respiration, ETCO2, SpO2, BP, heart rate and rhythm monitor placed and checked for adequate function  Risk Assessment: Vitals:  [id number] body mass index is 28.29 kg/m as calculated from the following:   Height as of this encounter: 5\' 5"  (1.651 m).   Weight as of this encounter: 170 lb (77.1 kg)., Rate:85ECG Heart Rate: 70, BP:111/82, Resp:16, Temp:(!) 97.3 F (36.3 C), SpO2:100 %  Allergies: He has No Known Allergies.  Precautions: None required  Blood-thinner(s): None at this time  Coagulopathies:  Reviewed. None identified.   Active Infection(s): Reviewed. None identified. Mr. Michaux is afebrile   Location setting: Procedure suite Position: Prone, on modified reverse trendelenburg to facilitate breathing, with head in head-cradle. Pillows positioned under chest (below chin-level) with cervical spine flexed. Safety Precautions: Patient was assessed for positional comfort and pressure points before starting the procedure. Prepping solution: DuraPrep (Iodine Povacrylex [0.7% available iodine] and Isopropyl Alcohol, 74% w/w) Prep Area: Entire  cervicothoracic region Approach: percutaneous, paramedial Intended target: Posterior cervical epidural space Materials: Tray: Epidural Needle(s): Epidural (Tuohy) Qty: 1 Length: (42mm) 3.5-inch Gauge: 22G   Meds ordered this encounter  Medications   iohexol (OMNIPAQUE) 180 MG/ML injection 10 mL    Must be Myelogram-compatible. If not available, you may substitute with a water-soluble, non-ionic, hypoallergenic, myelogram-compatible radiological contrast medium.   lidocaine (XYLOCAINE) 2 % (with pres) injection 400 mg   ropivacaine (PF) 2 mg/mL (0.2%) (NAROPIN) injection 1 mL   sodium chloride flush (NS) 0.9 % injection 1 mL   dexamethasone (DECADRON) injection 10 mg    Orders Placed This Encounter  Procedures   DG PAIN CLINIC C-ARM 1-60 MIN NO REPORT    Intraoperative interpretation by procedural physician at [place].    Standing Status:   Standing    Number of Occurrences:   1    Order Specific Question:   Reason for exam:    Answer:   Assistance in needle guidance and placement for procedures requiring needle placement in or near specific anatomical locations not easily accessible without such assistance.     Time-out: 1024 I initiated and conducted the "Time-out" before starting the procedure, as per protocol. The patient was asked to participate by confirming the accuracy of the "Time Out" information. Verification  of the correct person, site, and procedure were performed and confirmed by me, the nursing staff, and the  patient. "Time-out" conducted as per Joint Commission's Universal Protocol (UP.01.01.01). Procedure checklist: Completed   H&P (Pre-op  Assessment)  Mr. Pickrel is a 57 y.o. (year old), male patient, seen today for interventional treatment. 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 Mr. Luffman has a current medication list which includes the following prescription(s): acetaminophen, aspirin ec, hydrocodone-acetaminophen, [START ON 04/06/2022] hydrocodone-acetaminophen, [START ON 05/06/2022] hydrocodone-acetaminophen, ibuprofen, propranolol, and rosuvastatin. His primarily concern today is the Neck Pain (right)  He has No Known Allergies.   Last encounter: My last encounter with him was on 03/07/2022. Pertinent problems: Mr. Chelf has Chronic distal aortic occlusion ([place]); Neuropathic pain; Atherosclerosis of native artery of both lower extremities with intermittent claudication ([place]); Pain management contract signed; Encounter for long-term opiate analgesic use; and Chronic pain syndrome on their pertinent problem list. Pain Assessment: Severity of Chronic pain is reported as a 8 /10. Location: Neck Anterior/right shoulder down arm above elbow, around left ear. Onset: More than a month ago. Quality: Aching, Constant, Pressure, Contraction. Timing: Constant. Modifying factor(s): heat, medications. Vitals:  height is 5\' 5"  (1.651 m) and weight is 170 lb (77.1 kg). His temperature is 97.3 F (36.3 C) (abnormal). His blood  pressure is 101/78 and his pulse is 85. His respiration is 16 and oxygen saturation is 97%.   Reason for encounter: Interventional pain management therapy due pain of at least four (4) weeks in duration, with to failure to respond to and/or inability to tolerate more conservative care.   Site Confirmation: Mr. Alen was asked to confirm the procedure and laterality before marking the site.  Consent: Before the procedure and under the influence of no sedative(s), amnesic(s), or anxiolytics, the patient was informed of the treatment options, risks and possible complications. To fulfill our ethical and legal obligations, as recommended by the American Medical Association's Code of Ethics, I have informed the patient of my clinical impression; the nature and purpose of the treatment or procedure; the risks, benefits, and possible complications of the intervention; the alternatives, including doing nothing; the risk(s) and benefit(s) of the alternative treatment(s) or procedure(s); and the risk(s) and benefit(s) of doing nothing. The patient was provided information about the general risks and possible complications associated with the procedure. These may include, but are not limited to: failure to achieve desired goals, infection, bleeding, organ or nerve damage, allergic reactions, paralysis, and death. In addition, the patient was informed of those risks and complications associated to Spine-related procedures, such as failure to decrease pain; infection (i.e.: Meningitis, epidural or intraspinal abscess); bleeding (i.e.: epidural hematoma, subarachnoid hemorrhage, or any other type of intraspinal or peri-dural bleeding); organ or nerve damage (i.e.: Any type of peripheral nerve, nerve root, or spinal cord injury) with subsequent damage to sensory, motor, and/or autonomic systems, resulting in permanent pain, numbness, and/or weakness of one or several areas of the body; allergic reactions; (i.e.:  anaphylactic reaction); and/or death. Furthermore, the patient was informed of those risks and complications associated with the medications. These include, but are not limited to: allergic reactions (i.e.: anaphylactic or anaphylactoid reaction(s)); adrenal axis suppression; blood sugar elevation that in diabetics may result in ketoacidosis or comma; water retention that in patients with history of congestive heart failure  may result in shortness of breath, pulmonary edema, and decompensation with resultant heart failure; weight gain; swelling or edema; medication-induced neural toxicity; particulate matter embolism and blood vessel occlusion with resultant organ, and/or nervous system infarction; and/or aseptic necrosis of one or more joints. Finally, the patient was informed that Medicine is not an exact science; therefore, there is also the possibility of unforeseen or unpredictable risks and/or possible complications that may result in a catastrophic outcome. The patient indicated having understood very clearly. We have given the patient no guarantees and we have made no promises. Enough time was given to the patient to ask questions, all of which were answered to the patient's satisfaction. Mr. Barfield has indicated that he wanted to continue with the procedure. Attestation: I, the ordering provider, attest that I have discussed with the patient the benefits, risks, side-effects, alternatives, likelihood of achieving goals, and potential problems during recovery for the procedure that I have provided informed consent.  Date  Time: 03/19/2022 10:03 AM   Prophylactic antibiotics  Anti-infectives (From admission, onward)    None      Indication(s): None identified   Description of procedure   Start Time: 1025 hrs  Local Anesthesia: Once the patient was positioned, prepped, and time-out was completed. The target area was identified located. The skin was marked with an approved surgical skin  marker. Once marked, the skin (epidermis, dermis, and hypodermis), and deeper tissues (fat, connective tissue and muscle) were infiltrated with a small amount of a short-acting local anesthetic, loaded on a 10cc syringe with a 25G, 1.5-in  Needle. An appropriate amount of time was allowed for local anesthetics to take effect before proceeding to the next step. Local Anesthetic: Lidocaine 1-2% The unused portion of the local anesthetic was discarded in the proper designated containers. Safety Precautions: Aspiration looking for blood return was conducted prior to all injections. At no point did I inject any substances, as a needle was being advanced. Before injecting, the patient was told to immediately notify me if he was experiencing any new onset of "ringing in the ears, or metallic taste in the mouth". No attempts were made at seeking any paresthesias. Safe injection practices and needle disposal techniques used. Medications properly checked for expiration dates. SDV (single dose vial) medications used. After the completion of the procedure, all disposable equipment used was discarded in the proper designated medical waste containers.  Technical description: Protocol guidelines were followed. Using fluoroscopic guidance, the epidural needle was introduced through the skin, ipsilateral to the reported pain, and advanced to the target area. Posterior laminar os was contacted and the needle walked caudad, until the lamina was cleared. The ligamentum flavum was engaged and the epidural space identified using "loss-of-resistance technique" with 2-3 ml of PF-NaCl (0.9% NSS), in a 5cc dedicated LOR syringe. See "Imaging guidance" below for use of contrast details.  Injection: Once satisfactory needle placement was confirmed, I proceeded to inject the desired solution in slow, incremental fashion, intermittently assessing for discomfort or any signs of abnormal or undesired spread of substance. Once completed, the  needle was removed and disposed of, as per hospital protocols.  3 cc solution made of 1 cc of preservative-free saline, 1 cc of 0.2% ropivacaine, 1 cc of Decadron 10 mg/cc.    Vitals:   03/19/22 1006 03/19/22 1022 03/19/22 1027 03/19/22 1029  BP: 111/82 102/74 110/74 101/78  Pulse: 85     Resp: 16 18 16 16   Temp: (!) 97.3 F (36.3 C)  SpO2: 100% 97% 98% 97%  Weight: 170 lb (77.1 kg)     Height: 5\' 5"  (1.651 m)       End Time: 1029 hrs  Once the entire procedure was completed, the treated area was cleaned, making sure to leave some of the prepping solution back to take advantage of its long term bactericidal properties.   Imaging guidance  Type of Imaging Technique: Fluoroscopy Guidance (Spinal) Indication(s): Assistance in needle guidance and placement for procedures requiring needle placement in or near specific anatomical locations not easily accessible without such assistance. Exposure Time: Please see nurses notes for exact fluoroscopy time. Contrast: Before injecting any contrast, we confirmed that the patient did not have an allergy to iodine, shellfish, or radiological contrast. Once satisfactory needle placement was completed, radiological contrast was injected under continuous fluoroscopic guidance. Injection of contrast accomplished without complications. See chart for type and volume of contrast used. Fluoroscopic Guidance: I was personally present in the fluoroscopy suite, where the patient was placed in position for the procedure, over the fluoroscopy-compatible table. Fluoroscopy was manipulated, using "Tunnel Vision Technique", to obtain the best possible view of the target area, on the affected side. Parallax error was corrected before commencing the procedure. A "direction-depth-direction" technique was used to introduce the needle under continuous pulsed fluoroscopic guidance. Once the target was reached, antero-posterior, oblique, and lateral fluoroscopic projection  views were taken to confirm needle placement in all planes. Electronic images uploaded into EMR.  Interpretation: Successful epidural injection. Intraoperative imaging interpretation by performing Physician.    Post-op assessment  Post-procedure Vital Signs:  Pulse/HCG Rate: 8576 Temp: (!) 97.3 F (36.3 C) Resp: 16 BP: 101/78 SpO2: 97 %  EBL: None  Complications: No immediate post-treatment complications observed by team, or reported by patient.  Note: The patient tolerated the entire procedure well. A repeat set of vitals were taken after the procedure and the patient was kept under observation following institutional policy, for this type of procedure. Post-procedural neurological assessment was performed, showing return to baseline, prior to discharge. The patient was provided with post-procedure discharge instructions, including a section on how to identify potential problems. Should any problems arise concerning this procedure, the patient was given instructions to immediately contact , at any time, without hesitation. In any case, we plan to contact the patient by telephone for a follow-up status report regarding this interventional procedure.  Comments:  No additional relevant information.  5 out of 5 strength bilateral upper extremity: Shoulder abduction, elbow flexion, elbow extension, thumb extension.   Plan of care    Medications administered: We administered iohexol, lidocaine, ropivacaine (PF) 2 mg/mL (0.2%), sodium chloride flush, and dexamethasone.  Follow-up plan:   Return for Keep sch. appt.     Recent Visits Date Type Provider Dept  03/06/22 Office Visit 05/05/22, MD [facility]  01/04/22 Office Visit 13/02/23, MD [facility]  Showing recent visits within past 90 days and meeting all other requirements Today's Visits Date Type Provider Dept  03/19/22 Procedure visit 03/21/22, MD [facility]  Showing today's  visits and meeting all other requirements Future Appointments Date Type Provider Dept  06/05/22 Appointment 08/05/22, MD [facility]  Showing future appointments within next 90 days and meeting all other requirements   Disposition: Discharge home  Discharge (Date  Time): 03/19/2022; 1038 hrs.   Primary Care Physician: 03/21/2022, MD Location: [facility] Note by: [facility], MD Date: 03/19/2022; Time: 10:50 AM  DISCLAIMER: Medicine is not an Chief Strategy Officer. It has no guarantees or warranties. The decision to proceed with this intervention was based on the information collected from the patient. Conclusions were drawn from the patient's questionnaire, interview, and examination. Because information was provided in large part by the patient, it cannot be guaranteed that it has not been purposely or unconsciously manipulated or altered. Every effort has been made to obtain as much accurate, relevant, available data as possible. Always take into account that the treatment will also be dependent on availability of resources and existing treatment guidelines, considered by other Pain Management Specialists as being common knowledge and practice, at the time of the intervention. It is also important to point out that variation in procedural techniques and pharmacological choices are the acceptable norm. For Medico-Legal review purposes, the indications, contraindications, technique, and results of the these procedures should only be evaluated, judged and interpreted by a Board-Certified Interventional Pain Specialist with extensive familiarity and expertise in the same exact procedure and technique.

## 2022-03-19 NOTE — Progress Notes (Signed)
Safety precautions to be maintained throughout the outpatient stay will include: orient to surroundings, keep bed in low position, maintain call bell within reach at all times, provide assistance with transfer out of bed and ambulation.  

## 2022-03-20 ENCOUNTER — Telehealth: Payer: Self-pay

## 2022-03-20 NOTE — Telephone Encounter (Signed)
Post procedure follow up.  LM 

## 2022-03-27 ENCOUNTER — Encounter: Payer: Medicaid Other | Admitting: Physical Therapy

## 2022-04-06 ENCOUNTER — Other Ambulatory Visit: Payer: Self-pay | Admitting: Family Medicine

## 2022-04-06 DIAGNOSIS — M79642 Pain in left hand: Secondary | ICD-10-CM

## 2022-04-06 DIAGNOSIS — M25562 Pain in left knee: Secondary | ICD-10-CM

## 2022-04-17 ENCOUNTER — Ambulatory Visit
Admission: RE | Admit: 2022-04-17 | Discharge: 2022-04-17 | Disposition: A | Discharge status: Home / Self Care | Payer: Disability Insurance | Source: Ambulatory Visit | Attending: Family Medicine | Admitting: Family Medicine

## 2022-04-17 ENCOUNTER — Ambulatory Visit
Admission: RE | Admit: 2022-04-17 | Discharge: 2022-04-17 | Disposition: A | Discharge status: Home / Self Care | Payer: Disability Insurance | Attending: Family Medicine | Admitting: Family Medicine

## 2022-04-17 DIAGNOSIS — M25562 Pain in left knee: Secondary | ICD-10-CM

## 2022-04-17 DIAGNOSIS — M79642 Pain in left hand: Secondary | ICD-10-CM | POA: Diagnosis not present

## 2022-04-30 ENCOUNTER — Other Ambulatory Visit (INDEPENDENT_AMBULATORY_CARE_PROVIDER_SITE_OTHER): Payer: Self-pay | Admitting: Nurse Practitioner

## 2022-04-30 DIAGNOSIS — Z9889 Other specified postprocedural states: Secondary | ICD-10-CM

## 2022-05-04 ENCOUNTER — Ambulatory Visit (INDEPENDENT_AMBULATORY_CARE_PROVIDER_SITE_OTHER): Payer: Medicaid Other

## 2022-05-04 ENCOUNTER — Other Ambulatory Visit (INDEPENDENT_AMBULATORY_CARE_PROVIDER_SITE_OTHER): Payer: Self-pay | Admitting: Nurse Practitioner

## 2022-05-04 DIAGNOSIS — R6889 Other general symptoms and signs: Secondary | ICD-10-CM

## 2022-05-04 DIAGNOSIS — Z9889 Other specified postprocedural states: Secondary | ICD-10-CM

## 2022-05-04 DIAGNOSIS — I739 Peripheral vascular disease, unspecified: Secondary | ICD-10-CM

## 2022-05-07 ENCOUNTER — Encounter (INDEPENDENT_AMBULATORY_CARE_PROVIDER_SITE_OTHER): Payer: Self-pay | Admitting: Vascular Surgery

## 2022-05-07 ENCOUNTER — Ambulatory Visit (INDEPENDENT_AMBULATORY_CARE_PROVIDER_SITE_OTHER): Payer: Medicaid Other | Admitting: Vascular Surgery

## 2022-05-07 VITALS — BP 111/72 | HR 84 | Resp 16 | Wt 164.4 lb

## 2022-05-07 DIAGNOSIS — I70223 Atherosclerosis of native arteries of extremities with rest pain, bilateral legs: Secondary | ICD-10-CM | POA: Diagnosis not present

## 2022-05-07 DIAGNOSIS — M47817 Spondylosis without myelopathy or radiculopathy, lumbosacral region: Secondary | ICD-10-CM

## 2022-05-07 DIAGNOSIS — I70229 Atherosclerosis of native arteries of extremities with rest pain, unspecified extremity: Secondary | ICD-10-CM | POA: Diagnosis not present

## 2022-05-07 DIAGNOSIS — E782 Mixed hyperlipidemia: Secondary | ICD-10-CM

## 2022-05-07 LAB — VAS US ABI WITH/WO TBI
Left ABI: 0.49
Right ABI: 0.53
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
## 2022-05-08 ENCOUNTER — Telehealth (INDEPENDENT_AMBULATORY_CARE_PROVIDER_SITE_OTHER): Payer: Self-pay

## 2022-05-08 NOTE — Telephone Encounter (Signed)
Patient did call back and we discussed the pre-procedure instructions. Patient stated he understood and I did advise to call back with an interpreter if he was unclear about any instructions.

## 2022-05-08 NOTE — Telephone Encounter (Signed)
I attempted to contact the patient using an interpreter service and a message was left stating that the patient's procedure information will be sent to his Mychart and mailed.
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
## 2022-05-14 ENCOUNTER — Encounter (INDEPENDENT_AMBULATORY_CARE_PROVIDER_SITE_OTHER): Payer: Self-pay | Admitting: Vascular Surgery

## 2022-05-14 ENCOUNTER — Ambulatory Visit (INDEPENDENT_AMBULATORY_CARE_PROVIDER_SITE_OTHER): Payer: Medicaid Other | Admitting: Vascular Surgery

## 2022-05-14 VITALS — BP 126/78 | HR 75 | Resp 16 | Wt 167.6 lb

## 2022-05-14 DIAGNOSIS — I70229 Atherosclerosis of native arteries of extremities with rest pain, unspecified extremity: Secondary | ICD-10-CM | POA: Diagnosis not present

## 2022-05-14 DIAGNOSIS — M47817 Spondylosis without myelopathy or radiculopathy, lumbosacral region: Secondary | ICD-10-CM | POA: Diagnosis not present

## 2022-05-14 DIAGNOSIS — E782 Mixed hyperlipidemia: Secondary | ICD-10-CM

## 2022-05-15 ENCOUNTER — Encounter
Admission: AD | Disposition: A | Discharge status: Home / Self Care | Payer: Self-pay | Source: Ambulatory Visit | Attending: Vascular Surgery

## 2022-05-15 ENCOUNTER — Inpatient Hospital Stay
Admission: AD | Admit: 2022-05-15 | Discharge: 2022-05-21 | DRG: 271 | Disposition: A | Discharge status: Home / Self Care | Payer: Medicaid Other | Source: Ambulatory Visit | Attending: Vascular Surgery | Admitting: Vascular Surgery

## 2022-05-15 DIAGNOSIS — M7981 Nontraumatic hematoma of soft tissue: Secondary | ICD-10-CM | POA: Diagnosis not present

## 2022-05-15 DIAGNOSIS — I70229 Atherosclerosis of native arteries of extremities with rest pain, unspecified extremity: Secondary | ICD-10-CM | POA: Diagnosis present

## 2022-05-15 DIAGNOSIS — Z7901 Long term (current) use of anticoagulants: Secondary | ICD-10-CM

## 2022-05-15 DIAGNOSIS — Z7982 Long term (current) use of aspirin: Secondary | ICD-10-CM

## 2022-05-15 DIAGNOSIS — I7090 Unspecified atherosclerosis: Secondary | ICD-10-CM | POA: Diagnosis present

## 2022-05-15 DIAGNOSIS — D6832 Hemorrhagic disorder due to extrinsic circulating anticoagulants: Secondary | ICD-10-CM | POA: Diagnosis not present

## 2022-05-15 DIAGNOSIS — D62 Acute posthemorrhagic anemia: Secondary | ICD-10-CM | POA: Diagnosis not present

## 2022-05-15 DIAGNOSIS — Z9889 Other specified postprocedural states: Secondary | ICD-10-CM | POA: Diagnosis not present

## 2022-05-15 DIAGNOSIS — E782 Mixed hyperlipidemia: Secondary | ICD-10-CM | POA: Diagnosis present

## 2022-05-15 DIAGNOSIS — F17210 Nicotine dependence, cigarettes, uncomplicated: Secondary | ICD-10-CM | POA: Diagnosis present

## 2022-05-15 DIAGNOSIS — Y832 Surgical operation with anastomosis, bypass or graft as the cause of abnormal reaction of the patient, or of later complication, without mention of misadventure at the time of the procedure: Secondary | ICD-10-CM | POA: Diagnosis present

## 2022-05-15 DIAGNOSIS — G894 Chronic pain syndrome: Secondary | ICD-10-CM | POA: Diagnosis present

## 2022-05-15 DIAGNOSIS — Z8249 Family history of ischemic heart disease and other diseases of the circulatory system: Secondary | ICD-10-CM

## 2022-05-15 DIAGNOSIS — I70223 Atherosclerosis of native arteries of extremities with rest pain, bilateral legs: Secondary | ICD-10-CM | POA: Diagnosis present

## 2022-05-15 DIAGNOSIS — R58 Hemorrhage, not elsewhere classified: Secondary | ICD-10-CM | POA: Diagnosis not present

## 2022-05-15 DIAGNOSIS — Z833 Family history of diabetes mellitus: Secondary | ICD-10-CM

## 2022-05-15 DIAGNOSIS — T82868A Thrombosis of vascular prosthetic devices, implants and grafts, initial encounter: Secondary | ICD-10-CM | POA: Diagnosis present

## 2022-05-15 DIAGNOSIS — I7419 Embolism and thrombosis of other parts of aorta: Secondary | ICD-10-CM

## 2022-05-15 DIAGNOSIS — Z79899 Other long term (current) drug therapy: Secondary | ICD-10-CM

## 2022-05-15 DIAGNOSIS — I97638 Postprocedural hematoma of a circulatory system organ or structure following other circulatory system procedure: Secondary | ICD-10-CM | POA: Diagnosis not present

## 2022-05-15 DIAGNOSIS — Z820 Family history of epilepsy and other diseases of the nervous system: Secondary | ICD-10-CM

## 2022-05-15 DIAGNOSIS — T45525A Adverse effect of antithrombotic drugs, initial encounter: Secondary | ICD-10-CM | POA: Diagnosis not present

## 2022-05-15 HISTORY — PX: LOWER EXTREMITY INTERVENTION: CATH118252

## 2022-05-15 LAB — BASIC METABOLIC PANEL
Anion gap: 6 (ref 5–15)
BUN: 17 mg/dL (ref 6–20)
CO2: 24 mmol/L (ref 22–32)
Calcium: 9 mg/dL (ref 8.9–10.3)
Chloride: 106 mmol/L (ref 98–111)
Creatinine, Ser: 0.58 mg/dL — ABNORMAL LOW (ref 0.61–1.24)
GFR, Estimated: >60 mL/min (ref >60)
Glucose, Bld: 128 mg/dL — ABNORMAL HIGH (ref 70–99)
Potassium: 3.8 mmol/L (ref 3.5–5.1)
Sodium: 136 mmol/L (ref 135–145)

## 2022-05-15 LAB — CBC
HCT: 42.2 % (ref 39.0–52.0)
Hemoglobin: 14.1 g/dL (ref 13.0–17.0)
MCH: 28.5 pg (ref 26.0–34.0)
MCHC: 33.4 g/dL (ref 30.0–36.0)
MCV: 85.4 fL (ref 80.0–100.0)
Platelets: 203 10*3/uL (ref 150–400)
RBC: 4.94 MIL/uL (ref 4.22–5.81)
RDW: 13.1 % (ref 11.5–15.5)
WBC: 7.2 10*3/uL (ref 4.0–10.5)
nRBC: 0 % (ref 0.0–0.2)

## 2022-05-15 LAB — BUN: BUN: 16 mg/dL (ref 6–20)

## 2022-05-15 LAB — CREATININE, SERUM
Creatinine, Ser: 0.68 mg/dL (ref 0.61–1.24)
GFR, Estimated: >60 mL/min (ref >60)

## 2022-05-15 LAB — PHOSPHORUS: Phosphorus: 3 mg/dL (ref 2.5–4.6)

## 2022-05-15 LAB — HEPARIN LEVEL (UNFRACTIONATED): Heparin Unfractionated: 0.1 IU/mL — ABNORMAL LOW (ref 0.30–0.70)

## 2022-05-15 LAB — GLUCOSE, CAPILLARY: Glucose-Capillary: 114 mg/dL — ABNORMAL HIGH (ref 70–99)

## 2022-05-15 LAB — MAGNESIUM: Magnesium: 2.1 mg/dL (ref 1.7–2.4)

## 2022-05-15 LAB — MRSA NEXT GEN BY PCR, NASAL: MRSA by PCR Next Gen: NOT DETECTED

## 2022-05-15 LAB — FIBRINOGEN: Fibrinogen: 263 mg/dL (ref 210–475)

## 2022-05-15 SURGERY — LOWER EXTREMITY INTERVENTION
Anesthesia: Moderate Sedation | Laterality: Bilateral

## 2022-05-15 MED ORDER — ORAL CARE MOUTH RINSE: 15.0000 mL | OROMUCOSAL | Status: DC | PRN

## 2022-05-15 MED ORDER — HEPARIN SODIUM (PORCINE) 1000 UNIT/ML IJ SOLN
INTRAMUSCULAR | Status: DC | PRN
  Administered 2022-05-15: 6000 [IU] via INTRAVENOUS

## 2022-05-15 MED ORDER — HYDROMORPHONE HCL 1 MG/ML IJ SOLN
INTRAMUSCULAR | Status: AC
  Administered 2022-05-16: 1 mg via INTRAVENOUS
  Filled 2022-05-15: qty 1

## 2022-05-15 MED ORDER — HEPARIN (PORCINE) 25000 UT/250ML-% IV SOLN
600.0000 [IU]/h | INTRAVENOUS | Status: DC
  Administered 2022-05-15: 600 [IU]/h via INTRAVENOUS

## 2022-05-15 MED ORDER — ONDANSETRON HCL 4 MG/2ML IJ SOLN: 4.0000 mg | Freq: Four times a day (QID) | INTRAMUSCULAR | Status: DC | PRN

## 2022-05-15 MED ORDER — HEPARIN (PORCINE) 25000 UT/250ML-% IV SOLN: 800.0000 [IU]/h | INTRAVENOUS | Status: DC

## 2022-05-15 MED ORDER — ACETAMINOPHEN 325 MG PO TABS: 650.0000 mg | ORAL_TABLET | Freq: Every day | ORAL | Status: DC | PRN

## 2022-05-15 MED ORDER — CHLORHEXIDINE GLUCONATE CLOTH 2 % EX PADS: 6.0000 | MEDICATED_PAD | Freq: Once | CUTANEOUS | Status: AC

## 2022-05-15 MED ORDER — METHYLPREDNISOLONE SODIUM SUCC 125 MG IJ SOLR: 125.0000 mg | Freq: Once | INTRAMUSCULAR | Status: AC | PRN

## 2022-05-15 MED ORDER — HYDROMORPHONE HCL 1 MG/ML IJ SOLN: 1.0000 mg | Freq: Once | INTRAMUSCULAR | Status: DC | PRN

## 2022-05-15 MED ORDER — METHYLPREDNISOLONE SODIUM SUCC 125 MG IJ SOLR
INTRAMUSCULAR | Status: DC | PRN
  Administered 2022-05-15: 125 mg via INTRAVENOUS

## 2022-05-15 MED ORDER — CEFAZOLIN SODIUM-DEXTROSE 2-4 GM/100ML-% IV SOLN
2.0000 g | INTRAVENOUS | Status: AC
  Filled 2022-05-15: qty 100

## 2022-05-15 MED ORDER — CEFAZOLIN SODIUM-DEXTROSE 2-4 GM/100ML-% IV SOLN
INTRAVENOUS | Status: AC
  Filled 2022-05-15: qty 100

## 2022-05-15 MED ORDER — SODIUM CHLORIDE 0.9% FLUSH: 3.0000 mL | INTRAVENOUS | Status: DC | PRN

## 2022-05-15 MED ORDER — CEFAZOLIN SODIUM-DEXTROSE 2-4 GM/100ML-% IV SOLN
2.0000 g | INTRAVENOUS | Status: AC
  Administered 2022-05-15: 2 g via INTRAVENOUS

## 2022-05-15 MED ORDER — FAMOTIDINE 20 MG PO TABS: 40.0000 mg | ORAL_TABLET | Freq: Once | ORAL | Status: DC | PRN

## 2022-05-15 MED ORDER — HYDROCODONE-ACETAMINOPHEN 5-325 MG PO TABS: 1.0000 | ORAL_TABLET | Freq: Four times a day (QID) | ORAL | Status: DC | PRN

## 2022-05-15 MED ORDER — MIDAZOLAM HCL 2 MG/2ML IJ SOLN
INTRAMUSCULAR | Status: DC | PRN
  Administered 2022-05-15: 2 mg via INTRAVENOUS

## 2022-05-15 MED ORDER — SODIUM CHLORIDE 0.9 % IV SOLN: INTRAVENOUS | Status: DC

## 2022-05-15 MED ORDER — METHYLPREDNISOLONE SODIUM SUCC 125 MG IJ SOLR: 125.0000 mg | Freq: Once | INTRAMUSCULAR | Status: DC | PRN

## 2022-05-15 MED ORDER — MIDAZOLAM HCL 2 MG/2ML IJ SOLN
INTRAMUSCULAR | Status: AC
  Filled 2022-05-15: qty 2

## 2022-05-15 MED ORDER — HEPARIN (PORCINE) 25000 UT/250ML-% IV SOLN: 600.0000 [IU]/h | INTRAVENOUS | Status: DC

## 2022-05-15 MED ORDER — HEPARIN SODIUM (PORCINE) 1000 UNIT/ML IJ SOLN
INTRAMUSCULAR | Status: AC
  Filled 2022-05-15: qty 10

## 2022-05-15 MED ORDER — CHLORHEXIDINE GLUCONATE CLOTH 2 % EX PADS
6.0000 | MEDICATED_PAD | Freq: Once | CUTANEOUS | Status: AC
  Administered 2022-05-15: 6 via TOPICAL

## 2022-05-15 MED ORDER — MIDAZOLAM HCL 2 MG/ML PO SYRP: 8.0000 mg | ORAL_SOLUTION | Freq: Once | ORAL | Status: DC | PRN

## 2022-05-15 MED ORDER — SODIUM CHLORIDE 0.9 % IV SOLN
0.5000 mg/h | INTRAVENOUS | Status: DC
  Administered 2022-05-15: 0.5 mg/h
  Filled 2022-05-15: qty 10

## 2022-05-15 MED ORDER — FENTANYL CITRATE (PF) 100 MCG/2ML IJ SOLN
INTRAMUSCULAR | Status: DC | PRN
  Administered 2022-05-15: 25 ug via INTRAVENOUS
  Administered 2022-05-15: 50 ug via INTRAVENOUS
  Administered 2022-05-15: 25 ug via INTRAVENOUS

## 2022-05-15 MED ORDER — FAMOTIDINE 20 MG PO TABS: 40.0000 mg | ORAL_TABLET | Freq: Once | ORAL | Status: AC | PRN

## 2022-05-15 MED ORDER — HYDRALAZINE HCL 20 MG/ML IJ SOLN: 5.0000 mg | INTRAMUSCULAR | Status: DC | PRN

## 2022-05-15 MED ORDER — DIPHENHYDRAMINE HCL 50 MG/ML IJ SOLN
INTRAMUSCULAR | Status: AC
  Filled 2022-05-15: qty 1

## 2022-05-15 MED ORDER — DIPHENHYDRAMINE HCL 50 MG/ML IJ SOLN: 50.0000 mg | Freq: Once | INTRAMUSCULAR | Status: DC | PRN

## 2022-05-15 MED ORDER — HYDROMORPHONE HCL 1 MG/ML IJ SOLN
1.0000 mg | Freq: Once | INTRAMUSCULAR | Status: AC | PRN
  Administered 2022-05-15: 1 mg via INTRAVENOUS
  Filled 2022-05-15: qty 1

## 2022-05-15 MED ORDER — FENTANYL CITRATE PF 50 MCG/ML IJ SOSY
PREFILLED_SYRINGE | INTRAMUSCULAR | Status: AC
  Filled 2022-05-15: qty 1

## 2022-05-15 MED ORDER — ASPIRIN 81 MG PO TBEC: 81.0000 mg | DELAYED_RELEASE_TABLET | Freq: Every day | ORAL | Status: DC

## 2022-05-15 MED ORDER — SODIUM CHLORIDE 0.9 % IV SOLN
1.0000 mg/h | INTRAVENOUS | Status: AC
  Administered 2022-05-15: 1 mg/h
  Filled 2022-05-15: qty 10

## 2022-05-15 MED ORDER — HEPARIN (PORCINE) IN NACL 1000-0.9 UT/500ML-% IV SOLN
INTRAVENOUS | Status: AC
  Filled 2022-05-15: qty 500

## 2022-05-15 MED ORDER — PROPRANOLOL HCL 20 MG PO TABS: 10.0000 mg | ORAL_TABLET | Freq: Three times a day (TID) | ORAL | Status: DC

## 2022-05-15 MED ORDER — DIPHENHYDRAMINE HCL 50 MG/ML IJ SOLN
INTRAMUSCULAR | Status: DC | PRN
  Administered 2022-05-15: 25 mg via INTRAVENOUS

## 2022-05-15 MED ORDER — SODIUM CHLORIDE 0.9% FLUSH
3.0000 mL | Freq: Two times a day (BID) | INTRAVENOUS | Status: DC
  Administered 2022-05-16 – 2022-05-20 (×9): 3 mL via INTRAVENOUS

## 2022-05-15 MED ORDER — IODIXANOL 320 MG/ML IV SOLN
INTRAVENOUS | Status: DC | PRN
  Administered 2022-05-15: 60 mL

## 2022-05-15 MED ORDER — SODIUM CHLORIDE 0.9 % IV SOLN: 250.0000 mL | INTRAVENOUS | Status: DC | PRN

## 2022-05-15 MED ORDER — ROSUVASTATIN CALCIUM 20 MG PO TABS
20.0000 mg | ORAL_TABLET | Freq: Every day | ORAL | Status: DC
  Filled 2022-05-15: qty 1

## 2022-05-15 MED ORDER — MIDAZOLAM HCL 2 MG/2ML IJ SOLN: 1.0000 mg | INTRAMUSCULAR | Status: DC | PRN

## 2022-05-15 MED ORDER — HYDROMORPHONE HCL 1 MG/ML IJ SOLN
INTRAMUSCULAR | Status: DC | PRN
  Administered 2022-05-15: .5 mg via INTRAVENOUS

## 2022-05-15 MED ORDER — LABETALOL HCL 5 MG/ML IV SOLN: 10.0000 mg | INTRAVENOUS | Status: DC | PRN

## 2022-05-15 MED ORDER — METHYLPREDNISOLONE SODIUM SUCC 125 MG IJ SOLR
INTRAMUSCULAR | Status: AC
  Filled 2022-05-15: qty 2

## 2022-05-15 MED ORDER — MORPHINE SULFATE (PF) 4 MG/ML IV SOLN
5.0000 mg | INTRAVENOUS | Status: DC | PRN
  Administered 2022-05-15 – 2022-05-16 (×6): 5 mg via INTRAVENOUS
  Filled 2022-05-15 (×6): qty 2

## 2022-05-15 MED ORDER — HEPARIN (PORCINE) 25000 UT/250ML-% IV SOLN
INTRAVENOUS | Status: AC
  Filled 2022-05-15: qty 250

## 2022-05-15 SURGICAL SUPPLY — 34 items
BIOPATCH RED 1 DISK 7.0 (GAUZE/BANDAGES/DRESSINGS) IMPLANT
CANNULA 5F STIFF (CANNULA) IMPLANT
CATH ACCU-VU SIZ PIG 5F 70CM (CATHETERS) IMPLANT
CATH INFUS 90CMX20CM (CATHETERS) IMPLANT
CATH MICROCATH PRGRT 2.8F 110 (CATHETERS) IMPLANT
CATH SLIP 5FR .038X65 KMP (CATHETERS) ×2
CATH SLIP 5FR 0.38 X 40 KMP (CATHETERS) IMPLANT
CATH TEMPO 5F RIM 65CM (CATHETERS) IMPLANT
COVER PROBE ULTRASOUND 5X96 (MISCELLANEOUS) IMPLANT
DEVICE STARCLOSE SE CLOSURE (Vascular Products) IMPLANT
DEVICE TORQUE .025-.038 (MISCELLANEOUS) IMPLANT
GAUZE SPONGE 4X4 12PLY STRL (GAUZE/BANDAGES/DRESSINGS) IMPLANT
GLIDECATH 4FR STR (CATHETERS) IMPLANT
GLIDEWIRE ADV .035X180CM (WIRE) IMPLANT
GLIDEWIRE STIFF .35X180X3 HYDR (WIRE) IMPLANT
GUIDEWIRE ADV .018X180CM (WIRE) IMPLANT
GUIDEWIRE ANGLED .035 180CM (WIRE) IMPLANT
GUIDEWIRE VASC STIFF .038X260 (WIRE) IMPLANT
KIT CV MULTILUMEN 7FR 20 (SET/KITS/TRAYS/PACK) ×1
KIT CV MULTILUMEN 7FR 20 SUB (SET/KITS/TRAYS/PACK) IMPLANT
KIT MICROPUNCTURE NIT STIFF (SHEATH) IMPLANT
MARKER SKIN DUAL TIP RULER LAB (MISCELLANEOUS) IMPLANT
MICROCATH PROGREAT 2.8F 110 CM (CATHETERS) ×1
NDL ENTRY 21GA 7CM ECHOTIP (NEEDLE) IMPLANT
NEEDLE ENTRY 21GA 7CM ECHOTIP (NEEDLE) ×1 IMPLANT
PACK ANGIOGRAPHY (CUSTOM PROCEDURE TRAY) ×1 IMPLANT
SET INTRO CAPELLA COAXIAL (SET/KITS/TRAYS/PACK) IMPLANT
SHEATH BRITE TIP 5FRX11 (SHEATH) IMPLANT
SHEATH BRITE TIP 6FR X 23 (SHEATH) IMPLANT
SHEATH BRITE TIP 6FRX11 (SHEATH) IMPLANT
SUT SILK 0 FSL (SUTURE) IMPLANT
WIRE G 018X200 V18 (WIRE) IMPLANT
WIRE GUIDERIGHT .035X150 (WIRE) IMPLANT
WIRE SPARTACORE .014X300CM (WIRE) IMPLANT

## 2022-05-15 NOTE — Progress Notes (Cosign Needed Addendum)
NAME:  [name], MRN:  [id number], DOB:  April 24, 1965, LOS: 0 ADMISSION DATE:  05/15/2022, CONSULTATION DATE: 05/15/22 REFERRING MD: Dr. Delana Meyer, CHIEF COMPLAINT:  Bilateral Lower Extremity Rest Pain    History of Present Illness:  57 yo male with severe atherosclerotic changes of bilateral lower extremities with rest pain with associated with pre ulcerative changes and impending tissue loss of both lower extremities.  He presented to [facility] on 03/12 for a bilateral angio with thrombolysis and bilateral peripheral vascular thrombectomy.  Postop pt admitted to ICU for thrombolytic infusion and heparin gtt overnight in preparation for repeat arteriogram on 03/13.  PCCM team consulted to assist with management.    Pertinent  Medical History  DJD Kidney Stones  HLD Lower Back Pain  PAD  PVD  Right Inguinal Hernia  Chronic Pain Syndrome   Significant Hospital Events: Including procedures, antibiotic start and stop dates in addition to other pertinent events   03/12: Pt admitted to ICU s/p bilateral angio with thrombolysis and bilateral peripheral vascular thrombectomy   Interim History / Subjective:  Pt resting comfortably states his pain has improved following administration of pain medication prior to arrival to ICU   Objective   Blood pressure 115/71, resp. rate 13, height '5\' 5"'$  (1.651 m), weight 79.8 kg, SpO2 100 %.       No intake or output data in the 24 hours ending 05/15/22 1810 Filed Weights   05/15/22 1453  Weight: 79.8 kg    Examination: General: Acutely-ill appearing male, NAD resting comfortably  HENT: Supple, no JVD  Lungs: Clear throughout, even, non labored  Cardiovascular: NSR, s1s2, rrr, no m/r/g, unable to palpate/doppler distal pulses  Abdomen: +BS x4, soft, obese, non tender, non distended  Extremities: Normal bulk, moves all extremities, bilateral feet cool to touch Skin: Bilateral groin vascular sites clear of hematoma/bleeding, dressings dry/intact    Neuro: Alert and oriented, follows commands, PERRLA  GU: Indwelling foley catheter draining yellow urine   Resolved Hospital Problem list     Assessment & Plan:  Atherosclerotic occlusive disease bilateral lower extremities with rest pain bilateral lower extremities; complication of vascular device with thrombosis aortobifemoral bypass graft s/p angiography with thrombolytic therapy  - Prn morphine for pain management  - Maintain map >65 - Continue heparin gtt and thrombolytic therapy overnight per vascular surgery  - Monitor for s/sx of bleeding  - Serial CBC and fibrinogen - Bleeding precautions  - Continue bedrest   Best Practice (right click and "Reselect all SmartList Selections" daily)   Diet/type: clear liquids DVT prophylaxis: systemic heparin GI prophylaxis: H2B Lines: Central line Foley:  Yes, and it is still needed Code Status:  full code Last date of multidisciplinary goals of care discussion [N/A]  Labs   CBC: No results for input(s): "WBC", "NEUTROABS", "HGB", "HCT", "MCV", "PLT" in the last 168 hours.  Basic Metabolic Panel: Recent Labs  Lab 05/15/22 1423  BUN 16  CREATININE 0.68   GFR: Estimated Creatinine Clearance: 99.1 mL/min (by C-G formula based on SCr of 0.68 mg/dL). No results for input(s): "PROCALCITON", "WBC", "LATICACIDVEN" in the last 168 hours.  Liver Function Tests: No results for input(s): "AST", "ALT", "ALKPHOS", "BILITOT", "PROT", "ALBUMIN" in the last 168 hours. No results for input(s): "LIPASE", "AMYLASE" in the last 168 hours. No results for input(s): "AMMONIA" in the last 168 hours.  ABG No results found for: "PHART", "PCO2ART", "PO2ART", "HCO3", "TCO2", "ACIDBASEDEF", "O2SAT"   Coagulation Profile: No results for input(s): "INR", "PROTIME" in the last  168 hours.  Cardiac Enzymes: No results for input(s): "CKTOTAL", "CKMB", "CKMBINDEX", "TROPONINI" in the last 168 hours.  HbA1C: No results found for: "HGBA1C"  CBG: No  results for input(s): "GLUCAP" in the last 168 hours.  Review of Systems:   Gen: b fever, chills, weight change, fatigue, night sweats HEENT: Denies blurred vision, double vision, hearing loss, tinnitus, sinus congestion, rhinorrhea, sore throat, neck stiffness, dysphagia PULM: Denies shortness of breath, cough, sputum production, hemoptysis, wheezing CV: Denies chest pain, edema, orthopnea, paroxysmal nocturnal dyspnea, palpitations GI: Denies abdominal pain, nausea, vomiting, diarrhea, hematochezia, melena, constipation, change in bowel habits GU: Denies dysuria, hematuria, polyuria, oliguria, urethral discharge Endocrine: Denies hot or cold intolerance, polyuria, polyphagia or appetite change Derm: Denies rash, dry skin, scaling or peeling skin change Heme: Denies easy bruising, bleeding, bleeding gums Neuro: headache, numbness, weakness, slurred speech, loss of memory or consciousness   Past Medical History:  He,  has a past medical history of Carpal tunnel syndrome of left wrist, DJD (degenerative joint disease), History of kidney stones, Hyperlipidemia, Leg pain, Low back pain (04/04/2017), PAD (peripheral artery disease) ([place]), Peripheral vascular disease ([place]), and Right inguinal hernia.   Surgical History:   Past Surgical History:  Procedure Laterality Date   AORTA - BILATERAL FEMORAL ARTERY BYPASS GRAFT N/A 05/22/2017   Procedure: AORTA BIFEMORAL BYPASS GRAFT, Aortic bilateral, and internal ischemia;  Surgeon: Katha Cabal, MD;  Location: [facility] ORS;  Service: Vascular;  Laterality: N/A;   CENTRAL VENOUS CATHETER INSERTION Left 05/22/2017   Procedure: INSERTION CENTRAL LINE ADULT Left Internal Jugular;  Surgeon: Katha Cabal, MD;  Location: [facility] ORS;  Service: Vascular;  Laterality: Left;   COLONOSCOPY WITH PROPOFOL N/A 03/27/2017   Procedure: COLONOSCOPY WITH PROPOFOL;  Surgeon: Robert Bellow, MD;  Location: [facility] ENDOSCOPY;  Service: Endoscopy;  Laterality: N/A;    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   Procedure: ENDOSCOPIC LEFT CARPAL TUNNEL RELEASE AND 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 He has a 7.50 pack-year smoking history. He has been exposed to tobacco smoke. He has never used smokeless tobacco. He reports that he does not drink alcohol and does not use drugs.   Family History:  His family history includes Alzheimer's disease in his father; Diabetes in his mother; Heart disease in his mother.   Allergies Allergies  Allergen Reactions   Versed [Midazolam] Rash    raised  red whelps and burning right iv site after recieved fentanyl and versed   Fentanyl Hives    raised red whelps and burning right iv site after recieved fentanyl and versed     Home Medications  Prior to Admission medications   Medication Sig Start Date End Date Taking? Authorizing Provider  aspirin EC 81 MG tablet Take 81 mg by mouth daily. Swallow whole.   Yes [provider]  HYDROcodone-acetaminophen (NORCO/VICODIN) 5-325 MG tablet Take  1 tablet by mouth every 6 (six) hours as needed for severe pain. Must last 30 days. 05/06/22 06/05/22 Yes Gillis Santa, MD  propranolol (INDERAL) 10 MG tablet Take 10 mg by mouth 3 (three) times daily. 08/03/21  Yes [provider]  rosuvastatin (CRESTOR) 20 MG tablet Take 20 mg by mouth daily. 08/12/21  Yes  [provider]  acetaminophen (TYLENOL) 325 MG tablet Take 650 mg by mouth daily as needed for moderate pain.    [provider]  ibuprofen (ADVIL) 800 MG tablet Take 800 mg by mouth every 8 (eight) hours as needed for moderate pain.    [provider]     Critical care time: 45 minutes       Donell Beers, [place] Pager [phone] (please enter 7 digits) PCCM Consult Pager [phone] (please enter 7 digits)

## 2022-05-15 NOTE — Interval H&P Note (Signed)
History and Physical Interval Note:  05/15/2022 2:21 PM  [name]  has presented today for surgery, with the diagnosis of Bilateral angio w thrombolysis and possible intervention ASO w rest pain  Critical limb ischemia PERSIAN Interpreter Requested.  The various methods of treatment have been discussed with the patient and family. After consideration of risks, benefits and other options for treatment, the patient has consented to  Procedure(s): PERIPHERAL VASCULAR THROMBECTOMY (Bilateral) as a surgical intervention.  The patient's history has been reviewed, patient examined, no change in status, stable for surgery.  I have reviewed the patient's chart and labs.  Questions were answered to the patient's satisfaction.     [name]

## 2022-05-15 NOTE — Progress Notes (Signed)
Assessment of right and left groin with receiving nurse and Hinton Dyer NP.

## 2022-05-15 NOTE — Op Note (Signed)
[name]  Percutaneous Study/Intervention Procedural Note   Date of Surgery: 05/15/2022,6:23 PM  Surgeon:Leonilda Cozby, Dolores Lory   Pre-operative Diagnosis: Atherosclerotic occlusive disease bilateral lower extremities with rest pain bilateral lower extremities; complication of vascular device with thrombosis aortobifemoral bypass graft  Post-operative diagnosis:  Same  Procedure(s) Performed:  1.  Introduction catheter into aorta right femoral approach  2.  Introduction catheter into left internal iliac artery left femoral approach  3.  Pelvic angiography bilateral femoral approach  4.  Initiation of overnight infusion thrombolysis right femoral approach  5.  Insertion triple-lumen catheter right common femoral vein with ultrasound guidance  6.  StarClose left common femoral artery   Anesthesia: Conscious sedation was administered by the interventional radiology RN under my direct supervision. IV Versed plus fentanyl were utilized. Continuous ECG, pulse oximetry and blood pressure was monitored throughout the entire procedure. Conscious sedation was administered for a total of 2 hours 26 minutes 34 seconds.  Sheath: 6 French 11 cm Pinnacle sheath right common femoral artery retrograde, 6 French 11 cm Pinnacle sheath left common femoral  Contrast: 60 cc   Fluoroscopy Time: 23.6 minutes  Indications: Patient presented to the the office for follow-up he is approximately 6 years status post aortobifemoral bypass graft.  Recently he began having increased pain of both lower extremities more left related but actually he is describing symptoms of both lower extremities.  Workup has demonstrated thrombosis of his aortobifemoral bypass graft.  He is undergoing angiography with the intention for thrombolytic therapy for salvage of his graft and correction of underlying abnormalities.  Procedure:  Douglas Mirhosseiniis a 57 y.o. male who was identified and appropriate  procedural time out was performed.  The patient was then placed supine on the table and prepped and draped in the usual sterile fashion.  Ultrasound was used to evaluate the right common femoral artery.  It was echolucent and pulsatile indicating it is patent .  An ultrasound image was acquired for the permanent record.  A micropuncture needle was used to access the right common femoral artery under direct ultrasound guidance.  The microwire was then advanced under fluoroscopic guidance without difficulty followed by the micro-sheath.  A 0.035 J wire was advanced without resistance and a 5Fr sheath was placed.  Using a Kumpe catheter and an advantage wire the catheter wire combination were advanced into the abdominal aorta and hand-injection contrast confirmed patency at the level of the renal arteries.  Attention was then turned to the left common femoral.  Ultrasound was used to evaluate the left common femoral that was echolucent and faintly pulsatile consistent with the inflow occlusion.  1% lidocaine was infiltrated in soft tissues under direct ultrasound visualization and a microneedle was inserted into the common femoral artery with continuous ultrasound guidance.  Image was recorded for the permanent record.  The microwire was then advanced but this was somewhat difficult, initially the wire seemed to be directed up the circumflex branch.  Ultimately under fluoroscopic guidance I was able to negotiate the wire up to the level of the iliac bifurcation.  Initially a 5 French sheath was put in but then a 6 [place] sheath was put in.  Hand-injection contrast at this point demonstrated the external iliac artery appears to be very diseased.  The internal iliac artery is patent.  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  Over the next approximate 2 hours multiple wires multiple catheters were used in attempt to cross  the left limb of the bypass graft.  Unfortunately unlike the  right side where the Kumpe and advantage wire easily coursed through I was unable to get secure wire access on the left.  Given my options I elected to perform a StarClose on the left and to observe this area.  If excellent hemostasis was achieved which it was then I elected to advance a 20 cm long infusion length 90 cm infusion catheter with its tip in the visceral aorta extending into the proximal external iliac on the right which is patent.  I will initiate thrombolysis.  Ultrasound was returned to the sterile field the right common femoral vein was identified it was echolucent and compressible indicating patency.  Image was recorded for the permanent record.  1% lidocaine was Tymtran soft tissues microneedle was inserted under direct ultrasound guidance into the common femoral vein microwire followed by microsheath small incision made dilator passed over the 035 wire followed by the triple-lumen catheter all 3 lm aspirated and flushed well.  Triple-lumen catheter and the 6 French sheath on the right were secured with multiple 0 silk suture.  Sterile dressing was applied.  Findings:   Aortogram: Imaging from the entire case demonstrates the visceral segment of the abdominal aorta is widely patent.  Both renal arteries are widely patent.  There appears to be occlusion up to the level of the renal arteries this does appear to be thrombus which would be consistent.  The graft itself is nonvisualized consistent with occlusion throughout its entire course including both the right and left limbs.  On the right the internal and external iliac arteries appear widely patent and free of disease and fill a widely patent common femoral.  Origins of the profunda femoris and SFA appear widely patent.  On the left the common femoral is widely patent.  Initially the wire advances up to a circumflex branch as it is being directed that way suspicious for occlusive disease in the left common iliac.  This would certainly  explain why his left leg is worse than his right.  Imaging throughout the case demonstrates there is certainly occlusive disease in the left external iliac but there is also a dissection plane created likely from the catheters and wires in an attempt to cross the left limb.  The left internal iliac artery is widely patent.     Disposition: Patient was taken to the recovery room in stable condition having tolerated the procedure well.  Belenda Cruise Vashti Bolanos 05/15/2022,6:23 PM

## 2022-05-16 ENCOUNTER — Encounter
Admission: AD | Disposition: A | Discharge status: Home / Self Care | Payer: Self-pay | Source: Ambulatory Visit | Attending: Vascular Surgery

## 2022-05-16 ENCOUNTER — Encounter: Payer: Self-pay | Admitting: Vascular Surgery

## 2022-05-16 ENCOUNTER — Other Ambulatory Visit: Payer: Self-pay

## 2022-05-16 DIAGNOSIS — Z9889 Other specified postprocedural states: Secondary | ICD-10-CM

## 2022-05-16 HISTORY — PX: LOWER EXTREMITY INTERVENTION: CATH118252

## 2022-05-16 LAB — HEPARIN LEVEL (UNFRACTIONATED)
Heparin Unfractionated: <0.1 IU/mL — ABNORMAL LOW (ref 0.30–0.70)
Heparin Unfractionated: 0.2 IU/mL — ABNORMAL LOW (ref 0.30–0.70)
Heparin Unfractionated: 0.5 IU/mL (ref 0.30–0.70)

## 2022-05-16 LAB — FIBRINOGEN
Fibrinogen: 151 mg/dL — ABNORMAL LOW (ref 210–475)
Fibrinogen: 138 mg/dL — ABNORMAL LOW (ref 210–475)
Fibrinogen: 146 mg/dL — ABNORMAL LOW (ref 210–475)
Fibrinogen: 148 mg/dL — ABNORMAL LOW (ref 210–475)

## 2022-05-16 LAB — BASIC METABOLIC PANEL
Anion gap: 4 — ABNORMAL LOW (ref 5–15)
BUN: 17 mg/dL (ref 6–20)
CO2: 24 mmol/L (ref 22–32)
Calcium: 8.8 mg/dL — ABNORMAL LOW (ref 8.9–10.3)
Chloride: 108 mmol/L (ref 98–111)
Creatinine, Ser: 0.72 mg/dL (ref 0.61–1.24)
GFR, Estimated: >60 mL/min (ref >60)
Glucose, Bld: 129 mg/dL — ABNORMAL HIGH (ref 70–99)
Potassium: 4.1 mmol/L (ref 3.5–5.1)
Sodium: 136 mmol/L (ref 135–145)

## 2022-05-16 LAB — CBC
HCT: 40.1 % (ref 39.0–52.0)
HCT: 39.2 % (ref 39.0–52.0)
HCT: 38.2 % — ABNORMAL LOW (ref 39.0–52.0)
HCT: 35.4 % — ABNORMAL LOW (ref 39.0–52.0)
Hemoglobin: 13.7 g/dL (ref 13.0–17.0)
Hemoglobin: 13.2 g/dL (ref 13.0–17.0)
Hemoglobin: 12.8 g/dL — ABNORMAL LOW (ref 13.0–17.0)
Hemoglobin: 12 g/dL — ABNORMAL LOW (ref 13.0–17.0)
MCH: 29.2 pg (ref 26.0–34.0)
MCH: 29 pg (ref 26.0–34.0)
MCH: 28.8 pg (ref 26.0–34.0)
MCH: 29.3 pg (ref 26.0–34.0)
MCHC: 34.2 g/dL (ref 30.0–36.0)
MCHC: 33.7 g/dL (ref 30.0–36.0)
MCHC: 33.5 g/dL (ref 30.0–36.0)
MCHC: 33.9 g/dL (ref 30.0–36.0)
MCV: 85.5 fL (ref 80.0–100.0)
MCV: 86.2 fL (ref 80.0–100.0)
MCV: 85.8 fL (ref 80.0–100.0)
MCV: 86.3 fL (ref 80.0–100.0)
Platelets: 234 10*3/uL (ref 150–400)
Platelets: 212 10*3/uL (ref 150–400)
Platelets: 180 10*3/uL (ref 150–400)
Platelets: 201 10*3/uL (ref 150–400)
RBC: 4.69 MIL/uL (ref 4.22–5.81)
RBC: 4.55 MIL/uL (ref 4.22–5.81)
RBC: 4.45 MIL/uL (ref 4.22–5.81)
RBC: 4.1 MIL/uL — ABNORMAL LOW (ref 4.22–5.81)
RDW: 13.1 % (ref 11.5–15.5)
RDW: 12.9 % (ref 11.5–15.5)
RDW: 13 % (ref 11.5–15.5)
RDW: 13.1 % (ref 11.5–15.5)
WBC: 5.7 10*3/uL (ref 4.0–10.5)
WBC: 8.6 10*3/uL (ref 4.0–10.5)
WBC: 10.5 10*3/uL (ref 4.0–10.5)
WBC: 15 10*3/uL — ABNORMAL HIGH (ref 4.0–10.5)
nRBC: 0 % (ref 0.0–0.2)
nRBC: 0 % (ref 0.0–0.2)
nRBC: 0 % (ref 0.0–0.2)
nRBC: 0 % (ref 0.0–0.2)

## 2022-05-16 LAB — COMPREHENSIVE METABOLIC PANEL
ALT: 35 U/L (ref 0–44)
AST: 39 U/L (ref 15–41)
Albumin: 3.7 g/dL (ref 3.5–5.0)
Alkaline Phosphatase: 62 U/L (ref 38–126)
Anion gap: 7 (ref 5–15)
BUN: 18 mg/dL (ref 6–20)
CO2: 24 mmol/L (ref 22–32)
Calcium: 8.7 mg/dL — ABNORMAL LOW (ref 8.9–10.3)
Chloride: 103 mmol/L (ref 98–111)
Creatinine, Ser: 0.78 mg/dL (ref 0.61–1.24)
GFR, Estimated: >60 mL/min (ref >60)
Glucose, Bld: 170 mg/dL — ABNORMAL HIGH (ref 70–99)
Potassium: 3.8 mmol/L (ref 3.5–5.1)
Sodium: 134 mmol/L — ABNORMAL LOW (ref 135–145)
Total Bilirubin: 1.9 mg/dL — ABNORMAL HIGH (ref 0.3–1.2)
Total Protein: 6.6 g/dL (ref 6.5–8.1)

## 2022-05-16 LAB — MAGNESIUM: Magnesium: 2 mg/dL (ref 1.7–2.4)

## 2022-05-16 SURGERY — LOWER EXTREMITY INTERVENTION
Anesthesia: Moderate Sedation | Laterality: Bilateral

## 2022-05-16 MED ORDER — CEFAZOLIN SODIUM-DEXTROSE 2-4 GM/100ML-% IV SOLN
INTRAVENOUS | Status: AC
  Administered 2022-05-16: 2 g via INTRAVENOUS
  Filled 2022-05-16: qty 100

## 2022-05-16 MED ORDER — METHYLPREDNISOLONE SODIUM SUCC 125 MG IJ SOLR
INTRAMUSCULAR | Status: AC
  Administered 2022-05-16: 125 mg via INTRAVENOUS
  Filled 2022-05-16: qty 2

## 2022-05-16 MED ORDER — HEPARIN SODIUM (PORCINE) 1000 UNIT/ML IJ SOLN
INTRAMUSCULAR | Status: DC | PRN
  Administered 2022-05-16: 4000 [IU] via INTRAVENOUS

## 2022-05-16 MED ORDER — HEPARIN (PORCINE) 25000 UT/250ML-% IV SOLN
1250.0000 [IU]/h | INTRAVENOUS | Status: DC
  Administered 2022-05-17: 1250 [IU]/h via INTRAVENOUS
  Filled 2022-05-16: qty 250

## 2022-05-16 MED ORDER — IODIXANOL 320 MG/ML IV SOLN
INTRAVENOUS | Status: DC | PRN
  Administered 2022-05-16: 50 mL

## 2022-05-16 MED ORDER — SODIUM CHLORIDE 0.9 % IV SOLN
0.2500 mg/h | INTRAVENOUS | Status: DC
  Filled 2022-05-16: qty 10

## 2022-05-16 MED ORDER — MIDAZOLAM HCL 2 MG/2ML IJ SOLN
INTRAMUSCULAR | Status: DC | PRN
  Administered 2022-05-16: .5 mg via INTRAVENOUS
  Administered 2022-05-16: 2 mg via INTRAVENOUS

## 2022-05-16 MED ORDER — HYDROMORPHONE HCL 1 MG/ML IJ SOLN
INTRAMUSCULAR | Status: AC
  Filled 2022-05-16: qty 1

## 2022-05-16 MED ORDER — SODIUM CHLORIDE 0.9 % IV SOLN
0.5000 mg/h | INTRAVENOUS | Status: DC
  Administered 2022-05-16: 0.5 mg/h
  Filled 2022-05-16: qty 10

## 2022-05-16 MED ORDER — HYDROMORPHONE HCL 1 MG/ML IJ SOLN
1.0000 mg | INTRAMUSCULAR | Status: DC | PRN
  Administered 2022-05-17: 1 mg via INTRAVENOUS
  Filled 2022-05-16 (×2): qty 1

## 2022-05-16 MED ORDER — MIDAZOLAM HCL 2 MG/2ML IJ SOLN
INTRAMUSCULAR | Status: AC
  Filled 2022-05-16: qty 2

## 2022-05-16 MED ORDER — HEPARIN (PORCINE) 25000 UT/250ML-% IV SOLN: 400.0000 [IU]/h | INTRAVENOUS | Status: DC

## 2022-05-16 MED ORDER — FAMOTIDINE 20 MG PO TABS
ORAL_TABLET | ORAL | Status: AC
  Administered 2022-05-16: 40 mg via ORAL
  Filled 2022-05-16: qty 2

## 2022-05-16 MED ORDER — FENTANYL CITRATE (PF) 100 MCG/2ML IJ SOLN
INTRAMUSCULAR | Status: AC
  Filled 2022-05-16: qty 2

## 2022-05-16 MED ORDER — HEPARIN SODIUM (PORCINE) 1000 UNIT/ML IJ SOLN
INTRAMUSCULAR | Status: AC
  Filled 2022-05-16: qty 10

## 2022-05-16 MED ORDER — CHLORHEXIDINE GLUCONATE CLOTH 2 % EX PADS
6.0000 | MEDICATED_PAD | Freq: Every day | CUTANEOUS | Status: DC
  Administered 2022-05-16 – 2022-05-19 (×3): 6 via TOPICAL

## 2022-05-16 MED ORDER — SODIUM CHLORIDE 0.9 % IV SOLN: INTRAVENOUS | Status: DC

## 2022-05-16 MED ORDER — HEPARIN (PORCINE) 25000 UT/250ML-% IV SOLN: 1250.0000 [IU]/h | INTRAVENOUS | Status: DC

## 2022-05-16 MED ORDER — MORPHINE SULFATE (PF) 2 MG/ML IV SOLN: 2.0000 mg | INTRAVENOUS | Status: DC | PRN

## 2022-05-16 MED ORDER — HEPARIN (PORCINE) 25000 UT/250ML-% IV SOLN
400.0000 [IU]/h | INTRAVENOUS | Status: DC
  Administered 2022-05-16: 400 [IU]/h via INTRAVENOUS
  Filled 2022-05-16: qty 250

## 2022-05-16 MED ORDER — STERILE WATER FOR INJECTION IJ SOLN
INTRAMUSCULAR | Status: AC
  Filled 2022-05-16: qty 10

## 2022-05-16 MED ORDER — FENTANYL CITRATE (PF) 100 MCG/2ML IJ SOLN
INTRAMUSCULAR | Status: DC | PRN
  Administered 2022-05-16: 25 ug via INTRAVENOUS
  Administered 2022-05-16: 50 ug via INTRAVENOUS
  Administered 2022-05-16: 25 ug via INTRAVENOUS

## 2022-05-16 MED ORDER — DIPHENHYDRAMINE HCL 50 MG/ML IJ SOLN
INTRAMUSCULAR | Status: AC
  Administered 2022-05-16: 50 mg
  Filled 2022-05-16: qty 1

## 2022-05-16 SURGICAL SUPPLY — 25 items
CATH ANGIO 5F PIGTAIL 65CM (CATHETERS) IMPLANT
CATH INFUS 90CMX20CM (CATHETERS) IMPLANT
CATH MICROCATH PRGRT 2.8F 110 (CATHETERS) IMPLANT
CATH SLIP 5FR .038X65 KMP (CATHETERS) ×1
CATH SLIP 5FR 0.38 X 40 KMP (CATHETERS) IMPLANT
CATH TEMPO 5F RIM 65CM (CATHETERS) IMPLANT
CATH VS15FR (CATHETERS) IMPLANT
COVER PROBE ULTRASOUND 5X96 (MISCELLANEOUS) IMPLANT
DEVICE TORQUE .025-.038 (MISCELLANEOUS) IMPLANT
ENSNARE 9-15 (MISCELLANEOUS) IMPLANT
GAUZE SPONGE 4X4 12PLY STRL (GAUZE/BANDAGES/DRESSINGS) IMPLANT
GUIDEWIRE ANGLED .035 180CM (WIRE) IMPLANT
MICROCATH PROGREAT 2.8F 110 CM (CATHETERS) ×1
NDL ENTRY 21GA 7CM ECHOTIP (NEEDLE) IMPLANT
NEEDLE ENTRY 21GA 7CM ECHOTIP (NEEDLE) ×1 IMPLANT
PACK ANGIOGRAPHY (CUSTOM PROCEDURE TRAY) IMPLANT
SET INTRO CAPELLA COAXIAL (SET/KITS/TRAYS/PACK) IMPLANT
SHEATH BRITE TIP 5FRX11 (SHEATH) IMPLANT
SHEATH BRITE TIP 6FRX11 (SHEATH) IMPLANT
SUT MNCRL AB 4-0 PS2 18 (SUTURE) IMPLANT
SUT SILK 0 FSL (SUTURE) IMPLANT
SYR MEDRAD MARK 7 150ML (SYRINGE) IMPLANT
TUBING CONTRAST HIGH PRESS 72 (TUBING) IMPLANT
WIRE GUIDERIGHT .035X150 (WIRE) IMPLANT
WIRE MAGIC TORQUE 315CM (WIRE) IMPLANT

## 2022-05-16 NOTE — Progress Notes (Signed)
Pt. Asking a lot of questions pre procedure. Dr. Delana Meyer at [facility] speaking with pt. Depth re: procedure & results of yesterday.

## 2022-05-16 NOTE — Progress Notes (Signed)
MD aware of possible "reaction" to versed & fentanyl. Pt. Premedicated for possible allergies. MD wishes to sedate pt. With versed & fentanyl today.

## 2022-05-16 NOTE — Plan of Care (Addendum)
LF sheath site came to floor with scant drop of blood on dressing (from PACU). By 1100 2nd bleeding site on dressing noted (outlined) By 1300 Same bleeding site on dressing had expanded beyond 1100 marking and a 3rd small bleeding sited noted (remarked and notified vascular.)  Vascular coming to round and assess 1330 - PA rounded.  D/t patient LF leg still being vascularly blocked - "weeping/pooling"  at site is mainly expected.  Will continue to assess and notify if bleeding continues, especially if in an uncontrolled fashion.  1430 Though bleeding continues outside last marking - Hemoglobin is 12.8.  Patient remains asymptomatic, vitals and neuro status are stable - Pulses unchanged.  1800 - Continued bleeding noted. Remarked drainage at 1800.  Currently 1/2 of LF Sheath dressing is saturated.  Still, patient's vitals, neuro and pulses are stable and asymptomatic.  Patient has needed pain meds (morphine) 2x in 3hrs for Rt and left foot pain. Vascular Dr. Informed of consistent bleeding and status - though stable.   1845 Verbal order giving to reinforce dressing as needed and continue to assess for excessive drainage.

## 2022-05-16 NOTE — Op Note (Signed)
[name]  Percutaneous Study/Intervention Procedural Note   Date of Surgery: 05/16/2022,9:05 AM  Surgeon:Elah Avellino, Dolores Lory   Pre-operative Diagnosis: Atherosclerotic occlusive disease bilateral lower extremities with rest pain; complication vascular device with thrombosis of aortobiiliac bypass graft  Post-operative diagnosis:  Same  Procedure(s) Performed:  1.  Abdominal aortogram through existing right femoral catheter  2.  Catheter placement into the left common femoral from the right common femoral second-order  3.  Placement of catheter into the aorta from left femoral approach  4.  Ultrasound-guided access to the left common femoral for placement of a 6 French sheath  5.  Continued thrombolytic therapy from bilateral femoral artery approaches   Anesthesia: Conscious sedation was administered by the interventional radiology RN under my direct supervision. IV Versed plus fentanyl were utilized. Continuous ECG, pulse oximetry and blood pressure was monitored throughout the entire procedure. Conscious sedation was administered for a total of 1 hour 2 minutes 18 seconds.  Sheath: Placement of a 6 French 11 cm Pinnacle sheath left common femoral retrograde; utilization of the existing 6 French sheath right common femoral  Contrast: 50 cc   Fluoroscopy Time: 8.3 minutes  Indications: He is being returned to the angio suite for follow-up angiography and treatment.  He is undergoing thrombolytic therapy  Procedure:  Daaron Mirhosseiniis a 57 y.o. male who was identified and appropriate procedural time out was performed.  The patient was then placed supine on the table and prepped and draped in the usual sterile fashion.    The existing catheters and sheaths of the right groin are prepped and draped in a sterile fashion.  A wire was advanced through the thrombolysis catheter.  The lytic catheter was then exchanged for a pigtail catheter was just positioned at  the level of T12.  Bolus injection of contrast was then performed within the abdominal aorta.  The pigtail catheter is then repositioned 2 more times to better demonstrate the anatomy which shows persistent thrombus in the infrarenal segment as well as some thrombus within the body of the graft and within the right limb however the vast majority of the previously occlusive thrombus within the main body of the graft and the right limb of the graft has been lysed and there is now flow on the right side.  There is also a stump of the left limb noted.  Using first a rim catheter but then a V S1 catheter it is reformed and used to hook the bifurcation of the graft.  A combination of a floppy Glidewire and a prograde catheter then negotiated down into the common femoral.  At this point my plan is to snare this wire and then I can place a left-sided sheath and left sided catheter and continue thrombolysis.  Ultrasound was used to evaluate the left common femoral artery.  It was echolucent and pulsatile indicating it is patent .  An ultrasound image was acquired for the permanent record.  A micropuncture needle was used to access the left common femoral artery under direct ultrasound guidance.  The microwire was then advanced under fluoroscopic guidance without difficulty followed by the micro-sheath.  A 0.035 J wire was advanced without resistance and a 6Fr sheath was placed.    A snare was then placed in the external iliac artery and the prograde catheter was negotiated through the snare and then captured and pulled extracorporeally.  With tension on both ends of the prograde catheter the V S1 catheter is then advanced so  that is now sticking out through the left sheath.  A Magic torque wire was then advanced in a retrograde fashion up into the aorta and the V S1 catheter pullback.  Kumpe catheter was then advanced over the Magic torque wire up into the descending thoracic aorta.  Wire is then advanced from the  right side as well.  Further imaging is then performed and the lytic catheter is returned on the right and a new catheter was also placed on the left.  These are secured in the usual fashion and we will reinitiate thrombolysis overnight.  Findings:   Aortogram: There is now flow through the infrarenal aorta and the main body of the graft as well as the right limb.  There is persistent thrombus noted in the main body of the graft and the right limb but this is relatively speaking minimal.  There is significant thrombus still noted in the infrarenal aorta.  Right Lower Extremity: Right limb of the graft and the anastomosis are essentially patent external and internal iliac arteries are widely patent.  Left Lower Extremity: Limb of the graft remains occluded the internal iliac artery is still visualized the external iliac artery appears to be occluded.    Disposition: Patient was taken to the recovery room in stable condition having tolerated the procedure well.  Belenda Cruise Mandrell Vangilder 05/16/2022,9:05 AM

## 2022-05-16 NOTE — Progress Notes (Addendum)
Called bedside as patient developed 10/10 L lower abdominal pain with firm edema present. Bedside assessment suspicious for developing hematoma. No ecchymosis present, however firm distention noted in Left lower abdomen with some extension into groin area above catheter site. No extension of pain or edema on back. Pain at rest and severe pain with palpation. Oozing at site continuing but not worsening.   TPA stopped and vascular surgery on call alerted to changes, recommendations below: - discontinue TPA - continue 30 ml/ h NS through both sheaths - start systemic heparin with no bolus  - STAT Fibrinogen & CBC ordered, transfuse cryo for fibrinogen <100 / transfuse pRBC's for Hgb < 8, maintain type and screen, serial labs to follow - area marked, no imaging recommended at this time - added Dilaudid for better pain control  Plan discussed with patient bedside. All questions answered at this time.    Domingo Pulse Rust-[place], AGACNP-BC Acute Care Nurse Practitioner [facility]   [phone] / [phone] Please see Amion for pager details.

## 2022-05-16 NOTE — Progress Notes (Addendum)
ANTICOAGULATION CONSULT NOTE - Initial Consult  Pharmacy Consult for Heparin Indication:  atherosclerotic disease bilateral lower extremities (S/P alteplase)   Allergies  Allergen Reactions   Versed [Midazolam] Rash    raised  red whelps and burning right iv site after recieved fentanyl and versed.  Second procedure pt recieved  IV solumedrol IV benedryl and pepcid po and tolerated both.    Fentanyl Hives    raised red whelps and burning right iv site after recieved fentanyl and versed Second procedure pt recieved  IV solumedrol IV benedryl and pepcid po and tolerated both.      Patient Measurements: Height: '5\' 5"'$  (165.1 cm) Weight: 79.8 kg (176 lb) IBW/kg (Calculated) : 61.5 Heparin Dosing Weight: 77.8 kg   Vital Signs: Temp: 98.7 F (37.1 C) (03/13 1600) Temp Source: Oral (03/13 1600) BP: 105/64 (03/13 1900) Pulse Rate: 88 (03/13 1900)  Labs: Recent Labs    05/15/22 1908 05/15/22 2001 05/16/22 0102 05/16/22 0620 05/16/22 1400  HGB  --    < > 13.7 13.2 12.8*  HCT  --    < > 40.1 39.2 38.2*  PLT  --    < > 234 212 180  HEPARINUNFRC  --    < > <0.10* 0.20* 0.50  CREATININE 0.58*  --  0.78 0.72  --    < > = values in this interval not displayed.    Estimated Creatinine Clearance: 99.1 mL/min (by C-G formula based on SCr of 0.72 mg/dL).   Medical History: Past Medical History:  Diagnosis Date   Carpal tunnel syndrome of left wrist    DJD (degenerative joint disease)    History of kidney stones    Hyperlipidemia    Leg pain    Low back pain 04/04/2017   PAD (peripheral artery disease) (HCC)    Peripheral vascular disease (HCC)    Right inguinal hernia     Medications:  Medications Prior to Admission  Medication Sig Dispense Refill Last Dose   acetaminophen (TYLENOL) 325 MG tablet Take 650 mg by mouth daily as needed for moderate pain.   prn at unk   aspirin EC 81 MG tablet Take 81 mg by mouth daily. Swallow whole.   05/15/2022   HYDROcodone-acetaminophen  (NORCO/VICODIN) 5-325 MG tablet Take 1 tablet by mouth every 6 (six) hours as needed for severe pain. Must last 30 days. 120 tablet 0 05/15/2022   ibuprofen (ADVIL) 800 MG tablet Take 800 mg by mouth every 8 (eight) hours as needed for moderate pain.   prn at unk   propranolol (INDERAL) 10 MG tablet Take 10 mg by mouth 3 (three) times daily.   05/15/2022   rosuvastatin (CRESTOR) 20 MG tablet Take 20 mg by mouth daily.   05/15/2022    Assessment: Pharmacy consulted to dose heparin in this 57 year old male admitted to vascular surgery for TPA thrombolysis of bilateral lower extremities due to atherosclerotic occlusion and possible aortobifemoral bypass.  Pt was started on alteplase on 3/13 @ 1004 at 0.5 mg/hr , rate decreased to 0.25 mg/hr at 1813.  Heparin gtt started on 3/13 @ 400 units/hr to run continuously   Pt developed hematoma around 2100.  MD would like to d/c alteplase and increase heparin infusion to achieve goal HL of 0.3 - 0.7 but NO BOLUS.  Heparin infusion held until 3/14 @ 0000 per MD request.   Goal of Therapy:  Heparin level 0.3-0.7 units/ml Monitor platelets by anticoagulation protocol: Yes Fibrinogen >/= 100 (per MD  recommended value)    Plan:  TPA d/c'd on 3/13 @ 2200. Will hold heparin infusion until 3/14 @ 0000 per MD request. Will begin heparin gtt @ 0000 at 1250 units/hr.  - NO BOLUS Will check HL Q6H for first 24 hrs and follow nomogram thereafter.  Will check fibrinogen Q6H and notify MD if fibrinogen is less than 100.   Yolando Gillum D 05/16/2022,9:58 PM

## 2022-05-16 NOTE — Interval H&P Note (Signed)
History and Physical Interval Note:  05/16/2022 8:54 AM  [name]  has presented today for surgery, with the diagnosis of Bilateral leg lysis.  The various methods of treatment have been discussed with the patient and family. After consideration of risks, benefits and other options for treatment, the patient has consented to  Procedure(s): LOWER EXTREMITY INTERVENTION (Bilateral) as a surgical intervention.  The patient's history has been reviewed, patient examined, no change in status, stable for surgery.  I have reviewed the patient's chart and labs.  Questions were answered to the patient's satisfaction.     [name]

## 2022-05-17 ENCOUNTER — Encounter: Payer: Self-pay | Admitting: General Practice

## 2022-05-17 ENCOUNTER — Encounter
Admission: AD | Disposition: A | Discharge status: Home / Self Care | Payer: Self-pay | Source: Ambulatory Visit | Attending: Vascular Surgery

## 2022-05-17 ENCOUNTER — Encounter: Payer: Self-pay | Admitting: Vascular Surgery

## 2022-05-17 HISTORY — PX: LOWER EXTREMITY INTERVENTION: CATH118252

## 2022-05-17 LAB — CBC
HCT: 34 % — ABNORMAL LOW (ref 39.0–52.0)
HCT: 30.6 % — ABNORMAL LOW (ref 39.0–52.0)
HCT: 27.7 % — ABNORMAL LOW (ref 39.0–52.0)
Hemoglobin: 11.3 g/dL — ABNORMAL LOW (ref 13.0–17.0)
Hemoglobin: 10.1 g/dL — ABNORMAL LOW (ref 13.0–17.0)
Hemoglobin: 9.2 g/dL — ABNORMAL LOW (ref 13.0–17.0)
MCH: 29.3 pg (ref 26.0–34.0)
MCH: 29.1 pg (ref 26.0–34.0)
MCH: 29.2 pg (ref 26.0–34.0)
MCHC: 33.2 g/dL (ref 30.0–36.0)
MCHC: 33 g/dL (ref 30.0–36.0)
MCHC: 33.2 g/dL (ref 30.0–36.0)
MCV: 88.1 fL (ref 80.0–100.0)
MCV: 88.2 fL (ref 80.0–100.0)
MCV: 87.9 fL (ref 80.0–100.0)
Platelets: 178 10*3/uL (ref 150–400)
Platelets: 158 10*3/uL (ref 150–400)
Platelets: 150 10*3/uL (ref 150–400)
RBC: 3.86 MIL/uL — ABNORMAL LOW (ref 4.22–5.81)
RBC: 3.47 MIL/uL — ABNORMAL LOW (ref 4.22–5.81)
RBC: 3.15 MIL/uL — ABNORMAL LOW (ref 4.22–5.81)
RDW: 13.3 % (ref 11.5–15.5)
RDW: 13.5 % (ref 11.5–15.5)
RDW: 13.3 % (ref 11.5–15.5)
WBC: 14.6 10*3/uL — ABNORMAL HIGH (ref 4.0–10.5)
WBC: 10.3 10*3/uL (ref 4.0–10.5)
WBC: 11.3 10*3/uL — ABNORMAL HIGH (ref 4.0–10.5)
nRBC: 0 % (ref 0.0–0.2)
nRBC: 0 % (ref 0.0–0.2)
nRBC: 0 % (ref 0.0–0.2)

## 2022-05-17 LAB — RENAL FUNCTION PANEL
Albumin: 3.4 g/dL — ABNORMAL LOW (ref 3.5–5.0)
Anion gap: 7 (ref 5–15)
BUN: 19 mg/dL (ref 6–20)
CO2: 27 mmol/L (ref 22–32)
Calcium: 8.5 mg/dL — ABNORMAL LOW (ref 8.9–10.3)
Chloride: 105 mmol/L (ref 98–111)
Creatinine, Ser: 0.85 mg/dL (ref 0.61–1.24)
GFR, Estimated: >60 mL/min (ref >60)
Glucose, Bld: 128 mg/dL — ABNORMAL HIGH (ref 70–99)
Phosphorus: 4.3 mg/dL (ref 2.5–4.6)
Potassium: 4.2 mmol/L (ref 3.5–5.1)
Sodium: 139 mmol/L (ref 135–145)

## 2022-05-17 LAB — HEPARIN LEVEL (UNFRACTIONATED)
Heparin Unfractionated: 0.62 IU/mL (ref 0.30–0.70)
Heparin Unfractionated: 0.92 IU/mL — ABNORMAL HIGH (ref 0.30–0.70)

## 2022-05-17 LAB — FIBRINOGEN
Fibrinogen: 139 mg/dL — ABNORMAL LOW (ref 210–475)
Fibrinogen: 154 mg/dL — ABNORMAL LOW (ref 210–475)
Fibrinogen: 199 mg/dL — ABNORMAL LOW (ref 210–475)

## 2022-05-17 LAB — HEMOGLOBIN AND HEMATOCRIT, BLOOD
HCT: 27.5 % — ABNORMAL LOW (ref 39.0–52.0)
Hemoglobin: 9 g/dL — ABNORMAL LOW (ref 13.0–17.0)

## 2022-05-17 LAB — MAGNESIUM: Magnesium: 1.9 mg/dL (ref 1.7–2.4)

## 2022-05-17 SURGERY — LOWER EXTREMITY INTERVENTION
Anesthesia: Moderate Sedation | Laterality: Bilateral

## 2022-05-17 SURGERY — CREATION, BYPASS, ARTERIAL, FEMORAL TO FEMORAL, USING GRAFT
Anesthesia: General

## 2022-05-17 MED ORDER — FAMOTIDINE 20 MG PO TABS
ORAL_TABLET | ORAL | Status: AC
  Filled 2022-05-17: qty 2

## 2022-05-17 MED ORDER — POLYETHYLENE GLYCOL 3350 17 G PO PACK
17.0000 g | PACK | Freq: Every day | ORAL | Status: DC
  Administered 2022-05-20 – 2022-05-21 (×2): 17 g via ORAL
  Filled 2022-05-17 (×3): qty 1

## 2022-05-17 MED ORDER — HYDROMORPHONE HCL 1 MG/ML IJ SOLN
INTRAMUSCULAR | Status: AC
  Administered 2022-05-17: 1 mg
  Filled 2022-05-17: qty 1

## 2022-05-17 MED ORDER — HYDRALAZINE HCL 20 MG/ML IJ SOLN: 5.0000 mg | INTRAMUSCULAR | Status: DC | PRN

## 2022-05-17 MED ORDER — HEPARIN SODIUM (PORCINE) 1000 UNIT/ML IJ SOLN
INTRAMUSCULAR | Status: DC | PRN
  Administered 2022-05-17: 6000 [IU] via INTRAVENOUS

## 2022-05-17 MED ORDER — OXYCODONE HCL 5 MG PO TABS
5.0000 mg | ORAL_TABLET | ORAL | Status: DC | PRN
  Administered 2022-05-17 – 2022-05-21 (×8): 10 mg via ORAL
  Filled 2022-05-17 (×8): qty 2

## 2022-05-17 MED ORDER — HEPARIN SODIUM (PORCINE) 1000 UNIT/ML IJ SOLN
INTRAMUSCULAR | Status: AC
  Filled 2022-05-17: qty 10

## 2022-05-17 MED ORDER — DIPHENHYDRAMINE HCL 50 MG/ML IJ SOLN: 50.0000 mg | Freq: Once | INTRAMUSCULAR | Status: AC | PRN

## 2022-05-17 MED ORDER — METHYLPREDNISOLONE SODIUM SUCC 125 MG IJ SOLR
INTRAMUSCULAR | Status: AC
  Filled 2022-05-17: qty 2

## 2022-05-17 MED ORDER — SODIUM CHLORIDE 0.9 % IV SOLN: 250.0000 mL | INTRAVENOUS | Status: DC | PRN

## 2022-05-17 MED ORDER — DIPHENHYDRAMINE HCL 50 MG/ML IJ SOLN
25.0000 mg | Freq: Four times a day (QID) | INTRAMUSCULAR | Status: DC | PRN
  Administered 2022-05-17: 25 mg via INTRAVENOUS
  Filled 2022-05-17 (×2): qty 1

## 2022-05-17 MED ORDER — METHYLPREDNISOLONE SODIUM SUCC 125 MG IJ SOLR
125.0000 mg | Freq: Once | INTRAMUSCULAR | Status: AC | PRN
  Administered 2022-05-17: 125 mg via INTRAVENOUS

## 2022-05-17 MED ORDER — ONDANSETRON HCL 4 MG/2ML IJ SOLN: 4.0000 mg | Freq: Four times a day (QID) | INTRAMUSCULAR | Status: DC | PRN

## 2022-05-17 MED ORDER — MIDAZOLAM HCL 2 MG/2ML IJ SOLN
INTRAMUSCULAR | Status: DC | PRN
  Administered 2022-05-17: 2 mg via INTRAVENOUS
  Administered 2022-05-17 (×3): .5 mg via INTRAVENOUS

## 2022-05-17 MED ORDER — SODIUM CHLORIDE 0.9% FLUSH: 3.0000 mL | INTRAVENOUS | Status: DC | PRN

## 2022-05-17 MED ORDER — DOCUSATE SODIUM 100 MG PO CAPS
100.0000 mg | ORAL_CAPSULE | Freq: Two times a day (BID) | ORAL | Status: DC
  Administered 2022-05-19 – 2022-05-21 (×4): 100 mg via ORAL
  Filled 2022-05-17 (×5): qty 1

## 2022-05-17 MED ORDER — MIDAZOLAM HCL 5 MG/5ML IJ SOLN
INTRAMUSCULAR | Status: AC
  Filled 2022-05-17: qty 5

## 2022-05-17 MED ORDER — FENTANYL CITRATE PF 50 MCG/ML IJ SOSY
PREFILLED_SYRINGE | INTRAMUSCULAR | Status: AC
  Filled 2022-05-17: qty 1

## 2022-05-17 MED ORDER — ACETAMINOPHEN 325 MG PO TABS: 650.0000 mg | ORAL_TABLET | ORAL | Status: DC | PRN

## 2022-05-17 MED ORDER — SODIUM CHLORIDE 0.9% FLUSH: 3.0000 mL | Freq: Two times a day (BID) | INTRAVENOUS | Status: DC

## 2022-05-17 MED ORDER — SODIUM CHLORIDE 0.9 % IV SOLN: INTRAVENOUS | Status: DC

## 2022-05-17 MED ORDER — CEFAZOLIN SODIUM-DEXTROSE 2-4 GM/100ML-% IV SOLN: 2.0000 g | INTRAVENOUS | Status: DC

## 2022-05-17 MED ORDER — CEFAZOLIN SODIUM-DEXTROSE 1-4 GM/50ML-% IV SOLN
INTRAVENOUS | Status: DC | PRN
  Administered 2022-05-17: 2 g via INTRAVENOUS

## 2022-05-17 MED ORDER — HYDROMORPHONE HCL 1 MG/ML IJ SOLN
1.0000 mg | Freq: Once | INTRAMUSCULAR | Status: AC | PRN
  Administered 2022-05-17: 1 mg via INTRAVENOUS
  Filled 2022-05-17: qty 1

## 2022-05-17 MED ORDER — HEPARIN (PORCINE) 25000 UT/250ML-% IV SOLN
950.0000 [IU]/h | INTRAVENOUS | Status: DC
  Administered 2022-05-17: 1100 [IU]/h via INTRAVENOUS
  Filled 2022-05-17: qty 250

## 2022-05-17 MED ORDER — DIPHENHYDRAMINE HCL 50 MG/ML IJ SOLN
INTRAMUSCULAR | Status: AC
  Filled 2022-05-17: qty 1

## 2022-05-17 MED ORDER — HYDROMORPHONE HCL 1 MG/ML IJ SOLN
INTRAMUSCULAR | Status: DC | PRN
  Administered 2022-05-17: .5 mg via INTRAVENOUS

## 2022-05-17 MED ORDER — DIPHENHYDRAMINE HCL 25 MG PO CAPS
25.0000 mg | ORAL_CAPSULE | Freq: Four times a day (QID) | ORAL | Status: DC | PRN
  Administered 2022-05-18 – 2022-05-19 (×2): 25 mg via ORAL
  Filled 2022-05-17 (×3): qty 1

## 2022-05-17 MED ORDER — ALPRAZOLAM 0.25 MG PO TABS
0.2500 mg | ORAL_TABLET | Freq: Two times a day (BID) | ORAL | Status: DC | PRN
  Administered 2022-05-17 – 2022-05-18 (×2): 0.25 mg via ORAL
  Filled 2022-05-17 (×2): qty 1

## 2022-05-17 MED ORDER — IODIXANOL 320 MG/ML IV SOLN
INTRAVENOUS | Status: DC | PRN
  Administered 2022-05-17: 120 mL via INTRA_ARTERIAL

## 2022-05-17 MED ORDER — CEFAZOLIN SODIUM-DEXTROSE 2-4 GM/100ML-% IV SOLN
INTRAVENOUS | Status: AC
  Filled 2022-05-17: qty 100

## 2022-05-17 MED ORDER — FENTANYL CITRATE (PF) 100 MCG/2ML IJ SOLN
INTRAMUSCULAR | Status: DC | PRN
  Administered 2022-05-17: 50 ug via INTRAVENOUS
  Administered 2022-05-17 (×3): 25 ug via INTRAVENOUS

## 2022-05-17 MED ORDER — FENTANYL CITRATE (PF) 100 MCG/2ML IJ SOLN
INTRAMUSCULAR | Status: AC
  Filled 2022-05-17: qty 2

## 2022-05-17 MED ORDER — HYDROMORPHONE HCL 1 MG/ML IJ SOLN
0.5000 mg | INTRAMUSCULAR | Status: DC | PRN
  Administered 2022-05-17: 1 mg via INTRAVENOUS

## 2022-05-17 MED ORDER — FAMOTIDINE 20 MG PO TABS
40.0000 mg | ORAL_TABLET | Freq: Once | ORAL | Status: AC | PRN
  Administered 2022-05-17: 40 mg via ORAL

## 2022-05-17 MED ORDER — LABETALOL HCL 5 MG/ML IV SOLN: 10.0000 mg | INTRAVENOUS | Status: DC | PRN

## 2022-05-17 MED ORDER — HYDROMORPHONE HCL 1 MG/ML IJ SOLN
0.5000 mg | INTRAMUSCULAR | Status: DC | PRN
  Administered 2022-05-17: 1 mg via INTRAVENOUS
  Administered 2022-05-18: 0.5 mg via INTRAVENOUS
  Filled 2022-05-17 (×3): qty 1

## 2022-05-17 MED ORDER — SODIUM CHLORIDE 0.9 % IV SOLN: INTRAVENOUS | Status: AC

## 2022-05-17 SURGICAL SUPPLY — 64 items
BAG COUNTER SPONGE SURGICOUNT (BAG) ×1 IMPLANT
BAG DECANTER FOR FLEXI CONT (MISCELLANEOUS) ×1 IMPLANT
BLADE SURG 15 STRL LF DISP TIS (BLADE) ×1 IMPLANT
BLADE SURG 15 STRL SS (BLADE) ×1
BLADE SURG SZ11 CARB STEEL (BLADE) ×1 IMPLANT
BOOT SUTURE VASCULAR YLW (MISCELLANEOUS) ×2
BRUSH SCRUB EZ  4% CHG (MISCELLANEOUS) ×1
BRUSH SCRUB EZ 4% CHG (MISCELLANEOUS) ×1 IMPLANT
CLAMP SUTURE YELLOW 5 PAIRS (MISCELLANEOUS) ×2 IMPLANT
DERMABOND ADVANCED .7 DNX12 (GAUZE/BANDAGES/DRESSINGS) ×1 IMPLANT
DRAPE INCISE IOBAN 66X45 STRL (DRAPES) ×1 IMPLANT
DRESSING SURGICEL FIBRLLR 1X2 (HEMOSTASIS) ×1 IMPLANT
DRSG SURGICEL FIBRILLAR 1X2 (HEMOSTASIS) ×1
DURAPREP 26ML APPLICATOR (WOUND CARE) ×1 IMPLANT
ELECT CAUTERY BLADE 6.4 (BLADE) ×2 IMPLANT
ELECT REM PT RETURN 9FT ADLT (ELECTROSURGICAL) ×1
ELECTRODE REM PT RTRN 9FT ADLT (ELECTROSURGICAL) ×1 IMPLANT
GAUZE 4X4 16PLY ~~LOC~~+RFID DBL (SPONGE) ×1 IMPLANT
GEL ULTRASOUND 20GR AQUASONIC (MISCELLANEOUS) IMPLANT
GLOVE BIO SURGEON STRL SZ7 (GLOVE) ×1 IMPLANT
GLOVE SURG SYN 7.0 (GLOVE) ×2 IMPLANT
GLOVE SURG SYN 8.0 (GLOVE) ×1 IMPLANT
GOWN STRL REUS W/ TWL LRG LVL3 (GOWN DISPOSABLE) ×2 IMPLANT
GOWN STRL REUS W/ TWL XL LVL3 (GOWN DISPOSABLE) ×2 IMPLANT
GOWN STRL REUS W/TWL LRG LVL3 (GOWN DISPOSABLE) ×2
GOWN STRL REUS W/TWL XL LVL3 (GOWN DISPOSABLE) ×2
IV NS 500ML (IV SOLUTION) ×1
IV NS 500ML BAXH (IV SOLUTION) ×1 IMPLANT
KIT TURNOVER KIT A (KITS) ×1 IMPLANT
LABEL OR SOLS (LABEL) ×1 IMPLANT
LOOP VESSEL MAXI  1X406 RED (MISCELLANEOUS) ×4
LOOP VESSEL MAXI 1X406 RED (MISCELLANEOUS) ×4 IMPLANT
LOOP VESSEL MINI 0.8X406 BLUE (MISCELLANEOUS) ×2 IMPLANT
MANIFOLD NEPTUNE II (INSTRUMENTS) ×1 IMPLANT
NEEDLE FILTER BLUNT 18X1 1/2 (NEEDLE) ×1 IMPLANT
NS IRRIG 500ML POUR BTL (IV SOLUTION) ×1 IMPLANT
PACK BASIN MAJOR ARMC (MISCELLANEOUS) ×1 IMPLANT
PACK UNIVERSAL (MISCELLANEOUS) ×1 IMPLANT
SET WALTER ACTIVATION W/DRAPE (SET/KITS/TRAYS/PACK) ×2 IMPLANT
SPONGE T-LAP 18X18 ~~LOC~~+RFID (SPONGE) ×2 IMPLANT
SUT GTX CV-5 TTC13 DBL (SUTURE) IMPLANT
SUT MNCRL+ 5-0 UNDYED PC-3 (SUTURE) ×2 IMPLANT
SUT MONOCRYL 5-0 (SUTURE) ×2
SUT PROLENE 5 0 RB 1 DA (SUTURE) ×4 IMPLANT
SUT PROLENE 6 0 BV (SUTURE) ×6 IMPLANT
SUT SILK 2 0 (SUTURE) ×1
SUT SILK 2 0 SH (SUTURE) ×1 IMPLANT
SUT SILK 2-0 18XBRD TIE 12 (SUTURE) ×1 IMPLANT
SUT SILK 3 0 (SUTURE) ×1
SUT SILK 3-0 18XBRD TIE 12 (SUTURE) ×1 IMPLANT
SUT SILK 4 0 (SUTURE) ×1
SUT SILK 4-0 18XBRD TIE 12 (SUTURE) ×1 IMPLANT
SUT VIC AB 2-0 CT1 (SUTURE) ×2 IMPLANT
SUT VIC AB 3-0 SH 27 (SUTURE) ×2
SUT VIC AB 3-0 SH 27X BRD (SUTURE) ×2 IMPLANT
SUT VICRYL+ 3-0 36IN CT-1 (SUTURE) ×2 IMPLANT
SYR 20ML LL LF (SYRINGE) ×1 IMPLANT
SYR 3ML LL SCALE MARK (SYRINGE) ×1 IMPLANT
SYR BULB IRRIG 60ML STRL (SYRINGE) ×1 IMPLANT
TAG SUTURE CLAMP YLW 5PR (MISCELLANEOUS) ×2
TAPE UMBILICAL 1/8 X36 TWILL (MISCELLANEOUS) ×1 IMPLANT
TRAP FLUID SMOKE EVACUATOR (MISCELLANEOUS) ×1 IMPLANT
TRAY FOLEY MTR SLVR 16FR STAT (SET/KITS/TRAYS/PACK) ×1 IMPLANT
WATER STERILE IRR 500ML POUR (IV SOLUTION) ×1 IMPLANT

## 2022-05-17 SURGICAL SUPPLY — 28 items
APL PRP STRL LF DISP 70% ISPRP (MISCELLANEOUS) ×3
BALLN LUTONIX AV 7X60X75 (BALLOONS) ×1
BALLN ULTRVRSE 7X60X75C (BALLOONS) ×1
BALLN ULTRVRSE 8X100X75 (BALLOONS) ×1
BALLOON LUTONIX AV 7X60X75 (BALLOONS) IMPLANT
BALLOON ULTRVRSE 7X60X75C (BALLOONS) IMPLANT
BALLOON ULTRVRSE 8X100X75 (BALLOONS) IMPLANT
CANISTER PENUMBRA ENGINE (MISCELLANEOUS) IMPLANT
CATH ANGIO 5F PIGTAIL 65CM (CATHETERS) IMPLANT
CATH BEACON 5 .035 65 KMP TIP (CATHETERS) IMPLANT
CATH LIGHTNING BOLT 7 130 (CATHETERS) IMPLANT
CHLORAPREP W/TINT 26 (MISCELLANEOUS) IMPLANT
DEVICE SAFEGUARD 24CM (GAUZE/BANDAGES/DRESSINGS) IMPLANT
DEVICE STARCLOSE SE CLOSURE (Vascular Products) IMPLANT
GLIDEWIRE ADV .035X260CM (WIRE) IMPLANT
KIT ENCORE 26 ADVANTAGE (KITS) IMPLANT
PACK ANGIOGRAPHY (CUSTOM PROCEDURE TRAY) IMPLANT
SHEATH BRITE TIP 7FRX11 (SHEATH) IMPLANT
STENT LIFESTAR 9X80 (Permanent Stent) IMPLANT
STENT LIFESTREAM 6X58X80 (Permanent Stent) IMPLANT
STENT LIFESTREAM 7X58X80 (Permanent Stent) IMPLANT
STENT LIFESTREAM 8X26X80 (Permanent Stent) IMPLANT
STENT LIFESTREAM 8X37X80 (Permanent Stent) IMPLANT
STENT LIFESTREAM 8X58X80 (Permanent Stent) IMPLANT
SYR MEDRAD MARK 7 150ML (SYRINGE) IMPLANT
TOWEL GREEN STERILE FF (TOWEL DISPOSABLE) IMPLANT
TUBING CONTRAST HIGH PRESS 72 (TUBING) IMPLANT
WIRE RUNTHROUGH .014X300CM (WIRE) IMPLANT

## 2022-05-17 NOTE — Progress Notes (Signed)
ANTICOAGULATION CONSULT NOTE - Initial Consult  Pharmacy Consult for Heparin Indication:  atherosclerotic disease bilateral lower extremities (S/P alteplase)   Allergies  Allergen Reactions   Versed [Midazolam] Rash    raised  red whelps and burning right iv site after recieved fentanyl and versed.  Second procedure pt recieved  IV solumedrol IV benedryl and pepcid po and tolerated both.    Fentanyl Hives    raised red whelps and burning right iv site after recieved fentanyl and versed Second procedure pt recieved  IV solumedrol IV benedryl and pepcid po and tolerated both.      Patient Measurements: Height: '5\' 5"'$  (165.1 cm) Weight: 79.8 kg (175 lb 14.8 oz) IBW/kg (Calculated) : 61.5 Heparin Dosing Weight: 77.8 kg   Vital Signs: Temp: 98.7 F (37.1 C) (03/14 1600) Temp Source: Oral (03/14 0800) BP: 118/77 (03/14 1600) Pulse Rate: 89 (03/14 1600)  Labs: Recent Labs    05/16/22 0102 05/16/22 0620 05/16/22 1400 05/16/22 2225 05/17/22 0407 05/17/22 0630 05/17/22 1129 05/17/22 1828  HGB 13.7 13.2 12.8*   < > 11.3*  --  10.1* 9.2*  HCT 40.1 39.2 38.2*   < > 34.0*  --  30.6* 27.7*  PLT 234 212 180   < > 178  --  158 150  HEPARINUNFRC <0.10* 0.20* 0.50  --   --  0.62  --  0.92*  CREATININE 0.78 0.72  --   --  0.85  --   --   --    < > = values in this interval not displayed.     Estimated Creatinine Clearance: 93.3 mL/min (by C-G formula based on SCr of 0.85 mg/dL).   Medical History: Past Medical History:  Diagnosis Date   Carpal tunnel syndrome of left wrist    DJD (degenerative joint disease)    History of kidney stones    Hyperlipidemia    Leg pain    Low back pain 04/04/2017   PAD (peripheral artery disease) (HCC)    Peripheral vascular disease (HCC)    Right inguinal hernia     Medications:  Medications Prior to Admission  Medication Sig Dispense Refill Last Dose   acetaminophen (TYLENOL) 325 MG tablet Take 650 mg by mouth daily as needed for  moderate pain.   prn at unk   aspirin EC 81 MG tablet Take 81 mg by mouth daily. Swallow whole.   05/15/2022   HYDROcodone-acetaminophen (NORCO/VICODIN) 5-325 MG tablet Take 1 tablet by mouth every 6 (six) hours as needed for severe pain. Must last 30 days. 120 tablet 0 05/15/2022   ibuprofen (ADVIL) 800 MG tablet Take 800 mg by mouth every 8 (eight) hours as needed for moderate pain.   prn at unk   propranolol (INDERAL) 10 MG tablet Take 10 mg by mouth 3 (three) times daily.   05/15/2022   rosuvastatin (CRESTOR) 20 MG tablet Take 20 mg by mouth daily.   05/15/2022    Assessment: Pharmacy consulted to dose heparin in this 57 year old male admitted to vascular surgery for TPA thrombolysis of bilateral lower extremities due to atherosclerotic occlusion and possible aortobifemoral bypass.  Pt was started on alteplase on 3/13 @ 1004 at 0.5 mg/hr , rate decreased to 0.25 mg/hr at 1813.  Heparin gtt started on 3/13 @ 400 units/hr to run continuously   Pt developed hematoma around 2100.  MD would like to d/c alteplase and increase heparin infusion to achieve goal HL of 0.3 - 0.7 but NO BOLUS.  Heparin infusion held until 3/14 @ 0000 per MD request and then stopped again this AM for angioplasty.  03/14:  HL @ 0630 = 0.62, therapeutic X 1  03/14:  Heparin stopped @ around 0700 03/14:  Heparin resumed @ 1130 at previous rate of 1250 un/hr (no initial bolus) 3/14 1828 HL 0.92, Supratherapeutic   Goal of Therapy:  Heparin level 0.3-0.7 units/ml Monitor platelets by anticoagulation protocol: Yes Fibrinogen >/= 100 (per MD recommended value)    Plan:  Heparin supratherapeutic Reduce heparin infusion to 1100 un/hr  Check HL in 6 hrs following rate change CBC daily while on heparin  Dorothe Pea, PharmD, BCPS Clinical Pharmacist   05/17/2022 6:49 PM

## 2022-05-17 NOTE — Op Note (Signed)
[facility]  Percutaneous Study/Intervention Procedural Note   Date of Surgery: 05/17/2022,10:36 AM  Surgeon:Vannary Greening, Dolores Lory   Pre-operative Diagnosis: Atherosclerotic occlusive disease bilateral lower extremities with rest pain; complication vascular device with thrombosis of aortobiiliac bypass graft   Post-operative diagnosis:  Same  Procedure(s) Performed:  1.  Abdominal aortogram with bilateral lower extremity angiography  2.  Angioplasty and stent placement into the aorta bilateral femoral approach  3.  Angioplasty and stent placement bilateral iliac limbs of the aortobifemoral bypass graft  4.  Angioplasty and stent placement left external iliac artery  5.  Mechanical thrombectomy of the right external iliac artery and right limb of the aortobifemoral bypass graft  6.  StarClose bilateral common femoral artery sheaths    Anesthesia: Conscious sedation was administered by the interventional radiology RN under my direct supervision. IV Versed plus fentanyl were utilized. Continuous ECG, pulse oximetry and blood pressure was monitored throughout the entire procedure. Conscious sedation was administered for a total of 120 minutes.  Sheath: Existing 6 [place] common femoral sheaths bilaterally are upsized to 7 French 11 cm Pinnacle sheath both retrograde  Contrast: 115 cc   Fluoroscopy Time: 12.4 minutes  Indications: Patient is returning to the angio suite for follow-up status post approximately 48 hours of lysis therapy.  Procedure:  [name] a 57 y.o. male who was identified and appropriate procedural time out was performed.  The patient was then placed supine on the table and prepped and draped in the usual sterile fashion including both groins as well as both the existing sheaths and both infusion catheters.    The infusion wire on the right is removed and there is clot associated with the wire.  The advantage wire was then advanced  through the infusion catheter and exchanged for a pigtail catheter.  Pigtail catheter was positioned just above the renal arteries and AP projection of the aorta is obtained.  This demonstrates complete occlusion of the right limb of the aortobifemoral with persistent thrombus in the body of the graft.  There is marked irregularity of the left limb.  Both renal arteries are open.  The actual anastomoses both appear patent however there seems to be some thrombus still associated with the left.  Based on these findings I upsized the sheath on the right to a 7 French 11 cm Pinnacle sheath and the penumbra CAT 7 lightning catheter was used to perform thrombectomy of the right limb of the  aortobiiliac graft.  After 3 passes were made a moderate amount of thrombotic material was retained but there was still marked abnormal flow on the right side.  Based on the irregularity of both the right and left limbs of the bypass graft I felt the optimal treatment would be to stent the graft proximally beginning just below the renal arteries and then extending down to the anastomoses.  Initially I selected two 6 mm x 58 mm Lifestream stents.  I upsized the sheath on the left to a 7 [place] sheath.  These 2 stents were then brought up so that the flow divider of the aortobiiliac graft was at the midportion of these 2 stents.  They were then deployed simultaneously.  Next 7 mm balloons were advanced up both sides and used to post dilate the stents.  At this point I selected two 7 mm x 58 mm Lifestream stent and an 8 mm x 58 mm Lifestream stent.  A magnified image of the renal arteries was then obtained  and the Lifestream stents were brought up so that the leading edges were approximately 2 mm below the left renal artery which was the lowest renal artery.  The 8 mm stent was on the left and the 7 mm stent was on the right.  They were then deployed simultaneously.  An 8 mm x 100 mm Ultraverse balloon was then opened onto the field and  advanced up the right side and simultaneous inflation was performed expanding all 4 of the stents to 8 mm in diameter.  At this point an LAO projection of the the right side was obtained by hand-injection through the sheath.  This demonstrated that the existing stents came to within a centimeter of the actual anastomosis and the anastomosis was widely patent as was the last uncovered portion of the graft with no evidence of retained thrombus or stenosis.  I was satisfied with this and turned my attention to the left side where an RAO projection was obtained.  This demonstrated persistent thrombus within the distal limb of the graft as well as some thrombus which was nonocclusive in the internal.  There is also thrombus extending into the external iliac artery by 3 or 4 cm.  This was nonocclusive as well.  Based on these findings I felt that covering the graft with further lifestream stents was necessary.  A 8 mm x 36 mm Lifestream and a 8 mm x 26 mm Lifestream were utilized.  2 stents were utilized as these were the stents that were available rather than using one 8 mm x 58 mm stent.  Next to treat the thrombus within the external iliac on the left a 9 mm x 80 mm life star stent was deployed.  The stent was not postdilated as follow-up imaging demonstrated it fully expanded opposing the wall and caged the thrombus without needing further treatment.  At this point pigtail catheter was returned on the right side.  Serial injections were then performed beginning at the level of the renal arteries down to the proximal SFAs.  No further thrombus or flow-limiting lesions were identified inline flow was now achieved bilaterally and therefore I elected to terminate the case.  Oblique views of first the left groin and the right groin were obtained and StarClose devices were deployed.  Safeguards were then applied.  Findings:   Please see the findings as described above essentially there was significant thrombus  still retained in the graft itself as well as the left external iliac artery.  The aorta iliac system is now been successfully reconstructed using stents beginning just below the level of the renals and extending down to the iliac bifurcations bilaterally with extension into the external iliac artery on the left.   Disposition: Patient was taken to the recovery room in stable condition having tolerated the procedure well.  [name] 05/17/2022,10:36 AM

## 2022-05-17 NOTE — Progress Notes (Signed)
ANTICOAGULATION CONSULT NOTE - Initial Consult  Pharmacy Consult for Heparin Indication:  atherosclerotic disease bilateral lower extremities (S/P alteplase)   Allergies  Allergen Reactions   Versed [Midazolam] Rash    raised  red whelps and burning right iv site after recieved fentanyl and versed.  Second procedure pt recieved  IV solumedrol IV benedryl and pepcid po and tolerated both.    Fentanyl Hives    raised red whelps and burning right iv site after recieved fentanyl and versed Second procedure pt recieved  IV solumedrol IV benedryl and pepcid po and tolerated both.      Patient Measurements: Height: '5\' 5"'$  (165.1 cm) Weight: 79.8 kg (176 lb) IBW/kg (Calculated) : 61.5 Heparin Dosing Weight: 77.8 kg   Vital Signs: Temp: 98.1 F (36.7 C) (03/14 0400) Temp Source: Oral (03/14 0400) BP: 108/74 (03/14 0600) Pulse Rate: 83 (03/14 0600)  Labs: Recent Labs    05/16/22 0102 05/16/22 0620 05/16/22 1400 05/16/22 2225 05/17/22 0407 05/17/22 0630  HGB 13.7 13.2 12.8* 12.0* 11.3*  --   HCT 40.1 39.2 38.2* 35.4* 34.0*  --   PLT 234 212 180 201 178  --   HEPARINUNFRC <0.10* 0.20* 0.50  --   --  0.62  CREATININE 0.78 0.72  --   --  0.85  --      Estimated Creatinine Clearance: 93.3 mL/min (by C-G formula based on SCr of 0.85 mg/dL).   Medical History: Past Medical History:  Diagnosis Date   Carpal tunnel syndrome of left wrist    DJD (degenerative joint disease)    History of kidney stones    Hyperlipidemia    Leg pain    Low back pain 04/04/2017   PAD (peripheral artery disease) (HCC)    Peripheral vascular disease (HCC)    Right inguinal hernia     Medications:  Medications Prior to Admission  Medication Sig Dispense Refill Last Dose   acetaminophen (TYLENOL) 325 MG tablet Take 650 mg by mouth daily as needed for moderate pain.   prn at unk   aspirin EC 81 MG tablet Take 81 mg by mouth daily. Swallow whole.   05/15/2022   HYDROcodone-acetaminophen  (NORCO/VICODIN) 5-325 MG tablet Take 1 tablet by mouth every 6 (six) hours as needed for severe pain. Must last 30 days. 120 tablet 0 05/15/2022   ibuprofen (ADVIL) 800 MG tablet Take 800 mg by mouth every 8 (eight) hours as needed for moderate pain.   prn at unk   propranolol (INDERAL) 10 MG tablet Take 10 mg by mouth 3 (three) times daily.   05/15/2022   rosuvastatin (CRESTOR) 20 MG tablet Take 20 mg by mouth daily.   05/15/2022    Assessment: Pharmacy consulted to dose heparin in this 57 year old male admitted to vascular surgery for TPA thrombolysis of bilateral lower extremities due to atherosclerotic occlusion and possible aortobifemoral bypass.  Pt was started on alteplase on 3/13 @ 1004 at 0.5 mg/hr , rate decreased to 0.25 mg/hr at 1813.  Heparin gtt started on 3/13 @ 400 units/hr to run continuously   Pt developed hematoma around 2100.  MD would like to d/c alteplase and increase heparin infusion to achieve goal HL of 0.3 - 0.7 but NO BOLUS.  Heparin infusion held until 3/14 @ 0000 per MD request.   03/14:  HL @ 0630 = 0.62, therapeutic X 1   Goal of Therapy:  Heparin level 0.3-0.7 units/ml Monitor platelets by anticoagulation protocol: Yes Fibrinogen >/= 100 (per  MD recommended value)    Plan:  TPA d/c'd on 3/13 @ 2200. Will hold heparin infusion until 3/14 @ 0000 per MD request. Will begin heparin gtt @ 0000 at 1250 units/hr.  - NO BOLUS Will check HL Q6H for first 24 hrs and follow nomogram thereafter.  Will check fibrinogen Q6H and notify MD if fibrinogen is less than 100.   3/14:  HL @ 0630 = 0.62, therapeutic X 1 Will continue pt on current rate and recheck HL on 3/14 @ 1230.   Teofila Bowery D 05/17/2022,7:01 AM

## 2022-05-17 NOTE — Progress Notes (Signed)
[name] is a 57 year old male who is status postop day 0 from an abdominal aortogram with bilateral lower extremity angiography, angioplasty and stent placement.  He also underwent a mechanical thrombectomy of the right external iliac artery through the right limb of the aortobifemoral bypass graft.  Patient is resting comfortably in ICU in bed this afternoon.  Both his groins were accessed.  Both have PAD assistive devices in place.  According to nursing when devices have been relieve the pressure there is no bleeding no hematoma seroma to note.  On inflation there is also no noted bleeding hematoma or seroma.  Bilateral extremities have strong Doppler pulses both posttibial and dorsalis pedis.  Bilateral lower extremities remain warm to touch.  His vitals are remained stable.  Patient has no other complaints.

## 2022-05-17 NOTE — Progress Notes (Signed)
NAME:  [name], MRN:  [id number], DOB:  06-04-1965, LOS: 2 ADMISSION DATE:  05/15/2022, CONSULTATION DATE: 05/15/22 REFERRING MD: Dr. Delana Meyer, CHIEF COMPLAINT:  Bilateral Lower Extremity Rest Pain    History of Present Illness:  57 yo male with severe atherosclerotic changes of bilateral lower extremities with rest pain with associated with pre ulcerative changes and impending tissue loss of both lower extremities.  He presented to [facility] on 03/12 for a bilateral angio with thrombolysis and bilateral peripheral vascular thrombectomy.  Postop pt admitted to ICU for thrombolytic infusion and heparin gtt overnight in preparation for repeat arteriogram on 03/13.  PCCM team consulted to assist with management.    Pertinent  Medical History  DJD Kidney Stones  HLD Lower Back Pain  PAD  PVD  Right Inguinal Hernia  Chronic Pain Syndrome   Significant Hospital Events: Including procedures, antibiotic start and stop dates in addition to other pertinent events   03/12: Pt admitted to ICU s/p bilateral angio with thrombolysis and bilateral peripheral vascular thrombectomy  05/17/22- Patient reports improved pain at left groin hematoma.  He is on room air non labored breathing.  Patient is eating but reports low appetite. He has been making adequate urine but has not had BM in 5 days. Mild decrement in h/h but not worrisome for transfusion need.  Overall he appears stable now.   Interim History / Subjective:  Pt resting comfortably states his pain has improved following administration of pain medication prior to arrival to ICU   Objective   Blood pressure 118/77, pulse 89, temperature 98.7 F (37.1 C), resp. rate (!) 21, height '5\' 5"'$  (1.651 m), weight 79.8 kg, SpO2 96 %.        Intake/Output Summary (Last 24 hours) at 05/17/2022 1712 Last data filed at 05/17/2022 1600 Gross per 24 hour  Intake 2332.82 ml  Output 1445 ml  Net 887.82 ml   Filed Weights   05/15/22 1453 05/17/22 0800   Weight: 79.8 kg 79.8 kg    Examination: General:  NAD resting comfortably  HENT: Supple, no JVD  Lungs: Clear throughout, even, non labored  Cardiovascular: NSR, s1s2, rrr, no m/r/g, unable to palpate/doppler distal pulses  Abdomen: +BS x4, soft, obese, non tender, non distended  Extremities: Normal bulk, moves all extremities, bilateral feet cool to touch Skin: Bilateral groin vascular sites with left groin hematoma Neuro: Alert and oriented, follows commands, PERRLA  GU: Indwelling foley catheter draining yellow urine   Resolved Hospital Problem list     Assessment & Plan:  Atherosclerotic occlusive disease bilateral lower extremities with rest pain bilateral lower extremities; complication of vascular device with thrombosis aortobifemoral bypass graft s/p angiography with thrombolytic therapy  - Prn morphine for pain management  - Maintain map >65 - Continue heparin gtt and thrombolytic therapy overnight per vascular surgery  - Monitor for s/sx of bleeding  - Serial CBC and fibrinogen - Bleeding precautions  - Continue bedrest   Best Practice (right click and "Reselect all SmartList Selections" daily)   Diet/type: clear liquids DVT prophylaxis: systemic heparin GI prophylaxis: H2B Lines: Central line Foley:  Yes, and it is still needed Code Status:  full code Last date of multidisciplinary goals of care discussion [N/A]  Labs   CBC: Recent Labs  Lab 05/16/22 0620 05/16/22 1400 05/16/22 2225 05/17/22 0407 05/17/22 1129  WBC 8.6 10.5 15.0* 14.6* 10.3  HGB 13.2 12.8* 12.0* 11.3* 10.1*  HCT 39.2 38.2* 35.4* 34.0* 30.6*  MCV 86.2 85.8 86.3 88.1  88.2  PLT 212 180 201 178 0000000    Basic Metabolic Panel: Recent Labs  Lab 05/15/22 1423 05/15/22 1908 05/16/22 0102 05/16/22 0620 05/17/22 0407  NA  --  136 134* 136 139  K  --  3.8 3.8 4.1 4.2  CL  --  106 103 108 105  CO2  --  '24 24 24 27  '$ GLUCOSE  --  128* 170* 129* 128*  BUN '16 17 18 17 19  '$ CREATININE 0.68  0.58* 0.78 0.72 0.85  CALCIUM  --  9.0 8.7* 8.8* 8.5*  MG  --  2.1  --  2.0 1.9  PHOS  --  3.0  --   --  4.3    GFR: Estimated Creatinine Clearance: 93.3 mL/min (by C-G formula based on SCr of 0.85 mg/dL). Recent Labs  Lab 05/16/22 1400 05/16/22 2225 05/17/22 0407 05/17/22 1129  WBC 10.5 15.0* 14.6* 10.3    Liver Function Tests: Recent Labs  Lab 05/16/22 0102 05/17/22 0407  AST 39  --   ALT 35  --   ALKPHOS 62  --   BILITOT 1.9*  --   PROT 6.6  --   ALBUMIN 3.7 3.4*   No results for input(s): "LIPASE", "AMYLASE" in the last 168 hours. No results for input(s): "AMMONIA" in the last 168 hours.  ABG No results found for: "PHART", "PCO2ART", "PO2ART", "HCO3", "TCO2", "ACIDBASEDEF", "O2SAT"   Coagulation Profile: No results for input(s): "INR", "PROTIME" in the last 168 hours.  Cardiac Enzymes: No results for input(s): "CKTOTAL", "CKMB", "CKMBINDEX", "TROPONINI" in the last 168 hours.  HbA1C: No results found for: "HGBA1C"  CBG: Recent Labs  Lab 05/15/22 1829  GLUCAP 114*    Review of Systems:   Gen:  fever, chills, weight change, fatigue, night sweats HEENT: Denies blurred vision, double vision, hearing loss, tinnitus, sinus congestion, rhinorrhea, sore throat, neck stiffness, dysphagia PULM: Denies shortness of breath, cough, sputum production, hemoptysis, wheezing CV: Denies chest pain, edema, orthopnea, paroxysmal nocturnal dyspnea, palpitations GI: Denies abdominal pain, nausea, vomiting, diarrhea, hematochezia, melena, constipation, change in bowel habits GU: Denies dysuria, hematuria, polyuria, oliguria, urethral discharge Endocrine: Denies hot or cold intolerance, polyuria, polyphagia or appetite change Derm: Denies rash, dry skin, scaling or peeling skin change Heme: Denies easy bruising, bleeding, bleeding gums Neuro: headache, numbness, weakness, slurred speech, loss of memory or consciousness   Past Medical History:  He,  has a past medical  history of Carpal tunnel syndrome of left wrist, DJD (degenerative joint disease), History of kidney stones, Hyperlipidemia, Leg pain, Low back pain (04/04/2017), PAD (peripheral artery disease) ([place]), Peripheral vascular disease ([place]), and Right inguinal hernia.   Surgical History:   Past Surgical History:  Procedure Laterality Date   AORTA - BILATERAL FEMORAL ARTERY BYPASS GRAFT N/A 05/22/2017   Procedure: AORTA BIFEMORAL BYPASS GRAFT, Aortic bilateral, and internal ischemia;  Surgeon: Katha Cabal, MD;  Location: [facility] ORS;  Service: Vascular;  Laterality: N/A;   CENTRAL VENOUS CATHETER INSERTION Left 05/22/2017   Procedure: INSERTION CENTRAL LINE ADULT Left Internal Jugular;  Surgeon: Katha Cabal, MD;  Location: [facility] OR[facility];  Service: Vascular;  Laterality: Left;   COLONOSCOPY WITH PROPOFOL N/A 03/27/2017   Procedure: COLONOSCOPY WITH PROPOFOL;  Surgeon: Robert Bellow, MD;  Location: [facility];  Service: Endoscopy;  Laterality: N/A;   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   Procedure: ENDOSCOPIC LEFT CARPAL TUNNEL RELEASE AND 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.;  Surgeon: Corky Mull, MD;  Location: [facility] ORS;  Service: Orthopedics;  Laterality: Left;   INSERTION OF MESH Right 06/28/2021   Procedure: INSERTION OF MESH;  Surgeon: Ronny Bacon, MD;  Location: [facility] OR[facility];  Service: General;  Laterality: Right;   KIDNEY STONE SURGERY     KNEE SURGERY Left 2006   LOWER EXTREMITY INTERVENTION Bilateral 05/15/2022   Procedure: LOWER EXTREMITY INTERVENTION;  Surgeon: Katha Cabal, MD;  Location: [facility];  Service: Cardiovascular;  Laterality: Bilateral;   LOWER EXTREMITY INTERVENTION Bilateral 05/16/2022   Procedure: LOWER EXTREMITY INTERVENTION;  Surgeon: Katha Cabal, MD;  Location: [facility];  Service: Cardiovascular;  Laterality: Bilateral;   ROBOTIC ASSISTED LAPAROSCOPIC LYSIS OF ADHESION N/A 06/28/2021    Procedure: XI ROBOTIC ASSISTED LAPAROSCOPIC LYSIS OF ADHESION;  Surgeon: Ronny Bacon, MD;  Location: [facility];  Service: General;  Laterality: N/A;     Social History:   reports that he has been smoking cigarettes. He has a 7.50 pack-year smoking history. He has been exposed to tobacco smoke. He has never used smokeless tobacco. He reports that he does not drink alcohol and does not use drugs.   Family History:  His family history includes Alzheimer's disease in his father; Diabetes in his mother; Heart disease in his mother.   Allergies Allergies  Allergen Reactions   Versed [Midazolam] Rash    raised  red whelps and burning right iv site after recieved fentanyl and versed.  Second procedure pt recieved  IV solumedrol IV benedryl and pepcid po and tolerated both.    Fentanyl Hives    raised red whelps and burning right iv site after recieved fentanyl and versed Second procedure pt recieved  IV solumedrol IV benedryl and pepcid po and tolerated both.       Home Medications  Prior to Admission medications   Medication Sig Start Date End Date Taking? Authorizing Provider  aspirin EC 81 MG tablet Take 81 mg by mouth daily. Swallow whole.   Yes [provider]  HYDROcodone-acetaminophen (NORCO/VICODIN) 5-325 MG tablet Take 1 tablet by mouth every 6 (six) hours as needed for severe pain. Must last 30 days. 05/06/22 06/05/22 Yes Gillis Santa, MD  propranolol (INDERAL) 10 MG tablet Take 10 mg by mouth 3 (three) times daily. 08/03/21  Yes [provider]  rosuvastatin (CRESTOR) 20 MG tablet Take 20 mg by mouth daily. 08/12/21  Yes [provider]  acetaminophen (TYLENOL) 325 MG tablet Take 650 mg by mouth daily as needed for moderate pain.    [provider]  ibuprofen (ADVIL) 800 MG tablet Take 800 mg by mouth every 8 (eight) hours as needed for moderate pain.    [provider]      Ottie Glazier, M.D.  Pulmonary & Critical Care Medicine

## 2022-05-17 NOTE — Progress Notes (Signed)
ANTICOAGULATION CONSULT NOTE - Initial Consult  Pharmacy Consult for Heparin Indication:  atherosclerotic disease bilateral lower extremities (S/P alteplase)   Allergies  Allergen Reactions   Versed [Midazolam] Rash    raised  red whelps and burning right iv site after recieved fentanyl and versed.  Second procedure pt recieved  IV solumedrol IV benedryl and pepcid po and tolerated both.    Fentanyl Hives    raised red whelps and burning right iv site after recieved fentanyl and versed Second procedure pt recieved  IV solumedrol IV benedryl and pepcid po and tolerated both.      Patient Measurements: Height: '5\' 5"'$  (165.1 cm) Weight: 79.8 kg (175 lb 14.8 oz) IBW/kg (Calculated) : 61.5 Heparin Dosing Weight: 77.8 kg   Vital Signs: Temp: 98.1 F (36.7 C) (03/14 0800) Temp Source: Oral (03/14 0800) BP: 102/75 (03/14 1045) Pulse Rate: 77 (03/14 1045)  Labs: Recent Labs    05/16/22 0102 05/16/22 0620 05/16/22 1400 05/16/22 2225 05/17/22 0407 05/17/22 0630  HGB 13.7 13.2 12.8* 12.0* 11.3*  --   HCT 40.1 39.2 38.2* 35.4* 34.0*  --   PLT 234 212 180 201 178  --   HEPARINUNFRC <0.10* 0.20* 0.50  --   --  0.62  CREATININE 0.78 0.72  --   --  0.85  --      Estimated Creatinine Clearance: 93.3 mL/min (by C-G formula based on SCr of 0.85 mg/dL).   Medical History: Past Medical History:  Diagnosis Date   Carpal tunnel syndrome of left wrist    DJD (degenerative joint disease)    History of kidney stones    Hyperlipidemia    Leg pain    Low back pain 04/04/2017   PAD (peripheral artery disease) (HCC)    Peripheral vascular disease ([facility])    Right inguinal hernia     Medications:  Medications Prior to Admission  Medication Sig Dispense Refill Last Dose   acetaminophen (TYLENOL) 325 MG tablet Take 650 mg by mouth daily as needed for moderate pain.   prn at unk   aspirin EC 81 MG tablet Take 81 mg by mouth daily. Swallow whole.   05/15/2022   HYDROcodone-acetaminophen  (NORCO/VICODIN) 5-325 MG tablet Take 1 tablet by mouth every 6 (six) hours as needed for severe pain. Must last 30 days. 120 tablet 0 05/15/2022   ibuprofen (ADVIL) 800 MG tablet Take 800 mg by mouth every 8 (eight) hours as needed for moderate pain.   prn at unk   propranolol (INDERAL) 10 MG tablet Take 10 mg by mouth 3 (three) times daily.   05/15/2022   rosuvastatin (CRESTOR) 20 MG tablet Take 20 mg by mouth daily.   05/15/2022    Assessment: Pharmacy consulted to dose heparin in this 57 year old male admitted to vascular surgery for TPA thrombolysis of bilateral lower extremities due to atherosclerotic occlusion and possible aortobifemoral bypass.  Pt was started on alteplase on 3/13 @ 1004 at 0.5 mg/hr , rate decreased to 0.25 mg/hr at 1813.  Heparin gtt started on 3/13 @ 400 units/hr to run continuously   Pt developed hematoma around 2100.  MD would like to d/c alteplase and increase heparin infusion to achieve goal HL of 0.3 - 0.7 but NO BOLUS.  Heparin infusion held until 3/14 @ 0000 per MD request and then stopped again this AM for angioplasty.  03/14:  HL @ 0630 = 0.62, therapeutic X 1  03/14:  Heparin stopped @ around 0700 03/14:  Heparin  resumed @ 1130 at previous rate of 1250 un/hr (no initial bolus)  Goal of Therapy:  Heparin level 0.3-0.7 units/ml Monitor platelets by anticoagulation protocol: Yes Fibrinogen >/= 100 (per MD recommended value)    Plan:  Resume heparin at previous rate of 1250 un/hr on 3/14 @ 1130. No initial bolus. Check HL in 6 hrs CBC daily while on heparin  Will M. Ouida Sills, PharmD PGY-1 Pharmacy Resident 05/17/2022 11:23 AM

## 2022-05-18 ENCOUNTER — Encounter: Payer: Self-pay | Admitting: Vascular Surgery

## 2022-05-18 DIAGNOSIS — Z7901 Long term (current) use of anticoagulants: Secondary | ICD-10-CM

## 2022-05-18 LAB — CBC
HCT: 25 % — ABNORMAL LOW (ref 39.0–52.0)
HCT: 20.9 % — ABNORMAL LOW (ref 39.0–52.0)
HCT: 20.5 % — ABNORMAL LOW (ref 39.0–52.0)
Hemoglobin: 8.2 g/dL — ABNORMAL LOW (ref 13.0–17.0)
Hemoglobin: 7 g/dL — ABNORMAL LOW (ref 13.0–17.0)
Hemoglobin: 7 g/dL — ABNORMAL LOW (ref 13.0–17.0)
MCH: 29.5 pg (ref 26.0–34.0)
MCH: 29.5 pg (ref 26.0–34.0)
MCH: 30 pg (ref 26.0–34.0)
MCHC: 32.8 g/dL (ref 30.0–36.0)
MCHC: 33.5 g/dL (ref 30.0–36.0)
MCHC: 34.1 g/dL (ref 30.0–36.0)
MCV: 89.9 fL (ref 80.0–100.0)
MCV: 88.2 fL (ref 80.0–100.0)
MCV: 88 fL (ref 80.0–100.0)
Platelets: 184 10*3/uL (ref 150–400)
Platelets: 169 10*3/uL (ref 150–400)
Platelets: 112 10*3/uL — ABNORMAL LOW (ref 150–400)
RBC: 2.78 MIL/uL — ABNORMAL LOW (ref 4.22–5.81)
RBC: 2.37 MIL/uL — ABNORMAL LOW (ref 4.22–5.81)
RBC: 2.33 MIL/uL — ABNORMAL LOW (ref 4.22–5.81)
RDW: 13.7 % (ref 11.5–15.5)
RDW: 13.7 % (ref 11.5–15.5)
RDW: 13.5 % (ref 11.5–15.5)
WBC: 22 10*3/uL — ABNORMAL HIGH (ref 4.0–10.5)
WBC: 18.7 10*3/uL — ABNORMAL HIGH (ref 4.0–10.5)
WBC: 11.4 10*3/uL — ABNORMAL HIGH (ref 4.0–10.5)
nRBC: 0.1 % (ref 0.0–0.2)
nRBC: 0.3 % — ABNORMAL HIGH (ref 0.0–0.2)
nRBC: 0.5 % — ABNORMAL HIGH (ref 0.0–0.2)

## 2022-05-18 LAB — CBC WITH DIFFERENTIAL/PLATELET
Abs Immature Granulocytes: 0.53 10*3/uL — ABNORMAL HIGH (ref 0.00–0.07)
Basophils Absolute: 0.1 10*3/uL (ref 0.0–0.1)
Basophils Relative: 0 %
Eosinophils Absolute: 0 10*3/uL (ref 0.0–0.5)
Eosinophils Relative: 0 %
HCT: 25.3 % — ABNORMAL LOW (ref 39.0–52.0)
Hemoglobin: 8 g/dL — ABNORMAL LOW (ref 13.0–17.0)
Immature Granulocytes: 2 %
Lymphocytes Relative: 16 %
Lymphs Abs: 3.5 10*3/uL (ref 0.7–4.0)
MCH: 28.9 pg (ref 26.0–34.0)
MCHC: 31.6 g/dL (ref 30.0–36.0)
MCV: 91.3 fL (ref 80.0–100.0)
Monocytes Absolute: 2.8 10*3/uL — ABNORMAL HIGH (ref 0.1–1.0)
Monocytes Relative: 13 %
Neutro Abs: 15.1 10*3/uL — ABNORMAL HIGH (ref 1.7–7.7)
Neutrophils Relative %: 69 %
Platelets: 207 10*3/uL (ref 150–400)
RBC: 2.77 MIL/uL — ABNORMAL LOW (ref 4.22–5.81)
RDW: 13.7 % (ref 11.5–15.5)
WBC: 22 10*3/uL — ABNORMAL HIGH (ref 4.0–10.5)
nRBC: 0.1 % (ref 0.0–0.2)

## 2022-05-18 LAB — RENAL FUNCTION PANEL
Albumin: 3 g/dL — ABNORMAL LOW (ref 3.5–5.0)
Anion gap: 10 (ref 5–15)
BUN: 29 mg/dL — ABNORMAL HIGH (ref 6–20)
CO2: 23 mmol/L (ref 22–32)
Calcium: 8.1 mg/dL — ABNORMAL LOW (ref 8.9–10.3)
Chloride: 106 mmol/L (ref 98–111)
Creatinine, Ser: 1.91 mg/dL — ABNORMAL HIGH (ref 0.61–1.24)
GFR, Estimated: 40 mL/min — ABNORMAL LOW (ref >60)
Glucose, Bld: 161 mg/dL — ABNORMAL HIGH (ref 70–99)
Phosphorus: 5.3 mg/dL — ABNORMAL HIGH (ref 2.5–4.6)
Potassium: 4.2 mmol/L (ref 3.5–5.1)
Sodium: 139 mmol/L (ref 135–145)

## 2022-05-18 LAB — FIBRINOGEN
Fibrinogen: 180 mg/dL — ABNORMAL LOW (ref 210–475)
Fibrinogen: 193 mg/dL — ABNORMAL LOW (ref 210–475)
Fibrinogen: 198 mg/dL — ABNORMAL LOW (ref 210–475)
Fibrinogen: 212 mg/dL (ref 210–475)

## 2022-05-18 LAB — APTT: aPTT: 101 seconds — ABNORMAL HIGH (ref 24–36)

## 2022-05-18 LAB — PROCALCITONIN: Procalcitonin: 0.19 ng/mL

## 2022-05-18 LAB — HEPARIN LEVEL (UNFRACTIONATED): Heparin Unfractionated: 0.82 IU/mL — ABNORMAL HIGH (ref 0.30–0.70)

## 2022-05-18 LAB — PREPARE RBC (CROSSMATCH)

## 2022-05-18 LAB — PROTIME-INR
INR: 1.3 — ABNORMAL HIGH (ref 0.8–1.2)
Prothrombin Time: 16.2 seconds — ABNORMAL HIGH (ref 11.4–15.2)

## 2022-05-18 LAB — MAGNESIUM: Magnesium: 2.1 mg/dL (ref 1.7–2.4)

## 2022-05-18 MED ORDER — HYDROMORPHONE HCL 1 MG/ML IJ SOLN
0.5000 mg | INTRAMUSCULAR | Status: DC | PRN
  Administered 2022-05-18 – 2022-05-19 (×3): 1 mg via INTRAVENOUS
  Filled 2022-05-18 (×3): qty 1

## 2022-05-18 MED ORDER — HYDROMORPHONE HCL 1 MG/ML IJ SOLN
0.5000 mg | Freq: Once | INTRAMUSCULAR | Status: AC
  Administered 2022-05-18: 0.5 mg via INTRAVENOUS

## 2022-05-18 MED ORDER — SODIUM CHLORIDE 0.9 % IV BOLUS
500.0000 mL | Freq: Once | INTRAVENOUS | Status: AC
  Administered 2022-05-18: 500 mL via INTRAVENOUS

## 2022-05-18 MED ORDER — CEFAZOLIN SODIUM-DEXTROSE 1-4 GM/50ML-% IV SOLN
1.0000 g | Freq: Three times a day (TID) | INTRAVENOUS | Status: DC
  Administered 2022-05-18 – 2022-05-21 (×9): 1 g via INTRAVENOUS
  Filled 2022-05-18 (×10): qty 50

## 2022-05-18 MED ORDER — SODIUM CHLORIDE 0.9% IV SOLUTION: Freq: Once | INTRAVENOUS | Status: DC

## 2022-05-18 MED ORDER — LACTATED RINGERS IV SOLN: INTRAVENOUS | Status: DC

## 2022-05-18 MED ORDER — ACETAMINOPHEN 325 MG PO TABS
650.0000 mg | ORAL_TABLET | ORAL | Status: DC | PRN
  Administered 2022-05-18: 650 mg via ORAL
  Filled 2022-05-18: qty 2

## 2022-05-18 MED ORDER — SODIUM CHLORIDE 0.9 % IV SOLN: INTRAVENOUS | Status: DC

## 2022-05-18 MED ORDER — LACTATED RINGERS IV BOLUS
1000.0000 mL | Freq: Once | INTRAVENOUS | Status: AC
  Administered 2022-05-18: 1000 mL via INTRAVENOUS

## 2022-05-18 NOTE — Progress Notes (Signed)
Called to see patient for bleeding from left groin at 12:30 AM.  On arrival RN is holding pressure left groin effectively.  I took over held pressure for 15 minutes.  Inspection of the groin demonstrated good control and a pressure dressing was placed with foam tape.  Heparin was stopped when the bleeding started.  At the time the bleeding was identified the most recent heparin level was from 1800 and was 0.92.  After that lab the heparin drip was decreased slightly.  Assessment and plan I have obtained control of the left groin.  However I do not feel comfortable continuing the heparin.  This puts [place] in a very difficult position given his hyper coagulable state.  The gentleman did rethrombosis the right limb while on tPA.  At the present time we have control of both groins and both feet have good Doppler signals.  I will reassess later today for initiation of Eliquis and aspirin.

## 2022-05-18 NOTE — Progress Notes (Signed)
Progress Note    05/18/2022 11:21 AM 1 Day Post-Op  Subjective:  [name] is a 57 year old male who is status postop day 1 from an abdominal aortogram with bilateral lower extremity angiography, angioplasty and stent placement.  He also underwent a mechanical thrombectomy of the right external iliac artery through the right limb of the aortobifemoral bypass graft.  Overnight the patient was noted to be bleeding from his left groin site.  Dr. Delana Meyer personally came and see the patient.  There was pressure held to the groin site for 2 hours.  Then Dr. Delana Meyer applied a pressure dressing to the area.  Never lost pulses to his lower extremities.  Both extremities remain warm to touch.  Patient is resting comfortably in ICU in bed this morning.  Patient's groin incisions with dressings were examined and changes morning.  Patient's linens were changed at the same time and patient was rolled from side-to-side to observe the patient's right and left flanks.  Patient is noted to have a hematoma to the left flank that is tender to touch and palpation.  PAD assistive device for pressure on the right groin was removed this morning.  Patient remains with central line in the right groin.  Was redressed sterilely.  No hematoma seromas or infections to know from either groin.  Bilateral lower extremities remain warm with strong positive Doppler pulses.  Patient's heparin drip was stopped last night currently the patient is not on any anticoagulation.  Schnier is aware.  Vitals all remained stable this morning.  Patient has no other complaints.   Vitals:   05/18/22 0930 05/18/22 1000  BP: 109/67 101/74  Pulse: 95 (!) 124  Resp: 13 (!) 21  Temp:    SpO2: 95% 93%   Physical Exam: Cardiac:  RRR No murmurs or Gallops S1 and S2 normal Lungs: Clear on auscultation throughout bilaterally Incisions: Bilateral groin incisions.  Standard dressing on the right groin.  Left groin has a pressure dressing  post bleed from last night.  No hematoma seroma or infection to note around that site. Extremities: Bilateral lower extremities warm to touch with positive Doppler pulses popliteal, posttibial, dorsalis pedis. Abdomen: Positive bowel sounds, soft, tender on palpation due to left flank hematoma.  Nondistended. Neurologic: AAOx3, neuro intact, follows all commands appropriately.   CBC    Component Value Date/Time   WBC 18.7 (H) 05/18/2022 1032   RBC 2.37 (L) 05/18/2022 1032   HGB 7.0 (L) 05/18/2022 1032   HCT 20.9 (L) 05/18/2022 1032   PLT 169 05/18/2022 1032   MCV 88.2 05/18/2022 1032   MCH 29.5 05/18/2022 1032   MCHC 33.5 05/18/2022 1032   RDW 13.7 05/18/2022 1032   LYMPHSABS 3.5 05/17/2022 2341   MONOABS 2.8 (H) 05/17/2022 2341   EOSABS 0.0 05/17/2022 2341   BASOSABS 0.1 05/17/2022 2341    BMET    Component Value Date/Time   NA 139 05/18/2022 0459   NA 142 11/21/2021 1035   K 4.2 05/18/2022 0459   CL 106 05/18/2022 0459   CO2 23 05/18/2022 0459   GLUCOSE 161 (H) 05/18/2022 0459   BUN 29 (H) 05/18/2022 0459   BUN 16 11/21/2021 1035   CREATININE 1.91 (H) 05/18/2022 0459   CALCIUM 8.1 (L) 05/18/2022 0459   GFRNONAA 40 (L) 05/18/2022 0459   GFRAA >60 05/27/2017 0454    INR    Component Value Date/Time   INR 1.3 (H) 05/17/2022 2340     Intake/Output Summary (Last 24 hours)  at 05/18/2022 1121 Last data filed at 05/18/2022 [id number] Gross per 24 hour  Intake 1896.47 ml  Output 845 ml  Net 1051.47 ml     Assessment/Plan:  57 y.o. male is s/p abdominal aortogram with bilateral lower extremity angiography, angioplasty and stent placement.  He also underwent a mechanical thrombectomy of the right external iliac artery through the right limb of the aortobifemoral bypass graft 1 Day Post-Op   PLAN: Patient will remain in ICU today.  Patient's heparin infusion was stopped at approximately 2 AM.  Dr. Delana Meyer will reevaluate the patient at 2 PM and determine whether the  initiation of Eliquis and aspirin is appropriate at this time due to prior bleeding from left groin incision site. Continue to treat his pain appropriately. Patient to remain on bedrest until further anticoagulation has been determined.  DVT prophylaxis:  None post bleeding from 2 Am last night.    Drema Pry [facility] 05/18/2022 11:21 AM

## 2022-05-18 NOTE — Progress Notes (Signed)
   BRIEF PCCM NOTE  Pt was seen earlier by [facility] provider Aleskerov.  Please see his note for full assessment & plan.  BRIEF PT DESCRIPTION / SYNOPSIS :  57 yo male with severe atherosclerotic changes of bilateral lower extremities with rest pain with associated with pre ulcerative changes and impending tissue loss of both lower extremities. He is status postop day 1 from an abdominal aortogram with bilateral lower extremity angiography, angioplasty and stent placement.  He also underwent a mechanical thrombectomy of the right external iliac artery through the right limb of the aortobifemoral bypass graft.  Course has been complicated by Acute Blood Loss Anemia due to hematoma/hemorrhage from left groin site in setting of required anticoagulants /thrombolytics for limb salvage.  SUBJECTIVE / INTERVAL HISTORY :  -Pt currently resting quietly, on room air -BP is soft, but SBP >90 and MAP >65, otherwise hemodynamically stable -Pt recently received 1 unit pRBC for decrease of Hgb to 7.0 from 8.2 earlier today -Follow H&H post blood is unchanged with Hgb 7.0 -Discussed with Dr. Delana Meyer, will transfuse 2 more units of pRBC's, and if follow up H&H without expected rise in Hgb, will obtain CT Angiogram Abdomen at that time  OBJECTIVE :   Today's Vitals   05/18/22 1600 05/18/22 1630 05/18/22 1700 05/18/22 1715  BP: 91/78 (!) 88/54 (!) 94/56 (!) 94/52  Pulse: (!) 103 98 91 92  Resp: (!) 23 18 15 15   Temp: 98.2 F (36.8 C)  98.1 F (36.7 C) 98.1 F (36.7 C)  TempSrc:   Oral Oral  SpO2: 99% 96% 95% 98%  Weight:      Height:      PainSc:       Body mass index is 29.28 kg/m.   ASSESSMENT / PLAN :   #Acute Blood Loss Anemia due to hematoma/hemorrhage from Left groin site -Monitor for S/Sx of bleeding -Trend CBC (H&H q6h) & Fibrinogen -Anticoagulation currently on hold as per Vascular Surgery -Transfuse for Hgb <8 -Discussed with Dr. Delana Meyer, will transfuse 2 additional unit of pRBCs, and  if follow up H&H without expected rise in Hgb,  will obtain CT Angiogram of abdomen at that time        Additional Critical Care Time: 15 minutes  [name], AGACNP-BC [facility] epic messenger for cross cover needs If after hours, please call E-link

## 2022-05-18 NOTE — Progress Notes (Addendum)
ANTICOAGULATION CONSULT NOTE - Initial Consult  Pharmacy Consult for Heparin Indication:  atherosclerotic disease bilateral lower extremities (S/P alteplase)   Allergies  Allergen Reactions   Versed [Midazolam] Rash    raised  red whelps and burning right iv site after recieved fentanyl and versed.  Second procedure pt recieved  IV solumedrol IV benedryl and pepcid po and tolerated both.    Fentanyl Hives    raised red whelps and burning right iv site after recieved fentanyl and versed Second procedure pt recieved  IV solumedrol IV benedryl and pepcid po and tolerated both.      Patient Measurements: Height: 5\' 5"  (165.1 cm) Weight: 79.8 kg (175 lb 14.8 oz) IBW/kg (Calculated) : 61.5 Heparin Dosing Weight: 77.8 kg   Vital Signs: Temp: 97.7 F (36.5 C) (03/14 2000) Temp Source: Oral (03/14 2000) BP: 124/80 (03/14 2200) Pulse Rate: 90 (03/14 2200)  Labs: Recent Labs    05/16/22 0102 05/16/22 0620 05/16/22 1400 05/17/22 0407 05/17/22 0630 05/17/22 1129 05/17/22 1828 05/17/22 2333 05/17/22 2340  HGB 13.7 13.2   < > 11.3*  --  10.1* 9.2* 9.0*  --   HCT 40.1 39.2   < > 34.0*  --  30.6* 27.7* 27.5*  --   PLT 234 212   < > 178  --  158 150  --   --   APTT  --   --   --   --   --   --   --   --  101*  LABPROT  --   --   --   --   --   --   --   --  16.2*  INR  --   --   --   --   --   --   --   --  1.3*  HEPARINUNFRC <0.10* 0.20*   < >  --  0.62  --  0.92*  --  0.82*  CREATININE 0.78 0.72  --  0.85  --   --   --   --   --    < > = values in this interval not displayed.     Estimated Creatinine Clearance: 93.3 mL/min (by C-G formula based on SCr of 0.85 mg/dL).   Medical History: Past Medical History:  Diagnosis Date   Carpal tunnel syndrome of left wrist    DJD (degenerative joint disease)    History of kidney stones    Hyperlipidemia    Leg pain    Low back pain 04/04/2017   PAD (peripheral artery disease) (HCC)    Peripheral vascular disease (HCC)    Right  inguinal hernia     Medications:  Medications Prior to Admission  Medication Sig Dispense Refill Last Dose   acetaminophen (TYLENOL) 325 MG tablet Take 650 mg by mouth daily as needed for moderate pain.   prn at unk   aspirin EC 81 MG tablet Take 81 mg by mouth daily. Swallow whole.   05/15/2022   HYDROcodone-acetaminophen (NORCO/VICODIN) 5-325 MG tablet Take 1 tablet by mouth every 6 (six) hours as needed for severe pain. Must last 30 days. 120 tablet 0 05/15/2022   ibuprofen (ADVIL) 800 MG tablet Take 800 mg by mouth every 8 (eight) hours as needed for moderate pain.   prn at unk   propranolol (INDERAL) 10 MG tablet Take 10 mg by mouth 3 (three) times daily.   05/15/2022   rosuvastatin (CRESTOR) 20 MG tablet Take 20 mg by mouth  daily.   05/15/2022    Assessment: Pharmacy consulted to dose heparin in this 57 year old male admitted to vascular surgery for TPA thrombolysis of bilateral lower extremities due to atherosclerotic occlusion and possible aortobifemoral bypass.  Pt was started on alteplase on 3/13 @ 1004 at 0.5 mg/hr , rate decreased to 0.25 mg/hr at 1813.  Heparin gtt started on 3/13 @ 400 units/hr to run continuously   Pt developed hematoma around 2100.  MD would like to d/c alteplase and increase heparin infusion to achieve goal HL of 0.3 - 0.7 but NO BOLUS.  Heparin infusion held until 3/14 @ 0000 per MD request and then stopped again this AM for angioplasty.  03/14:  HL @ 0630 = 0.62, therapeutic X 1  03/14:  Heparin stopped @ around 0700 03/14:  Heparin resumed @ 1130 at previous rate of 1250 un/hr (no initial bolus) 3/14 1828 HL 0.92, Supratherapeutic  3/14 2340 HL 0.82, Supratherapeutic   Goal of Therapy:  Heparin level 0.3-0.7 units/ml Monitor platelets by anticoagulation protocol: Yes Fibrinogen >/= 100 (per MD recommended value)    Plan:  Heparin supratherapeutic Reduce heparin infusion to 950 un/hr  Check HL in 6 hrs following rate change CBC daily while on  heparin   3/15 @ 0130:   RN states heparin gtt being held due to bleeding;  Dr Delana Meyer at bedside, aware of elevated HL.   3/15 @ 0530:  RN states it took 2 1/2 hrs to get bleeding under control.  Anticipate Dr Delana Meyer will d/c heparin and begin Eliquis later on this AM.   Violeta Gelinas D, PharmD Clinical Pharmacist   05/18/2022 1:14 AM

## 2022-05-18 NOTE — Progress Notes (Signed)
NAME:  [name], MRN:  [id number], DOB:  [birth year], LOS: 3 ADMISSION DATE:  05/15/2022, CONSULTATION DATE: 05/15/22 REFERRING MD: Dr. Gilda Crease, CHIEF COMPLAINT:  Bilateral Lower Extremity Rest Pain    History of Present Illness:  57 yo male with severe atherosclerotic changes of bilateral lower extremities with rest pain with associated with pre ulcerative changes and impending tissue loss of both lower extremities.  He presented to [facility] on 03/12 for a bilateral angio with thrombolysis and bilateral peripheral vascular thrombectomy.  Postop pt admitted to ICU for thrombolytic infusion and heparin gtt overnight in preparation for repeat arteriogram on 03/13.  PCCM team consulted to assist with management.    05/18/22- significant bleeding overnight from L groin.  Heparin dcd brisk arterial bleed has resolved for now. Patient in mild distress from pain.   Pertinent  Medical History  DJD Kidney Stones  HLD Lower Back Pain  PAD  PVD  Right Inguinal Hernia  Chronic Pain Syndrome   Significant Hospital Events: Including procedures, antibiotic start and stop dates in addition to other pertinent events   03/12: Pt admitted to ICU s/p bilateral angio with thrombolysis and bilateral peripheral vascular thrombectomy  05/17/22- Patient reports improved pain at left groin hematoma.  He is on room air non labored breathing.  Patient is eating but reports low appetite. He has been making adequate urine but has not had BM in 5 days. Mild decrement in h/h but not worrisome for transfusion need.  Overall he appears stable now.  05/18/22- patient seen and examined at bedside this am.  He had large amount of bleeding from left groin.  This has now subsided and patient is improved. There is interval slight decrement in h/h.  His wbc count did increment so we have sent septic workup and placed on antimicrobials empirically. Overall he is more stable now compared to overnight.    Interim History /  Subjective:  Pt resting comfortably states his pain has improved following administration of pain medication prior to arrival to ICU   Objective   Blood pressure 109/67, pulse 95, temperature (!) 97.2 F (36.2 C), temperature source Oral, resp. rate 13, height 5\' 5"  (1.651 m), weight 79.8 kg, SpO2 95 %.        Intake/Output Summary (Last 24 hours) at 05/18/2022 1012 Last data filed at 05/18/2022 4098 Gross per 24 hour  Intake 1896.47 ml  Output 845 ml  Net 1051.47 ml    Filed Weights   05/15/22 1453 05/17/22 0800  Weight: 79.8 kg 79.8 kg    Examination: General:  NAD resting comfortably  HENT: Supple, no JVD  Lungs: Clear throughout, even, non labored  Cardiovascular: NSR, s1s2, rrr, no m/r/g, unable to palpate/doppler distal pulses  Abdomen: +BS x4, soft, obese, non tender, non distended  Extremities: Normal bulk, moves all extremities, bilateral feet cool to touch Skin: Bilateral groin vascular sites with left groin hematoma Neuro: Alert and oriented, follows commands, PERRLA  GU: Indwelling foley catheter draining yellow urine   Resolved Hospital Problem list     Assessment & Plan:   Atherosclerotic occlusive disease bilateral lower extremities with rest pain bilateral lower extremities; complication of vascular device with thrombosis aortobifemoral bypass graft s/p angiography with thrombolytic therapy  - Prn morphine for pain management  - Maintain map >65 - Continue heparin gtt and thrombolytic therapy overnight per vascular surgery  - Monitor for s/sx of bleeding  - Serial CBC and fibrinogen - Bleeding precautions  - Continue bedrest           -  05/18/22- patient s/p blood cultures, started antibiotics, monitoring h/h,   Best Practice (right click and "Reselect all SmartList Selections" daily)   Diet/type: clear liquids DVT prophylaxis: systemic heparin GI prophylaxis: H2B Lines: Central line Foley:  Yes, and it is still needed Code Status:  full code Last  date of multidisciplinary goals of care discussion [N/A]  Labs   CBC: Recent Labs  Lab 05/16/22 2225 05/17/22 0407 05/17/22 1129 05/17/22 1828 05/17/22 2333 05/18/22 0459  WBC 15.0* 14.6* 10.3 11.3*  --  22.0*  HGB 12.0* 11.3* 10.1* 9.2* 9.0* 8.2*  HCT 35.4* 34.0* 30.6* 27.7* 27.5* 25.0*  MCV 86.3 88.1 88.2 87.9  --  89.9  PLT 201 178 158 150  --  184     Basic Metabolic Panel: Recent Labs  Lab 05/15/22 1908 05/16/22 0102 05/16/22 0620 05/17/22 0407 05/18/22 0459  NA 136 134* 136 139 139  K 3.8 3.8 4.1 4.2 4.2  CL 106 103 108 105 106  CO2 24 24 24 27 23   GLUCOSE 128* 170* 129* 128* 161*  BUN 17 18 17 19  29*  CREATININE 0.58* 0.78 0.72 0.85 1.91*  CALCIUM 9.0 8.7* 8.8* 8.5* 8.1*  MG 2.1  --  2.0 1.9 2.1  PHOS 3.0  --   --  4.3 5.3*    GFR: Estimated Creatinine Clearance: 41.5 mL/min (A) (by C-G formula based on SCr of 1.91 mg/dL (H)). Recent Labs  Lab 05/17/22 0407 05/17/22 1129 05/17/22 1828 05/18/22 0459  WBC 14.6* 10.3 11.3* 22.0*     Liver Function Tests: Recent Labs  Lab 05/16/22 0102 05/17/22 0407 05/18/22 0459  AST 39  --   --   ALT 35  --   --   ALKPHOS 62  --   --   BILITOT 1.9*  --   --   PROT 6.6  --   --   ALBUMIN 3.7 3.4* 3.0*    No results for input(s): "LIPASE", "AMYLASE" in the last 168 hours. No results for input(s): "AMMONIA" in the last 168 hours.  ABG No results found for: "PHART", "PCO2ART", "PO2ART", "HCO3", "TCO2", "ACIDBASEDEF", "O2SAT"   Coagulation Profile: Recent Labs  Lab 05/17/22 2340  INR 1.3*    Cardiac Enzymes: No results for input(s): "CKTOTAL", "CKMB", "CKMBINDEX", "TROPONINI" in the last 168 hours.  HbA1C: No results found for: "HGBA1C"  CBG: Recent Labs  Lab 05/15/22 1829  GLUCAP 114*     Review of Systems:   Gen:  fever, chills, weight change, fatigue, night sweats HEENT: Denies blurred vision, double vision, hearing loss, tinnitus, sinus congestion, rhinorrhea, sore throat, neck  stiffness, dysphagia PULM: Denies shortness of breath, cough, sputum production, hemoptysis, wheezing CV: Denies chest pain, edema, orthopnea, paroxysmal nocturnal dyspnea, palpitations GI: Denies abdominal pain, nausea, vomiting, diarrhea, hematochezia, melena, constipation, change in bowel habits GU: Denies dysuria, hematuria, polyuria, oliguria, urethral discharge Endocrine: Denies hot or cold intolerance, polyuria, polyphagia or appetite change Derm: Denies rash, dry skin, scaling or peeling skin change Heme: Denies easy bruising, bleeding, bleeding gums Neuro: headache, numbness, weakness, slurred speech, loss of memory or consciousness   Past Medical History:  He,  has a past medical history of Carpal tunnel syndrome of left wrist, DJD (degenerative joint disease), History of kidney stones, Hyperlipidemia, Leg pain, Low back pain (04/04/2017), PAD (peripheral artery disease) (HCC), Peripheral vascular disease (HCC), and Right inguinal hernia.   Surgical History:   Past Surgical History:  Procedure Laterality Date   AORTA - BILATERAL FEMORAL ARTERY BYPASS  GRAFT N/A 05/22/2017   Procedure: AORTA BIFEMORAL BYPASS GRAFT, Aortic bilateral, and internal ischemia;  Surgeon: Renford Dills, MD;  Location: [facility] ORS;  Service: Vascular;  Laterality: N/A;   CENTRAL VENOUS CATHETER INSERTION Left 05/22/2017   Procedure: INSERTION CENTRAL LINE ADULT Left Internal Jugular;  Surgeon: Renford Dills, MD;  Location: [facility] OR[facility];  Service: Vascular;  Laterality: Left;   COLONOSCOPY WITH PROPOFOL N/A 03/27/2017   Procedure: COLONOSCOPY WITH PROPOFOL;  Surgeon: Earline Mayotte, MD;  Location: [facility];  Service: Endoscopy;  Laterality: N/A;   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   Procedure: ENDOSCOPIC LEFT CARPAL TUNNEL RELEASE AND 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.;  Surgeon: Christena Flake, MD;  Location: [facility] ORS;  Service: Orthopedics;  Laterality: Left;    INSERTION OF MESH Right 06/28/2021   Procedure: INSERTION OF MESH;  Surgeon: Campbell Lerner, MD;  Location: [facility] OR[facility];  Service: General;  Laterality: Right;   KIDNEY STONE SURGERY     KNEE SURGERY Left 2006   LOWER EXTREMITY INTERVENTION Bilateral 05/15/2022   Procedure: LOWER EXTREMITY INTERVENTION;  Surgeon: Renford Dills, MD;  Location: [facility];  Service: Cardiovascular;  Laterality: Bilateral;   LOWER EXTREMITY INTERVENTION Bilateral 05/16/2022   Procedure: LOWER EXTREMITY INTERVENTION;  Surgeon: Renford Dills, MD;  Location: [facility];  Service: Cardiovascular;  Laterality: Bilateral;   LOWER EXTREMITY INTERVENTION Bilateral 05/17/2022   Procedure: LOWER EXTREMITY INTERVENTION;  Surgeon: Renford Dills, MD;  Location: [facility];  Service: Cardiovascular;  Laterality: Bilateral;   ROBOTIC ASSISTED LAPAROSCOPIC LYSIS OF ADHESION N/A 06/28/2021   Procedure: XI ROBOTIC ASSISTED LAPAROSCOPIC LYSIS OF ADHESION;  Surgeon: Campbell Lerner, MD;  Location: [facility];  Service: General;  Laterality: N/A;     Social History:   reports that he has been smoking cigarettes. He has a 7.50 pack-year smoking history. He has been exposed to tobacco smoke. He has never used smokeless tobacco. He reports that he does not drink alcohol and does not use drugs.   Family History:  His family history includes Alzheimer's disease in his father; Diabetes in his mother; Heart disease in his mother.   Allergies Allergies  Allergen Reactions   Versed [Midazolam] Rash    raised  red whelps and burning right iv site after recieved fentanyl and versed.  Second procedure pt recieved  IV solumedrol IV benedryl and pepcid po and tolerated both.    Fentanyl Hives    raised red whelps and burning right iv site after recieved fentanyl and versed Second procedure pt recieved  IV solumedrol IV benedryl and pepcid po and tolerated both.       Home Medications  Prior to  Admission medications   Medication Sig Start Date End Date Taking? Authorizing Provider  aspirin EC 81 MG tablet Take 81 mg by mouth daily. Swallow whole.   Yes [provider]  HYDROcodone-acetaminophen (NORCO/VICODIN) 5-325 MG tablet Take 1 tablet by mouth every 6 (six) hours as needed for severe pain. Must last 30 days. 05/06/22 06/05/22 Yes Edward Jolly, MD  propranolol (INDERAL) 10 MG tablet Take 10 mg by mouth 3 (three) times daily. 08/03/21  Yes [provider]  rosuvastatin (CRESTOR) 20 MG tablet Take 20 mg by mouth daily. 08/12/21  Yes [provider]  acetaminophen (TYLENOL) 325 MG tablet Take 650 mg by mouth daily as needed for moderate pain.    [provider]  ibuprofen (ADVIL) 800 MG tablet Take  800 mg by mouth every 8 (eight) hours as needed for moderate pain.    [provider]       Critical care provider statement:   Total critical care time: 33 minutes   Performed by: Karna Christmas MD   Critical care time was exclusive of separately billable procedures and treating other patients.   Critical care was necessary to treat or prevent imminent or life-threatening deterioration.   Critical care was time spent personally by me on the following activities: development of treatment plan with patient and/or surrogate as well as nursing, discussions with consultants, evaluation of patient's response to treatment, examination of patient, obtaining history from patient or surrogate, ordering and performing treatments and interventions, ordering and review of laboratory studies, ordering and review of radiographic studies, pulse oximetry and re-evaluation of patient's condition.    Vida Rigger, M.D.  Pulmonary & Critical Care Medicine

## 2022-05-18 NOTE — Progress Notes (Signed)
  Transition of Care [facility]) Screening Note   Patient Details  Name: [name] Date of Birth: [birth year]   Transition of Care [employer]) CM/SW Contact:    Tiburcio Bash, LCSW Phone Number: 05/18/2022, 10:23 AM  PCP Dr. Rosario Jacks, on RA, stable. No needs noted at this time.   Transition of Care Department [facility]) has reviewed patient and no TOC needs have been identified at this time. We will continue to monitor patient advancement through interdisciplinary progression rounds. If new patient transition needs arise, please place a TOC consult.  Kelby Fam, [place], MSW, [place]

## 2022-05-19 LAB — CBC
HCT: 25.8 % — ABNORMAL LOW (ref 39.0–52.0)
HCT: 26.4 % — ABNORMAL LOW (ref 39.0–52.0)
Hemoglobin: 8.8 g/dL — ABNORMAL LOW (ref 13.0–17.0)
Hemoglobin: 9 g/dL — ABNORMAL LOW (ref 13.0–17.0)
MCH: 28.9 pg (ref 26.0–34.0)
MCH: 28.8 pg (ref 26.0–34.0)
MCHC: 34.1 g/dL (ref 30.0–36.0)
MCHC: 34.1 g/dL (ref 30.0–36.0)
MCV: 84.6 fL (ref 80.0–100.0)
MCV: 84.6 fL (ref 80.0–100.0)
Platelets: 107 10*3/uL — ABNORMAL LOW (ref 150–400)
Platelets: 113 10*3/uL — ABNORMAL LOW (ref 150–400)
RBC: 3.05 MIL/uL — ABNORMAL LOW (ref 4.22–5.81)
RBC: 3.12 MIL/uL — ABNORMAL LOW (ref 4.22–5.81)
RDW: 14.1 % (ref 11.5–15.5)
RDW: 14.5 % (ref 11.5–15.5)
WBC: 8.6 10*3/uL (ref 4.0–10.5)
WBC: 8.3 10*3/uL (ref 4.0–10.5)
nRBC: 0.7 % — ABNORMAL HIGH (ref 0.0–0.2)
nRBC: 0.4 % — ABNORMAL HIGH (ref 0.0–0.2)

## 2022-05-19 LAB — RENAL FUNCTION PANEL
Albumin: 2.8 g/dL — ABNORMAL LOW (ref 3.5–5.0)
Anion gap: 6 (ref 5–15)
BUN: 15 mg/dL (ref 6–20)
CO2: 26 mmol/L (ref 22–32)
Calcium: 8.1 mg/dL — ABNORMAL LOW (ref 8.9–10.3)
Chloride: 107 mmol/L (ref 98–111)
Creatinine, Ser: 0.81 mg/dL (ref 0.61–1.24)
GFR, Estimated: >60 mL/min (ref >60)
Glucose, Bld: 106 mg/dL — ABNORMAL HIGH (ref 70–99)
Phosphorus: 2 mg/dL — ABNORMAL LOW (ref 2.5–4.6)
Potassium: 3.4 mmol/L — ABNORMAL LOW (ref 3.5–5.1)
Sodium: 139 mmol/L (ref 135–145)

## 2022-05-19 LAB — CBC WITH DIFFERENTIAL/PLATELET
Abs Immature Granulocytes: 0.11 10*3/uL — ABNORMAL HIGH (ref 0.00–0.07)
Basophils Absolute: 0 10*3/uL (ref 0.0–0.1)
Basophils Relative: 0 %
Eosinophils Absolute: 0 10*3/uL (ref 0.0–0.5)
Eosinophils Relative: 1 %
HCT: 27 % — ABNORMAL LOW (ref 39.0–52.0)
Hemoglobin: 9.3 g/dL — ABNORMAL LOW (ref 13.0–17.0)
Immature Granulocytes: 1 %
Lymphocytes Relative: 35 %
Lymphs Abs: 3 10*3/uL (ref 0.7–4.0)
MCH: 28.8 pg (ref 26.0–34.0)
MCHC: 34.4 g/dL (ref 30.0–36.0)
MCV: 83.6 fL (ref 80.0–100.0)
Monocytes Absolute: 0.7 10*3/uL (ref 0.1–1.0)
Monocytes Relative: 8 %
Neutro Abs: 4.7 10*3/uL (ref 1.7–7.7)
Neutrophils Relative %: 55 %
Platelets: 103 10*3/uL — ABNORMAL LOW (ref 150–400)
RBC: 3.23 MIL/uL — ABNORMAL LOW (ref 4.22–5.81)
RDW: 14.3 % (ref 11.5–15.5)
WBC: 8.5 10*3/uL (ref 4.0–10.5)
nRBC: 0.2 % (ref 0.0–0.2)

## 2022-05-19 LAB — BLOOD CULTURE ID PANEL (REFLEXED) - BCID2

## 2022-05-19 LAB — FIBRINOGEN: Fibrinogen: 229 mg/dL (ref 210–475)

## 2022-05-19 MED ORDER — APIXABAN 5 MG PO TABS
5.0000 mg | ORAL_TABLET | Freq: Two times a day (BID) | ORAL | Status: DC
  Administered 2022-05-19 – 2022-05-21 (×5): 5 mg via ORAL
  Filled 2022-05-19 (×5): qty 1

## 2022-05-19 MED ORDER — ASPIRIN 81 MG PO TBEC
81.0000 mg | DELAYED_RELEASE_TABLET | Freq: Every day | ORAL | Status: DC
  Administered 2022-05-19 – 2022-05-21 (×3): 81 mg via ORAL
  Filled 2022-05-19 (×3): qty 1

## 2022-05-19 MED ORDER — POTASSIUM CHLORIDE CRYS ER 20 MEQ PO TBCR
40.0000 meq | EXTENDED_RELEASE_TABLET | Freq: Once | ORAL | Status: AC
  Administered 2022-05-19: 40 meq via ORAL
  Filled 2022-05-19: qty 2

## 2022-05-19 NOTE — Progress Notes (Signed)
NAME:  [name], MRN:  [id number], DOB:  October 03, 1965, LOS: 4 ADMISSION DATE:  05/15/2022, CONSULTATION DATE: 05/15/22 REFERRING MD: Dr. Delana Meyer, CHIEF COMPLAINT:  Bilateral Lower Extremity Rest Pain    History of Present Illness:  57 yo male with severe atherosclerotic changes of bilateral lower extremities with rest pain with associated with pre ulcerative changes and impending tissue loss of both lower extremities.  He presented to [facility] on 03/12 for a bilateral angio with thrombolysis and bilateral peripheral vascular thrombectomy.  Postop pt admitted to ICU for thrombolytic infusion and heparin gtt overnight in preparation for repeat arteriogram on 03/13.  PCCM team consulted to assist with management.   .   Pertinent  Medical History  DJD Kidney Stones  HLD Lower Back Pain  PAD  PVD  Right Inguinal Hernia  Chronic Pain Syndrome   Significant Hospital Events: Including procedures, antibiotic start and stop dates in addition to other pertinent events   03/12: Pt admitted to ICU s/p bilateral angio with thrombolysis and bilateral peripheral vascular thrombectomy  05/17/22- Patient reports improved pain at left groin hematoma.  He is on room air non labored breathing.  Patient is eating but reports low appetite. He has been making adequate urine but has not had BM in 5 days. Mild decrement in h/h but not worrisome for transfusion need.  Overall he appears stable now.  05/18/22- patient seen and examined at bedside this am.  He had large amount of bleeding from left groin.  This has now subsided and patient is improved. There is interval slight decrement in h/h.  His wbc count did increment so we have sent septic workup and placed on antimicrobials empirically. Overall he is more stable now compared to overnight.  05/19/22- patient with improved WBC count overnight, blood cultures reveal staph species possibly reflecting contaminant. Procalcitonin very mildly elevated.  Received LR  overnight due to borderline pressures yesterday.  This am improved , h/h stable. Significant electrolyte derrangements noted, have asked pharmacist for consultation with electrolyte management.    Interim History / Subjective:  Pt resting comfortably states his pain has improved following administration of pain medication prior to arrival to ICU   Objective   Blood pressure 101/63, pulse 90, temperature 98.9 F (37.2 C), resp. rate 19, height 5\' 5"  (1.651 m), weight 79.8 kg, SpO2 91 %.        Intake/Output Summary (Last 24 hours) at 05/19/2022 0848 Last data filed at 05/19/2022 [id number] Gross per 24 hour  Intake 5660.19 ml  Output 4250 ml  Net 1410.19 ml    Filed Weights   05/15/22 1453 05/17/22 0800  Weight: 79.8 kg 79.8 kg    Examination: General:  NAD resting comfortably  HENT: Supple, no JVD  Lungs: Clear throughout, even, non labored  Cardiovascular: NSR, s1s2, rrr, no m/r/g, unable to palpate/doppler distal pulses  Abdomen: +BS x4, soft, obese, non tender, non distended  Extremities: Normal bulk, moves all extremities, bilateral feet cool to touch Skin: Bilateral groin vascular sites with left groin hematoma Neuro: Alert and oriented, follows commands, PERRLA  GU: Indwelling foley catheter draining yellow urine   Resolved Hospital Problem list     Assessment & Plan:   Atherosclerotic occlusive disease bilateral lower extremities with rest pain bilateral lower extremities; complication of vascular device with thrombosis aortobifemoral bypass graft s/p angiography with thrombolytic therapy.  - Prn morphine for pain management  - Maintain map >65 - Continue heparin gtt and thrombolytic therapy overnight per vascular surgery  -  Monitor for s/sx of bleeding  - Serial CBC and fibrinogen - Bleeding precautions  - Continue bedrest           -05/18/22- patient s/p blood cultures, started antibiotics, monitoring h/h,   Best Practice (right click and "Reselect all SmartList  Selections" daily)   Diet/type: clear liquids DVT prophylaxis: systemic heparin GI prophylaxis: H2B Lines: Central line Foley:  Yes, and it is still needed Code Status:  full code Last date of multidisciplinary goals of care discussion [N/A]  Labs   CBC: Recent Labs  Lab 05/17/22 2341 05/18/22 0459 05/18/22 1032 05/18/22 1640 05/19/22 0106  WBC 22.0* 22.0* 18.7* 11.4* 8.6  NEUTROABS 15.1*  --   --   --   --   HGB 8.0* 8.2* 7.0* 7.0* 8.8*  HCT 25.3* 25.0* 20.9* 20.5* 25.8*  MCV 91.3 89.9 88.2 88.0 84.6  PLT 207 184 169 112* 107*     Basic Metabolic Panel: Recent Labs  Lab 05/15/22 1908 05/16/22 0102 05/16/22 0620 05/17/22 0407 05/18/22 0459 05/19/22 0430  NA 136 134* 136 139 139 139  K 3.8 3.8 4.1 4.2 4.2 3.4*  CL 106 103 108 105 106 107  CO2 24 24 24 27 23 26   GLUCOSE 128* 170* 129* 128* 161* 106*  BUN 17 18 17 19  29* 15  CREATININE 0.58* 0.78 0.72 0.85 1.91* 0.81  CALCIUM 9.0 8.7* 8.8* 8.5* 8.1* 8.1*  MG 2.1  --  2.0 1.9 2.1  --   PHOS 3.0  --   --  4.3 5.3* 2.0*    GFR: Estimated Creatinine Clearance: 97.9 mL/min (by C-G formula based on SCr of 0.81 mg/dL). Recent Labs  Lab 05/17/22 2341 05/18/22 0459 05/18/22 1032 05/18/22 1640 05/19/22 0106  PROCALCITON 0.19  --   --   --   --   WBC 22.0* 22.0* 18.7* 11.4* 8.6     Liver Function Tests: Recent Labs  Lab 05/16/22 0102 05/17/22 0407 05/18/22 0459 05/19/22 0430  AST 39  --   --   --   ALT 35  --   --   --   ALKPHOS 62  --   --   --   BILITOT 1.9*  --   --   --   PROT 6.6  --   --   --   ALBUMIN 3.7 3.4* 3.0* 2.8*    No results for input(s): "LIPASE", "AMYLASE" in the last 168 hours. No results for input(s): "AMMONIA" in the last 168 hours.  ABG No results found for: "PHART", "PCO2ART", "PO2ART", "HCO3", "TCO2", "ACIDBASEDEF", "O2SAT"   Coagulation Profile: Recent Labs  Lab 05/17/22 2340  INR 1.3*     Cardiac Enzymes: No results for input(s): "CKTOTAL", "CKMB", "CKMBINDEX",  "TROPONINI" in the last 168 hours.  HbA1C: No results found for: "HGBA1C"  CBG: Recent Labs  Lab 05/15/22 1829  GLUCAP 114*     Review of Systems:   Gen:  fever, chills, weight change, fatigue, night sweats HEENT: Denies blurred vision, double vision, hearing loss, tinnitus, sinus congestion, rhinorrhea, sore throat, neck stiffness, dysphagia PULM: Denies shortness of breath, cough, sputum production, hemoptysis, wheezing CV: Denies chest pain, edema, orthopnea, paroxysmal nocturnal dyspnea, palpitations GI: Denies abdominal pain, nausea, vomiting, diarrhea, hematochezia, melena, constipation, change in bowel habits GU: Denies dysuria, hematuria, polyuria, oliguria, urethral discharge Endocrine: Denies hot or cold intolerance, polyuria, polyphagia or appetite change Derm: Denies rash, dry skin, scaling or peeling skin change Heme: Denies easy bruising, bleeding, bleeding gums Neuro:  headache, numbness, weakness, slurred speech, loss of memory or consciousness   Past Medical History:  He,  has a past medical history of Carpal tunnel syndrome of left wrist, DJD (degenerative joint disease), History of kidney stones, Hyperlipidemia, Leg pain, Low back pain (04/04/2017), PAD (peripheral artery disease) ([place]), Peripheral vascular disease ([place]), and Right inguinal hernia.   Surgical History:   Past Surgical History:  Procedure Laterality Date   AORTA - BILATERAL FEMORAL ARTERY BYPASS GRAFT N/A 05/22/2017   Procedure: AORTA BIFEMORAL BYPASS GRAFT, Aortic bilateral, and internal ischemia;  Surgeon: Katha Cabal, MD;  Location: [facility] ORS;  Service: Vascular;  Laterality: N/A;   CENTRAL VENOUS CATHETER INSERTION Left 05/22/2017   Procedure: INSERTION CENTRAL LINE ADULT Left Internal Jugular;  Surgeon: Katha Cabal, MD;  Location: [facility] OR[facility];  Service: Vascular;  Laterality: Left;   COLONOSCOPY WITH PROPOFOL N/A 03/27/2017   Procedure: COLONOSCOPY WITH PROPOFOL;  Surgeon: Robert Bellow, MD;  Location: [facility];  Service: Endoscopy;  Laterality: N/A;   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   Procedure: ENDOSCOPIC LEFT CARPAL TUNNEL RELEASE AND 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.;  Surgeon: Corky Mull, MD;  Location: [facility];  Service: Orthopedics;  Laterality: Left;   INSERTION OF MESH Right 06/28/2021   Procedure: INSERTION OF MESH;  Surgeon: Ronny Bacon, MD;  Location: [facility] OR[facility];  Service: General;  Laterality: Right;   KIDNEY STONE SURGERY     KNEE SURGERY Left 2006   LOWER EXTREMITY INTERVENTION Bilateral 05/15/2022   Procedure: LOWER EXTREMITY INTERVENTION;  Surgeon: Katha Cabal, MD;  Location: [place] [facility];  Service: Cardiovascular;  Laterality: Bilateral;   LOWER EXTREMITY INTERVENTION Bilateral 05/16/2022   Procedure: LOWER EXTREMITY INTERVENTION;  Surgeon: Katha Cabal, MD;  Location: [place] [facility];  Service: Cardiovascular;  Laterality: Bilateral;   LOWER EXTREMITY INTERVENTION Bilateral 05/17/2022   Procedure: LOWER EXTREMITY INTERVENTION;  Surgeon: Katha Cabal, MD;  Location: [place] [facility];  Service: Cardiovascular;  Laterality: Bilateral;   ROBOTIC ASSISTED LAPAROSCOPIC LYSIS OF ADHESION N/A 06/28/2021   Procedure: XI ROBOTIC ASSISTED LAPAROSCOPIC LYSIS OF ADHESION;  Surgeon: Ronny Bacon, MD;  Location: [facility];  Service: General;  Laterality: N/A;     Social History:   reports that he has been smoking cigarettes. He has a 7.50 pack-year smoking history. He has been exposed to tobacco smoke. He has never used smokeless tobacco. He reports that he does not drink alcohol and does not use drugs.   Family History:  His family history includes Alzheimer's disease in his father; Diabetes in his mother; Heart disease in his mother.   Allergies Allergies  Allergen Reactions   Versed [Midazolam] Rash    raised  red whelps and burning right iv site after recieved fentanyl  and versed.  Second procedure pt recieved  IV solumedrol IV benedryl and pepcid po and tolerated both.    Fentanyl Hives    raised red whelps and burning right iv site after recieved fentanyl and versed Second procedure pt recieved  IV solumedrol IV benedryl and pepcid po and tolerated both.       Home Medications  Prior to Admission medications   Medication Sig Start Date End Date Taking? Authorizing Provider  aspirin EC 81 MG tablet Take 81 mg by mouth daily. Swallow whole.   Yes [provider]  HYDROcodone-acetaminophen (NORCO/VICODIN) 5-325 MG tablet Take 1 tablet by mouth every 6 (six) hours as needed for severe pain.  Must last 30 days. 05/06/22 06/05/22 Yes Gillis Santa, MD  propranolol (INDERAL) 10 MG tablet Take 10 mg by mouth 3 (three) times daily. 08/03/21  Yes [provider]  rosuvastatin (CRESTOR) 20 MG tablet Take 20 mg by mouth daily. 08/12/21  Yes [provider]  acetaminophen (TYLENOL) 325 MG tablet Take 650 mg by mouth daily as needed for moderate pain.    [provider]  ibuprofen (ADVIL) 800 MG tablet Take 800 mg by mouth every 8 (eight) hours as needed for moderate pain.    [provider]       Critical care provider statement:   Total critical care time: 33 minutes   Performed by: Lanney Gins MD   Critical care time was exclusive of separately billable procedures and treating other patients.   Critical care was necessary to treat or prevent imminent or life-threatening deterioration.   Critical care was time spent personally by me on the following activities: development of treatment plan with patient and/or surrogate as well as nursing, discussions with consultants, evaluation of patient's response to treatment, examination of patient, obtaining history from patient or surrogate, ordering and performing treatments and interventions, ordering and review of laboratory studies, ordering and review of radiographic studies,  pulse oximetry and re-evaluation of patient's condition.    Ottie Glazier, M.D.  Pulmonary & Critical Care Medicine

## 2022-05-19 NOTE — Progress Notes (Signed)
PHARMACY - PHYSICIAN COMMUNICATION CRITICAL VALUE ALERT - BLOOD CULTURE IDENTIFICATION (BCID)  [name] is an 57 y.o. male who presented to [facility] on 05/15/2022 with a chief complaint of bilateral lower extremity occlusion,  S/P catheter directed thrombolysis.   Assessment:  Staph species growing in 1 of 4 bottles (anaerobic),  most likely a contaminant ,  no signs of infection currently  (include suspected source if known)  Name of physician (or Provider) Contacted: Rufina Falco, NP   Current antibiotics: Cefazolin 1 gm IV Q8H for surgical prophylaxis  Changes to prescribed antibiotics recommended:  Recommendations accepted by provider - will not change abx at this time  Results for orders placed or performed during the hospital encounter of 05/15/22  Blood Culture ID Panel (Reflexed) (Collected: 05/18/2022 11:01 AM)  Result Value Ref Range   Enterococcus faecalis NOT DETECTED NOT DETECTED   Enterococcus Faecium NOT DETECTED NOT DETECTED   Listeria monocytogenes NOT DETECTED NOT DETECTED   Staphylococcus species DETECTED (A) NOT DETECTED   Staphylococcus aureus (BCID) NOT DETECTED NOT DETECTED   Staphylococcus epidermidis NOT DETECTED NOT DETECTED   Staphylococcus lugdunensis NOT DETECTED NOT DETECTED   Streptococcus species NOT DETECTED NOT DETECTED   Streptococcus agalactiae NOT DETECTED NOT DETECTED   Streptococcus pneumoniae NOT DETECTED NOT DETECTED   Streptococcus pyogenes NOT DETECTED NOT DETECTED   A.calcoaceticus-baumannii NOT DETECTED NOT DETECTED   Bacteroides fragilis NOT DETECTED NOT DETECTED   Enterobacterales NOT DETECTED NOT DETECTED   Enterobacter cloacae complex NOT DETECTED NOT DETECTED   Escherichia coli NOT DETECTED NOT DETECTED   Klebsiella aerogenes NOT DETECTED NOT DETECTED   Klebsiella oxytoca NOT DETECTED NOT DETECTED   Klebsiella pneumoniae NOT DETECTED NOT DETECTED   Proteus species NOT DETECTED NOT DETECTED   Salmonella species NOT  DETECTED NOT DETECTED   Serratia marcescens NOT DETECTED NOT DETECTED   Haemophilus influenzae NOT DETECTED NOT DETECTED   Neisseria meningitidis NOT DETECTED NOT DETECTED   Pseudomonas aeruginosa NOT DETECTED NOT DETECTED   Stenotrophomonas maltophilia NOT DETECTED NOT DETECTED   Candida albicans NOT DETECTED NOT DETECTED   Candida auris NOT DETECTED NOT DETECTED   Candida glabrata NOT DETECTED NOT DETECTED   Candida krusei NOT DETECTED NOT DETECTED   Candida parapsilosis NOT DETECTED NOT DETECTED   Candida tropicalis NOT DETECTED NOT DETECTED   Cryptococcus neoformans/gattii NOT DETECTED NOT DETECTED    Natsumi Whitsitt D 05/19/2022  5:31 AM

## 2022-05-19 NOTE — Progress Notes (Signed)
2 Days Post-Op   Subjective/Chief Complaint: Resting. Without complaint. Per nursing- no events overnight. Responded appropriately to pRBC transfusion- HgB 8.8   Objective: Vital signs in last 24 hours: Temp:  [98 F (36.7 C)-100.1 F (37.8 C)] 98.9 F (37.2 C) (03/16 0800) Pulse Rate:  [81-124] 90 (03/16 0800) Resp:  [11-37] 19 (03/16 0800) BP: (85-113)/(50-78) 101/63 (03/16 0800) SpO2:  [90 %-100 %] 91 % (03/16 0800) Last BM Date : 05/14/22 (per patient (pre admit))  Intake/Output from previous day: 03/15 0701 - 03/16 0700 In: 5660.2 [P.O.:60; I.V.:2791.2; Blood:1760; IV Piggyback:1049] Out: 3925 [Urine:3925] Intake/Output this shift: Total I/O In: -  Out: 325 [Urine:325]  General appearance: alert and no distress Resp: clear to auscultation bilaterally Cardio: regular rate and rhythm GI: soft, non distended, LEFT flank tenderness to palpation, no further extension of ecchymosis Extremities: warm, +DP/PT, Right groin- soft, Central line in place, LEFT groin- soft, no hematoma, stable ecchymosis  Lab Results:  Recent Labs    05/18/22 1640 05/19/22 0106  WBC 11.4* 8.6  HGB 7.0* 8.8*  HCT 20.5* 25.8*  PLT 112* 107*   BMET Recent Labs    05/18/22 0459 05/19/22 0430  NA 139 139  K 4.2 3.4*  CL 106 107  CO2 23 26  GLUCOSE 161* 106*  BUN 29* 15  CREATININE 1.91* 0.81  CALCIUM 8.1* 8.1*   PT/INR Recent Labs    05/17/22 2340  LABPROT 16.2*  INR 1.3*   ABG No results for input(s): "PHART", "HCO3" in the last 72 hours.  Invalid input(s): "PCO2", "PO2"  Studies/Results: PERIPHERAL VASCULAR CATHETERIZATION  Result Date: 05/17/2022 See surgical note for result.   Anti-infectives: Anti-infectives (From admission, onward)    Start     Dose/Rate Route Frequency Ordered Stop   05/18/22 1430  ceFAZolin (ANCEF) IVPB 1 g/50 mL premix        1 g 100 mL/hr over 30 Minutes Intravenous Every 8 hours 05/18/22 1331     05/17/22 0822  ceFAZolin (ANCEF) IVPB 1  g/50 mL premix  Status:  Discontinued        over 30 Minutes  Continuous PRN 05/17/22 0823 05/17/22 1048   05/17/22 0753  ceFAZolin (ANCEF) IVPB 2g/100 mL premix  Status:  Discontinued        2 g 200 mL/hr over 30 Minutes Intravenous 30 min pre-op 05/17/22 0753 05/17/22 1042   05/17/22 0753  ceFAZolin (ANCEF) 2-4 GM/100ML-% IVPB       Note to Pharmacy: [name], [name]: cabinet override      05/17/22 0753 05/17/22 1959   05/15/22 1932  ceFAZolin (ANCEF) IVPB 2g/100 mL premix        2 g 200 mL/hr over 30 Minutes Intravenous 30 min pre-op 05/15/22 1932 05/16/22 1056   05/15/22 1440  ceFAZolin (ANCEF) 2-4 GM/100ML-% IVPB       Note to Pharmacy: [name]: cabinet override      05/15/22 1440 05/16/22 0244   05/15/22 1418  ceFAZolin (ANCEF) IVPB 2g/100 mL premix        2 g 200 mL/hr over 30 Minutes Intravenous 30 min pre-op 05/15/22 1418 05/15/22 1511       Assessment/Plan: s/p Procedure(s): LOWER EXTREMITY INTERVENTION (Bilateral) POD #3/4 Recheck HgB this morning- if remains stable will begin Eliquis /ASA Continue Bedrest Monitor Pulse exam  LOS: 4 days    Jamesetta So [name] 05/19/2022

## 2022-05-20 LAB — CBC WITH DIFFERENTIAL/PLATELET
Abs Immature Granulocytes: 0.11 10*3/uL — ABNORMAL HIGH (ref 0.00–0.07)
Abs Immature Granulocytes: 0.09 10*3/uL — ABNORMAL HIGH (ref 0.00–0.07)
Basophils Absolute: 0 10*3/uL (ref 0.0–0.1)
Basophils Absolute: 0 10*3/uL (ref 0.0–0.1)
Basophils Relative: 0 %
Basophils Relative: 0 %
Eosinophils Absolute: 0.1 10*3/uL (ref 0.0–0.5)
Eosinophils Absolute: 0.1 10*3/uL (ref 0.0–0.5)
Eosinophils Relative: 2 %
Eosinophils Relative: 2 %
HCT: 26.3 % — ABNORMAL LOW (ref 39.0–52.0)
HCT: 28.7 % — ABNORMAL LOW (ref 39.0–52.0)
Hemoglobin: 9 g/dL — ABNORMAL LOW (ref 13.0–17.0)
Hemoglobin: 9.8 g/dL — ABNORMAL LOW (ref 13.0–17.0)
Immature Granulocytes: 2 %
Immature Granulocytes: 1 %
Lymphocytes Relative: 38 %
Lymphocytes Relative: 31 %
Lymphs Abs: 2.6 10*3/uL (ref 0.7–4.0)
Lymphs Abs: 2.1 10*3/uL (ref 0.7–4.0)
MCH: 28.7 pg (ref 26.0–34.0)
MCH: 28.8 pg (ref 26.0–34.0)
MCHC: 34.2 g/dL (ref 30.0–36.0)
MCHC: 34.1 g/dL (ref 30.0–36.0)
MCV: 83.8 fL (ref 80.0–100.0)
MCV: 84.4 fL (ref 80.0–100.0)
Monocytes Absolute: 0.5 10*3/uL (ref 0.1–1.0)
Monocytes Absolute: 0.6 10*3/uL (ref 0.1–1.0)
Monocytes Relative: 7 %
Monocytes Relative: 9 %
Neutro Abs: 3.6 10*3/uL (ref 1.7–7.7)
Neutro Abs: 4 10*3/uL (ref 1.7–7.7)
Neutrophils Relative %: 51 %
Neutrophils Relative %: 57 %
Platelets: 115 10*3/uL — ABNORMAL LOW (ref 150–400)
Platelets: 121 10*3/uL — ABNORMAL LOW (ref 150–400)
RBC: 3.14 MIL/uL — ABNORMAL LOW (ref 4.22–5.81)
RBC: 3.4 MIL/uL — ABNORMAL LOW (ref 4.22–5.81)
RDW: 14.4 % (ref 11.5–15.5)
RDW: 14.3 % (ref 11.5–15.5)
WBC: 6.9 10*3/uL (ref 4.0–10.5)
WBC: 7 10*3/uL (ref 4.0–10.5)
nRBC: 0 % (ref 0.0–0.2)
nRBC: 0 % (ref 0.0–0.2)

## 2022-05-20 LAB — BASIC METABOLIC PANEL
Anion gap: 9 (ref 5–15)
BUN: 10 mg/dL (ref 6–20)
CO2: 24 mmol/L (ref 22–32)
Calcium: 8.3 mg/dL — ABNORMAL LOW (ref 8.9–10.3)
Chloride: 103 mmol/L (ref 98–111)
Creatinine, Ser: 0.59 mg/dL — ABNORMAL LOW (ref 0.61–1.24)
GFR, Estimated: >60 mL/min (ref >60)
Glucose, Bld: 94 mg/dL (ref 70–99)
Potassium: 3.4 mmol/L — ABNORMAL LOW (ref 3.5–5.1)
Sodium: 136 mmol/L (ref 135–145)

## 2022-05-20 LAB — TYPE AND SCREEN
ABO/RH(D): A POS
Antibody Screen: NEGATIVE
Unit division: 0
Unit division: 0
Unit division: 0
Unit division: 0

## 2022-05-20 LAB — BPAM RBC
Blood Product Expiration Date: 202404042359
Blood Product Expiration Date: 202404042359
Blood Product Expiration Date: 202404062359
Blood Product Expiration Date: 202404062359
ISSUE DATE / TIME: 202403151256
ISSUE DATE / TIME: 202403151710
ISSUE DATE / TIME: 202403152038
Unit Type and Rh: 6200
Unit Type and Rh: 6200
Unit Type and Rh: 6200
Unit Type and Rh: 6200

## 2022-05-20 LAB — PREPARE RBC (CROSSMATCH)

## 2022-05-20 LAB — MAGNESIUM: Magnesium: 2 mg/dL (ref 1.7–2.4)

## 2022-05-20 LAB — PHOSPHORUS: Phosphorus: 3 mg/dL (ref 2.5–4.6)

## 2022-05-20 MED ORDER — CHLORHEXIDINE GLUCONATE CLOTH 2 % EX PADS
6.0000 | MEDICATED_PAD | Freq: Every day | CUTANEOUS | Status: DC
  Administered 2022-05-21: 6 via TOPICAL

## 2022-05-20 MED ORDER — POTASSIUM CHLORIDE CRYS ER 20 MEQ PO TBCR
40.0000 meq | EXTENDED_RELEASE_TABLET | Freq: Once | ORAL | Status: AC
  Administered 2022-05-20: 40 meq via ORAL
  Filled 2022-05-20: qty 2

## 2022-05-20 NOTE — Evaluation (Signed)
Physical Therapy Evaluation Patient Details Name: [name] MRN: [id number] DOB: 1965-12-31 Today's Date: 05/20/2022  History of Present Illness  [name]  has presented for surgery, with the diagnosis of Bilateral angio w thrombolysis and possible intervention  ASO w rest pain  Critical limb ischemia  Clinical Impression  Patient is independent with ambulation and transfers. No skilled PT needs at this time. Signing off.         Recommendations for follow up therapy are one component of a multi-disciplinary discharge planning process, led by the attending physician.  Recommendations may be updated based on patient status, additional functional criteria and insurance authorization.  Follow Up Recommendations No PT follow up      Assistance Recommended at Discharge PRN  Patient can return home with the following       Equipment Recommendations None recommended by PT  Recommendations for Other Services       Functional Status Assessment Patient has not had a recent decline in their functional status     Precautions / Restrictions Precautions Precautions: None Restrictions Weight Bearing Restrictions: No      Mobility  Bed Mobility               General bed mobility comments: NT patient in recliner    Transfers Overall transfer level: Independent Equipment used: None                    Ambulation/Gait Ambulation/Gait assistance: Independent, Supervision Gait Distance (Feet): 200 Feet Assistive device: None Gait Pattern/deviations: Step-through pattern, Decreased step length - right, Decreased step length - left, Decreased stride length Gait velocity: slightly decreased     General Gait Details: independent mobility no AD, good balance  Stairs            Wheelchair Mobility    Modified Rankin (Stroke Patients Only)       Balance Overall balance assessment: Independent                                            Pertinent Vitals/Pain Pain Assessment Pain Assessment: No/denies pain    Home Living Family/patient expects to be discharged to:: Private residence Living Arrangements: Alone Available Help at Discharge: Family;Available PRN/intermittently Type of Home: House Home Access: Level entry       Home Layout: One level        Prior Function Prior Level of Function : Independent/Modified Independent;Driving             Mobility Comments: independent, goes to the [facility] daily ADLs Comments: independent     Hand Dominance   Dominant Hand: Right    Extremity/Trunk Assessment   Upper Extremity Assessment Upper Extremity Assessment: Overall WFL for tasks assessed    Lower Extremity Assessment Lower Extremity Assessment: Overall WFL for tasks assessed    Cervical / Trunk Assessment Cervical / Trunk Assessment: Normal  Communication   Communication: No difficulties;Prefers language other than English  Cognition Arousal/Alertness: Awake/alert Behavior During Therapy: WFL for tasks assessed/performed Overall Cognitive Status: Within Functional Limits for tasks assessed                                          General Comments      Exercises     Assessment/Plan  PT Assessment Patient does not need any further PT services  PT Problem List         PT Treatment Interventions      PT Goals (Current goals can be found in the Care Plan section)  Acute Rehab PT Goals Patient Stated Goal: return home PT Goal Formulation: With patient Time For Goal Achievement: 05/21/22 Potential to Achieve Goals: Good    Frequency       Co-evaluation PT/OT/SLP Co-Evaluation/Treatment: Yes Reason for Co-Treatment: Necessary to address cognition/behavior during functional activity;To address functional/ADL transfers PT goals addressed during session: Mobility/safety with mobility;Balance         AM-PAC PT "6 Clicks" Mobility  Outcome Measure  Help needed turning from your back to your side while in a flat bed without using bedrails?: None Help needed moving from lying on your back to sitting on the side of a flat bed without using bedrails?: None Help needed moving to and from a bed to a chair (including a wheelchair)?: None Help needed standing up from a chair using your arms (e.g., wheelchair or bedside chair)?: None Help needed to walk in hospital room?: None Help needed climbing 3-5 steps with a railing? : None 6 Click Score: 24    End of Session Equipment Utilized During Treatment: Gait belt Activity Tolerance: Patient tolerated treatment well Patient left: in chair;with call bell/phone within reach Nurse Communication: Mobility status      Time: [id number] PT Time Calculation (min) (ACUTE ONLY): 8 min   Charges:   PT Evaluation $PT Eval Low Complexity: 1 Low          Jaiona Simien, PT, GCS 05/20/22,1:35 PM

## 2022-05-20 NOTE — Progress Notes (Signed)
3 Days Post-Op   Subjective/Chief Complaint: Doing OK. Denies pain. Wants to get out of bed. Otherwise without complaint   Objective: Vital signs in last 24 hours: Temp:  [98 F (36.7 C)-98.7 F (37.1 C)] 98.1 F (36.7 C) (03/17 0800) Pulse Rate:  [76-92] 83 (03/17 1000) Resp:  [13-21] 16 (03/17 1000) BP: (97-118)/(67-96) 105/69 (03/17 1000) SpO2:  [91 %-100 %] 95 % (03/17 1000) Last BM Date : 05/14/22  Intake/Output from previous day: 03/16 0701 - 03/17 0700 In: 153 [I.V.:3; IV Piggyback:150] Out: 3750 [Urine:3750] Intake/Output this shift: Total I/O In: 751.6 [I.V.:701.6; IV Piggyback:50] Out: 450 [Urine:450]  General appearance: alert and no distress Cardio: regular rate and rhythm Extremities: warm, +DP/PT, bilateral groins soft, central line removed, ecchymosis bilateral groins and flank stable  Lab Results:  Recent Labs    05/19/22 1652 05/20/22 0427  WBC 8.5 6.9  HGB 9.3* 9.0*  HCT 27.0* 26.3*  PLT 103* 115*   BMET Recent Labs    05/19/22 0430 05/20/22 0427  NA 139 136  K 3.4* 3.4*  CL 107 103  CO2 26 24  GLUCOSE 106* 94  BUN 15 10  CREATININE 0.81 0.59*  CALCIUM 8.1* 8.3*   PT/INR Recent Labs    05/17/22 2340  LABPROT 16.2*  INR 1.3*   ABG No results for input(s): "PHART", "HCO3" in the last 72 hours.  Invalid input(s): "PCO2", "PO2"  Studies/Results: No results found.  Anti-infectives: Anti-infectives (From admission, onward)    Start     Dose/Rate Route Frequency Ordered Stop   05/18/22 1430  ceFAZolin (ANCEF) IVPB 1 g/50 mL premix        1 g 100 mL/hr over 30 Minutes Intravenous Every 8 hours 05/18/22 1331     05/17/22 0822  ceFAZolin (ANCEF) IVPB 1 g/50 mL premix  Status:  Discontinued        over 30 Minutes  Continuous PRN 05/17/22 0823 05/17/22 1048   05/17/22 0753  ceFAZolin (ANCEF) IVPB 2g/100 mL premix  Status:  Discontinued        2 g 200 mL/hr over 30 Minutes Intravenous 30 min pre-op 05/17/22 0753 05/17/22 1042    05/17/22 0753  ceFAZolin (ANCEF) 2-4 GM/100ML-% IVPB       Note to Pharmacy: Maynor, Erin A: cabinet override      05/17/22 0753 05/17/22 1959   05/15/22 1932  ceFAZolin (ANCEF) IVPB 2g/100 mL premix        2 g 200 mL/hr over 30 Minutes Intravenous 30 min pre-op 05/15/22 1932 05/16/22 1056   05/15/22 1440  ceFAZolin (ANCEF) 2-4 GM/100ML-% IVPB       Note to Pharmacy: Carlynn Spry L: cabinet override      05/15/22 1440 05/16/22 0244   05/15/22 1418  ceFAZolin (ANCEF) IVPB 2g/100 mL premix        2 g 200 mL/hr over 30 Minutes Intravenous 30 min pre-op 05/15/22 1418 05/15/22 1511       Assessment/Plan: s/p Procedure(s): LOWER EXTREMITY INTERVENTION (Bilateral) POD #4/5 HgB remains stable on Eliquis/ASA OK for OOB, begin PT Transfer to PCU  LOS: 5 days    [name] A 05/20/2022

## 2022-05-20 NOTE — Progress Notes (Signed)
NAME:  [name], MRN:  [id number], DOB:  16-Dec-1965, LOS: 5 ADMISSION DATE:  05/15/2022, CONSULTATION DATE: 05/15/22 REFERRING MD: Dr. Delana Meyer, CHIEF COMPLAINT:  Bilateral Lower Extremity Rest Pain    History of Present Illness:  57 yo male with severe atherosclerotic changes of bilateral lower extremities with rest pain with associated with pre ulcerative changes and impending tissue loss of both lower extremities.  He presented to [facility] on 03/12 for a bilateral angio with thrombolysis and bilateral peripheral vascular thrombectomy.  Postop pt admitted to ICU for thrombolytic infusion and heparin gtt overnight in preparation for repeat arteriogram on 03/13.  PCCM team consulted to assist with management.   .   Pertinent  Medical History  DJD Kidney Stones  HLD Lower Back Pain  PAD  PVD  Right Inguinal Hernia  Chronic Pain Syndrome   Significant Hospital Events: Including procedures, antibiotic start and stop dates in addition to other pertinent events   03/12: Pt admitted to ICU s/p bilateral angio with thrombolysis and bilateral peripheral vascular thrombectomy  05/17/22- Patient reports improved pain at left groin hematoma.  He is on room air non labored breathing.  Patient is eating but reports low appetite. He has been making adequate urine but has not had BM in 5 days. Mild decrement in h/h but not worrisome for transfusion need.  Overall he appears stable now.  05/18/22- patient seen and examined at bedside this am.  He had large amount of bleeding from left groin.  This has now subsided and patient is improved. There is interval slight decrement in h/h.  His wbc count did increment so we have sent septic workup and placed on antimicrobials empirically. Overall he is more stable now compared to overnight.  05/19/22- patient with improved WBC count overnight, blood cultures reveal staph species possibly reflecting contaminant. Procalcitonin very mildly elevated.  Received LR  overnight due to borderline pressures yesterday.  This am improved , h/h stable. Significant electrolyte derrangements noted, have asked pharmacist for consultation with electrolyte management.   05/20/22- patient was ambulating with PT on room air today. Patient is being optimized for dc. His family came in yesterday and we discussed hospital course and plan. PCCM is signing off but available if needed.   Interim History / Subjective:  Pt resting comfortably states his pain has improved following administration of pain medication prior to arrival to ICU   Objective   Blood pressure 117/69, pulse 80, temperature 98.1 F (36.7 C), temperature source Oral, resp. rate 14, height 5\' 5"  (1.651 m), weight 79.8 kg, SpO2 95 %.        Intake/Output Summary (Last 24 hours) at 05/20/2022 0901 Last data filed at 05/20/2022 0825 Gross per 24 hour  Intake 904.63 ml  Output 3675 ml  Net -2770.37 ml    Filed Weights   05/15/22 1453 05/17/22 0800  Weight: 79.8 kg 79.8 kg    Examination: General:  NAD resting comfortably  HENT: Supple, no JVD  Lungs: Clear throughout, even, non labored  Cardiovascular: NSR, s1s2, rrr, no m/r/g, unable to palpate/doppler distal pulses  Abdomen: +BS x4, soft, obese, non tender, non distended  Extremities: Normal bulk, moves all extremities, bilateral feet cool to touch Skin: Bilateral groin vascular sites with left groin hematoma Neuro: Alert and oriented, follows commands, PERRLA  GU: Indwelling foley catheter draining yellow urine   Resolved Hospital Problem list     Assessment & Plan:   Atherosclerotic occlusive disease bilateral lower extremities with rest pain bilateral  lower extremities; complication of vascular device with thrombosis aortobifemoral bypass graft s/p angiography with thrombolytic therapy.  - Prn morphine for pain management  - Maintain map >65 - Continue anticoagulation as per surgeon - Monitor for s/sx of bleeding  - Serial CBC and  fibrinogen - Bleeding precautions  - Continue bedrest           -05/18/22- patient s/p blood cultures, started antibiotics, monitoring h/h,   Best Practice (right click and "Reselect all SmartList Selections" daily)   Diet/type: clear liquids DVT prophylaxis: systemic heparin GI prophylaxis: H2B Lines: Central line Foley:  Yes, and it is still needed Code Status:  full code Last date of multidisciplinary goals of care discussion [N/A]  Labs   CBC: Recent Labs  Lab 05/17/22 2341 05/18/22 0459 05/18/22 1640 05/19/22 0106 05/19/22 0902 05/19/22 1652 05/20/22 0427  WBC 22.0*   < > 11.4* 8.6 8.3 8.5 6.9  NEUTROABS 15.1*  --   --   --   --  4.7 3.6  HGB 8.0*   < > 7.0* 8.8* 9.0* 9.3* 9.0*  HCT 25.3*   < > 20.5* 25.8* 26.4* 27.0* 26.3*  MCV 91.3   < > 88.0 84.6 84.6 83.6 83.8  PLT 207   < > 112* 107* 113* 103* 115*   < > = values in this interval not displayed.     Basic Metabolic Panel: Recent Labs  Lab 05/15/22 1908 05/16/22 0102 05/16/22 0620 05/17/22 0407 05/18/22 0459 05/19/22 0430 05/20/22 0427  NA 136   < > 136 139 139 139 136  K 3.8   < > 4.1 4.2 4.2 3.4* 3.4*  CL 106   < > 108 105 106 107 103  CO2 24   < > 24 27 23 26 24   GLUCOSE 128*   < > 129* 128* 161* 106* 94  BUN 17   < > 17 19 29* 15 10  CREATININE 0.58*   < > 0.72 0.85 1.91* 0.81 0.59*  CALCIUM 9.0   < > 8.8* 8.5* 8.1* 8.1* 8.3*  MG 2.1  --  2.0 1.9 2.1  --  2.0  PHOS 3.0  --   --  4.3 5.3* 2.0* 3.0   < > = values in this interval not displayed.    GFR: Estimated Creatinine Clearance: 99.1 mL/min (A) (by C-G formula based on SCr of 0.59 mg/dL (L)). Recent Labs  Lab 05/17/22 2341 05/18/22 0459 05/19/22 0106 05/19/22 0902 05/19/22 1652 05/20/22 0427  PROCALCITON 0.19  --   --   --   --   --   WBC 22.0*   < > 8.6 8.3 8.5 6.9   < > = values in this interval not displayed.     Liver Function Tests: Recent Labs  Lab 05/16/22 0102 05/17/22 0407 05/18/22 0459 05/19/22 0430  AST 39  --    --   --   ALT 35  --   --   --   ALKPHOS 62  --   --   --   BILITOT 1.9*  --   --   --   PROT 6.6  --   --   --   ALBUMIN 3.7 3.4* 3.0* 2.8*    No results for input(s): "LIPASE", "AMYLASE" in the last 168 hours. No results for input(s): "AMMONIA" in the last 168 hours.  ABG No results found for: "PHART", "PCO2ART", "PO2ART", "HCO3", "TCO2", "ACIDBASEDEF", "O2SAT"   Coagulation Profile: Recent Labs  Lab 05/17/22 2340  INR 1.3*     Cardiac Enzymes: No results for input(s): "CKTOTAL", "CKMB", "CKMBINDEX", "TROPONINI" in the last 168 hours.  HbA1C: No results found for: "HGBA1C"  CBG: Recent Labs  Lab 05/15/22 1829  GLUCAP 114*     Review of Systems:   Gen:  fever, chills, weight change, fatigue, night sweats HEENT: Denies blurred vision, double vision, hearing loss, tinnitus, sinus congestion, rhinorrhea, sore throat, neck stiffness, dysphagia PULM: Denies shortness of breath, cough, sputum production, hemoptysis, wheezing CV: Denies chest pain, edema, orthopnea, paroxysmal nocturnal dyspnea, palpitations GI: Denies abdominal pain, nausea, vomiting, diarrhea, hematochezia, melena, constipation, change in bowel habits GU: Denies dysuria, hematuria, polyuria, oliguria, urethral discharge Endocrine: Denies hot or cold intolerance, polyuria, polyphagia or appetite change Derm: Denies rash, dry skin, scaling or peeling skin change Heme: Denies easy bruising, bleeding, bleeding gums Neuro: headache, numbness, weakness, slurred speech, loss of memory or consciousness   Past Medical History:  He,  has a past medical history of Carpal tunnel syndrome of left wrist, DJD (degenerative joint disease), History of kidney stones, Hyperlipidemia, Leg pain, Low back pain (04/04/2017), PAD (peripheral artery disease) ([place]), Peripheral vascular disease ([place]), and Right inguinal hernia.   Surgical History:   Past Surgical History:  Procedure Laterality Date   AORTA - BILATERAL  FEMORAL ARTERY BYPASS GRAFT N/A 05/22/2017   Procedure: AORTA BIFEMORAL BYPASS GRAFT, Aortic bilateral, and internal ischemia;  Surgeon: Katha Cabal, MD;  Location: [facility] ORS;  Service: Vascular;  Laterality: N/A;   CENTRAL VENOUS CATHETER INSERTION Left 05/22/2017   Procedure: INSERTION CENTRAL LINE ADULT Left Internal Jugular;  Surgeon: Katha Cabal, MD;  Location: [facility] ORS;  Service: Vascular;  Laterality: Left;   COLONOSCOPY WITH PROPOFOL N/A 03/27/2017   Procedure: COLONOSCOPY WITH PROPOFOL;  Surgeon: Robert Bellow, MD;  Location: [facility] END[facility];  Service: Endoscopy;  Laterality: N/A;   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   Procedure: ENDOSCOPIC LEFT CARPAL TUNNEL RELEASE AND 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.;  Surgeon: Corky Mull, MD;  Location: [facility] ORS;  Service: Orthopedics;  Laterality: Left;   INSERTION OF MESH Right 06/28/2021   Procedure: INSERTION OF MESH;  Surgeon: Ronny Bacon, MD;  Location: [facility] ORS;  Service: General;  Laterality: Right;   KIDNEY STONE SURGERY     KNEE SURGERY Left 2006   LOWER EXTREMITY INTERVENTION Bilateral 05/15/2022   Procedure: LOWER EXTREMITY INTERVENTION;  Surgeon: Katha Cabal, MD;  Location: [place] [facility];  Service: Cardiovascular;  Laterality: Bilateral;   LOWER EXTREMITY INTERVENTION Bilateral 05/16/2022   Procedure: LOWER EXTREMITY INTERVENTION;  Surgeon: Katha Cabal, MD;  Location: [place] [facility];  Service: Cardiovascular;  Laterality: Bilateral;   LOWER EXTREMITY INTERVENTION Bilateral 05/17/2022   Procedure: LOWER EXTREMITY INTERVENTION;  Surgeon: Katha Cabal, MD;  Location: [place] [facility];  Service: Cardiovascular;  Laterality: Bilateral;   ROBOTIC ASSISTED LAPAROSCOPIC LYSIS OF ADHESION N/A 06/28/2021   Procedure: XI ROBOTIC ASSISTED LAPAROSCOPIC LYSIS OF ADHESION;  Surgeon: Ronny Bacon, MD;  Location: [facility] OR[facility];  Service: General;  Laterality: N/A;      Social History:   reports that he has been smoking cigarettes. He has a 7.50 pack-year smoking history. He has been exposed to tobacco smoke. He has never used smokeless tobacco. He reports that he does not drink alcohol and does not use drugs.   Family History:  His family history includes Alzheimer's disease in his father; Diabetes in his mother; Heart disease in his mother.  Allergies Allergies  Allergen Reactions   Versed [Midazolam] Rash    raised  red whelps and burning right iv site after recieved fentanyl and versed.  Second procedure pt recieved  IV solumedrol IV benedryl and pepcid po and tolerated both.    Fentanyl Hives    raised red whelps and burning right iv site after recieved fentanyl and versed Second procedure pt recieved  IV solumedrol IV benedryl and pepcid po and tolerated both.       Home Medications  Prior to Admission medications   Medication Sig Start Date End Date Taking? Authorizing Provider  aspirin EC 81 MG tablet Take 81 mg by mouth daily. Swallow whole.   Yes [provider]  HYDROcodone-acetaminophen (NORCO/VICODIN) 5-325 MG tablet Take 1 tablet by mouth every 6 (six) hours as needed for severe pain. Must last 30 days. 05/06/22 06/05/22 Yes Gillis Santa, MD  propranolol (INDERAL) 10 MG tablet Take 10 mg by mouth 3 (three) times daily. 08/03/21  Yes [provider]  rosuvastatin (CRESTOR) 20 MG tablet Take 20 mg by mouth daily. 08/12/21  Yes [provider]  acetaminophen (TYLENOL) 325 MG tablet Take 650 mg by mouth daily as needed for moderate pain.    [provider]  ibuprofen (ADVIL) 800 MG tablet Take 800 mg by mouth every 8 (eight) hours as needed for moderate pain.    [provider]         Ottie Glazier, M.D.  Pulmonary & Critical Care Medicine

## 2022-05-20 NOTE — Evaluation (Signed)
Occupational Therapy Evaluation Patient Details Name: [name] MRN: [id number] DOB: October 24, 1965 Today's Date: 05/20/2022   History of Present Illness [name]  has presented for surgery, with the diagnosis of Bilateral angio w thrombolysis and possible intervention  ASO w rest pain  Critical limb ischemia   Clinical Impression   Patient agreeable to OT evaluation. Pt is alert and oriented x4, good safety awareness. Pt is currently functioning at independent for self-care tasks, functional transfers, and functional mobility without an AD. No further skilled OT needs identified. Pt is in agreement. Please reconsult if there is a change in functional status.   Recommendations for follow up therapy are one component of a multi-disciplinary discharge planning process, led by the attending physician.  Recommendations may be updated based on patient status, additional functional criteria and insurance authorization.   Follow Up Recommendations  No OT follow up     Assistance Recommended at Discharge PRN  Patient can return home with the following      Functional Status Assessment  Patient has not had a recent decline in their functional status  Equipment Recommendations  None recommended by OT    Recommendations for Other Services       Precautions / Restrictions Precautions Precautions: None Restrictions Weight Bearing Restrictions: No      Mobility Bed Mobility               General bed mobility comments: NT, pt received/left in recliner    Transfers Overall transfer level: Independent Equipment used: None          Balance Overall balance assessment: Independent         ADL either performed or assessed with clinical judgement   ADL Overall ADL's : Independent           Vision Baseline Vision/History: 1 Wears glasses (readers) Patient Visual Report: No change from baseline       Perception     Praxis      Pertinent  Vitals/Pain Pain Assessment Pain Assessment: No/denies pain     Hand Dominance Right   Extremity/Trunk Assessment Upper Extremity Assessment Upper Extremity Assessment: Overall WFL for tasks assessed   Lower Extremity Assessment Lower Extremity Assessment: Overall WFL for tasks assessed   Cervical / Trunk Assessment Cervical / Trunk Assessment: Normal   Communication Communication Communication: No difficulties;Prefers language other than English   Cognition Arousal/Alertness: Awake/alert Behavior During Therapy: WFL for tasks assessed/performed Overall Cognitive Status: Within Functional Limits for tasks assessed           General Comments       Exercises     Shoulder Instructions      Home Living Family/patient expects to be discharged to:: Private residence Living Arrangements: Alone Available Help at Discharge: Family;Available PRN/intermittently Type of Home: House Home Access: Level entry     Home Layout: One level     Bathroom Shower/Tub: Occupational psychologist: Standard                Prior Functioning/Environment Prior Level of Function : Independent/Modified Independent;Driving             Mobility Comments: independent, goes to the [employer] daily ADLs Comments: independent        OT Problem List:        OT Treatment/Interventions:      OT Goals(Current goals can be found in the care plan section) Acute Rehab OT Goals Patient Stated Goal: return home OT Goal Formulation: All  assessment and education complete, DC therapy  OT Frequency:      Co-evaluation PT/OT/SLP Co-Evaluation/Treatment: Yes Reason for Co-Treatment: For patient/therapist safety;To address functional/ADL transfers PT goals addressed during session: Mobility/safety with mobility;Balance OT goals addressed during session: ADL's and self-care      AM-PAC OT "6 Clicks" Daily Activity     Outcome Measure Help from another person eating meals?:  None Help from another person taking care of personal grooming?: None Help from another person toileting, which includes using toliet, bedpan, or urinal?: None Help from another person bathing (including washing, rinsing, drying)?: None Help from another person to put on and taking off regular upper body clothing?: None Help from another person to put on and taking off regular lower body clothing?: None 6 Click Score: 24   End of Session Nurse Communication: Mobility status  Activity Tolerance: Patient tolerated treatment well Patient left: in chair;with call bell/phone within reach  OT Visit Diagnosis: Other abnormalities of gait and mobility (R26.89)                Time: [id number] OT Time Calculation (min): 8 min Charges:  OT General Charges $OT Visit: 1 Visit OT Evaluation $OT Eval Low Complexity: 1 Low  [facility] MS, OTR/L ascom [phone]  05/20/22, 3:09 PM

## 2022-05-20 NOTE — Consult Note (Signed)
PHARMACY CONSULT NOTE - FOLLOW UP  Pharmacy Consult for Electrolyte Monitoring and Replacement   Recent Labs: Potassium (mmol/L)  Date Value  05/20/2022 3.4 (L)   Magnesium (mg/dL)  Date Value  05/20/2022 2.0   Calcium (mg/dL)  Date Value  05/20/2022 8.3 (L)   Albumin (g/dL)  Date Value  05/19/2022 2.8 (L)  11/21/2021 4.9   Phosphorus (mg/dL)  Date Value  05/20/2022 3.0   Sodium (mmol/L)  Date Value  05/20/2022 136  11/21/2021 142   Corrected Calcium: 9.26  Assessment: 57 yo male with severe atherosclerotic changes of bilateral lower extremities with rest pain with associated with pre ulcerative changes and impending tissue loss of both lower extremities. He presented to [facility] on 03/12 for a bilateral angio with thrombolysis and bilateral peripheral vascular thrombectomy. Postop pt admitted to ICU for thrombolytic infusion and heparin gtt overnight in preparation for repeat arteriogram on 03/13. PCCM has consulted to monitor and replace electrolytes.  Goal of Therapy:  Electrolytes WNL  Plan:  KCL PO 32mEq x 1 dose No further replacement indicated Will f/u with tomorrow with AM labs  Pearla Dubonnet ,PharmD Clinical Pharmacist 05/20/2022 7:53 AM

## 2022-05-21 LAB — CBC WITH DIFFERENTIAL/PLATELET
Abs Immature Granulocytes: 0.09 10*3/uL — ABNORMAL HIGH (ref 0.00–0.07)
Basophils Absolute: 0 10*3/uL (ref 0.0–0.1)
Basophils Relative: 0 %
Eosinophils Absolute: 0.2 10*3/uL (ref 0.0–0.5)
Eosinophils Relative: 3 %
HCT: 29.9 % — ABNORMAL LOW (ref 39.0–52.0)
Hemoglobin: 10.4 g/dL — ABNORMAL LOW (ref 13.0–17.0)
Immature Granulocytes: 1 %
Lymphocytes Relative: 35 %
Lymphs Abs: 2.5 10*3/uL (ref 0.7–4.0)
MCH: 29.2 pg (ref 26.0–34.0)
MCHC: 34.8 g/dL (ref 30.0–36.0)
MCV: 84 fL (ref 80.0–100.0)
Monocytes Absolute: 0.6 10*3/uL (ref 0.1–1.0)
Monocytes Relative: 8 %
Neutro Abs: 3.7 10*3/uL (ref 1.7–7.7)
Neutrophils Relative %: 53 %
Platelets: 139 10*3/uL — ABNORMAL LOW (ref 150–400)
RBC: 3.56 MIL/uL — ABNORMAL LOW (ref 4.22–5.81)
RDW: 14.6 % (ref 11.5–15.5)
WBC: 7.2 10*3/uL (ref 4.0–10.5)
nRBC: 0 % (ref 0.0–0.2)

## 2022-05-21 LAB — BASIC METABOLIC PANEL
Anion gap: 9 (ref 5–15)
BUN: 13 mg/dL (ref 6–20)
CO2: 23 mmol/L (ref 22–32)
Calcium: 9 mg/dL (ref 8.9–10.3)
Chloride: 104 mmol/L (ref 98–111)
Creatinine, Ser: 0.62 mg/dL (ref 0.61–1.24)
GFR, Estimated: >60 mL/min (ref >60)
Glucose, Bld: 95 mg/dL (ref 70–99)
Potassium: 3.8 mmol/L (ref 3.5–5.1)
Sodium: 136 mmol/L (ref 135–145)

## 2022-05-21 LAB — CULTURE, BLOOD (ROUTINE X 2): Special Requests: ADEQUATE

## 2022-05-21 LAB — PHOSPHORUS: Phosphorus: 3.5 mg/dL (ref 2.5–4.6)

## 2022-05-21 LAB — MAGNESIUM: Magnesium: 2.1 mg/dL (ref 1.7–2.4)

## 2022-05-21 MED ORDER — APIXABAN 5 MG PO TABS: 5.0000 mg | ORAL_TABLET | Freq: Two times a day (BID) | ORAL | 6 refills | Status: DC

## 2022-05-21 MED ORDER — OXYCODONE HCL 5 MG PO TABS: 5.0000 mg | ORAL_TABLET | Freq: Two times a day (BID) | ORAL | 0 refills | Status: DC | PRN

## 2022-05-21 MED ORDER — APIXABAN 5 MG PO TABS: 5.0000 mg | ORAL_TABLET | Freq: Two times a day (BID) | ORAL | 6 refills | Status: AC

## 2022-05-21 NOTE — Progress Notes (Signed)
Discharge instruction provided to patient. All questions answered. Sister to provide patient transportation from hospital to home. All personal belongings in room provided to patient and patient confirms all items brought by self to hospital are in his possession. Vitals WDL at time of DC from telemetry monitoring.

## 2022-05-21 NOTE — Consult Note (Signed)
[facility] for Electrolyte Monitoring and Replacement   Recent Labs: Potassium (mmol/L)  Date Value  05/21/2022 3.8   Magnesium (mg/dL)  Date Value  05/21/2022 2.1   Calcium (mg/dL)  Date Value  05/21/2022 9.0   Albumin (g/dL)  Date Value  05/19/2022 2.8 (L)  11/21/2021 4.9   Phosphorus (mg/dL)  Date Value  05/21/2022 3.5   Sodium (mmol/L)  Date Value  05/21/2022 136  11/21/2021 142   Corrected Calcium: 9.26 mg/dL  Assessment: 57 yo male with severe atherosclerotic changes of bilateral lower extremities with rest pain with associated with pre ulcerative changes and impending tissue loss of both lower extremities. He presented to [facility] on 03/12 for a bilateral angio with thrombolysis and bilateral peripheral vascular thrombectomy. Postop pt admitted to ICU for thrombolytic infusion and heparin gtt overnight in preparation for repeat arteriogram on 03/13. PCCM has consulted to monitor and replace electrolytes.  Goal of Therapy:  Electrolytes WNL  Plan:  ---no electrolyte replacement warranted for today ---Will f/u with tomorrow with AM labs  Dallie Piles ,PharmD Clinical Pharmacist 05/21/2022 8:24 AM

## 2022-05-21 NOTE — Discharge Summary (Signed)
[facility]    Discharge Summary    Patient ID:  [name] MRN: [id number] DOB/AGE: 10-Jul-1965 57 y.o.  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)    Right inguinal hernia     Procedure(s): LOWER EXTREMITY INTERVENTION  Discharged Condition: good  HPI:  [name] is a 57 y.o. male who was admitted to ICU status post bilateral angiograms with thrombolysis and bilateral peripheral vascular mechanical thrombectomies.  On 05/18/2022 the patient was examined at the bedside.  He had a large amount of bleeding from his left groin.  Patient is H&H was noted to be 7.  Patient was given 2 units of packed red blood cells.  Patient has done well since then and his hemoglobin and hematocrit have increased daily.  Further bleeding from his groins to note.  On 05/20/2022 the patient was ambulating with PT and ambulated throughout the unit with the nursing staff.  He denies any chest pain shortness of breath dizziness or loss of vision.  Patient was transitioned from heparin infusion over the weekend to oral anticoagulants.  Patient was placed on aspirin 81 mg daily and Eliquis 5 mg twice daily.  On 05/21/2022 the patient had no complaints overnight.  Vitals all remained stable.  Lower extremities are warm to the  touch with palpable pulses.  Patient had no complaints of pain.  Bilateral groins were checked again.  There is no signs or symptoms of hematoma, seroma or infection.  Patient requested to go home.  Patient will be discharged later today.  Hospital Course:  [name] is a 57 y.o. male is S/P Bilateral Extubated: POD # 0 Physical Exam:  Alert notes x3, no acute distress Face: Symmetrical.  Tongue is midline. Neck: Trachea is midline.  No swelling or bruising. Cardiovascular: Regular rate and rhythm Pulmonary: Clear to auscultation bilaterally Abdomen: Soft, nontender, nondistended Right groin access: Clean dry and intact.  No swelling or drainage noted Left groin access: Clean dry and intact.  No swelling or drainage noted Left lower extremity: Thigh soft.  Calf soft.  Extremities warm distally toes.  Hard to palpate pedal pulses however the foot is warm is her good capillary refill. Right lower extremity: Thigh soft.  Calf soft.  Extremities warm distally toes.  Hard to palpate pedal pulses however the foot is warm is her good capillary refill. Neurological: No deficits noted   Post-op wounds:  clean, dry, intact or healing well  Pt. Ambulating, voiding and taking PO diet without difficulty. Pt pain controlled with PO pain meds.  Labs:  As below  Complications: none  Consults:    Significant Diagnostic Studies: CBC Lab Results  Component Value Date   WBC 7.2 05/21/2022   HGB 10.4 (L) 05/21/2022   HCT 29.9 (L) 05/21/2022   MCV 84.0 05/21/2022   PLT 139 (L) 05/21/2022    BMET  Component Value Date/Time   NA 136 05/21/2022 0511   NA 142 11/21/2021 1035   K 3.8 05/21/2022 0511   CL 104 05/21/2022 0511   CO2 23 05/21/2022 0511   GLUCOSE 95 05/21/2022 0511   BUN 13 05/21/2022 0511   BUN 16 11/21/2021 1035   CREATININE 0.62 05/21/2022 0511   CALCIUM 9.0 05/21/2022 0511   GFRNONAA >60 05/21/2022 0511   GFRAA >60 05/27/2017 0454   COAG Lab Results   Component Value Date   INR 1.3 (H) 05/17/2022   INR 1.04 05/21/2017     Disposition:  Discharge to :Home  Allergies as of 05/21/2022       Reactions   Versed [midazolam] Rash   raised  red whelps and burning right iv site after recieved fentanyl and versed.  Second procedure pt recieved  IV solumedrol IV benedryl and pepcid po and tolerated both.    Fentanyl Hives   raised red whelps and burning right iv site after recieved fentanyl and versed Second procedure pt recieved  IV solumedrol IV benedryl and pepcid po and tolerated both.         Medication List     STOP taking these medications    ibuprofen 800 MG tablet Commonly known as: ADVIL       TAKE these medications    acetaminophen 325 MG tablet Commonly known as: TYLENOL Take 650 mg by mouth daily as needed for moderate pain.   apixaban 5 MG Tabs tablet Commonly known as: ELIQUIS Take 1 tablet (5 mg total) by mouth 2 (two) times daily.   aspirin EC 81 MG tablet Take 81 mg by mouth daily. Swallow whole.   HYDROcodone-acetaminophen 5-325 MG tablet Commonly known as: NORCO/VICODIN Take 1 tablet by mouth every 6 (six) hours as needed for severe pain. Must last 30 days.   oxyCODONE 5 MG immediate release tablet Commonly known as: Oxy IR/ROXICODONE Take 1 tablet (5 mg total) by mouth every 12 (twelve) hours as needed for moderate pain.   propranolol 10 MG tablet Commonly known as: INDERAL Take 10 mg by mouth 3 (three) times daily.   rosuvastatin 20 MG tablet Commonly known as: CRESTOR Take 20 mg by mouth daily.       Verbal and written Discharge instructions given to the patient. Wound care per Discharge AVS  Follow-up Information     Schnier, Dolores Lory, MD Follow up in 2 week(s).   Specialties: Vascular Surgery, Cardiology, Radiology, Vascular Surgery Why: F/U with Dr Delana Meyer with aorta iliac duplex ultrasounds. Contact information: [place]  [phone]                 Signed: Drema Pry, NP  05/21/2022, 11:37 AM

## 2022-05-23 LAB — CULTURE, BLOOD (ROUTINE X 2): Culture: NO GROWTH

## 2022-06-05 ENCOUNTER — Ambulatory Visit
Payer: Medicaid Other | Attending: Student in an Organized Health Care Education/Training Program | Admitting: Student in an Organized Health Care Education/Training Program

## 2022-06-05 ENCOUNTER — Other Ambulatory Visit (INDEPENDENT_AMBULATORY_CARE_PROVIDER_SITE_OTHER): Payer: Self-pay | Admitting: Vascular Surgery

## 2022-06-05 ENCOUNTER — Encounter: Payer: Self-pay | Admitting: Student in an Organized Health Care Education/Training Program

## 2022-06-05 VITALS — BP 91/70 | HR 73 | Temp 97.3°F | Resp 17 | Ht 65.0 in | Wt 168.0 lb

## 2022-06-05 DIAGNOSIS — M5416 Radiculopathy, lumbar region: Secondary | ICD-10-CM | POA: Diagnosis not present

## 2022-06-05 DIAGNOSIS — G894 Chronic pain syndrome: Secondary | ICD-10-CM

## 2022-06-05 DIAGNOSIS — I70219 Atherosclerosis of native arteries of extremities with intermittent claudication, unspecified extremity: Secondary | ICD-10-CM

## 2022-06-05 DIAGNOSIS — M792 Neuralgia and neuritis, unspecified: Secondary | ICD-10-CM | POA: Diagnosis present

## 2022-06-05 DIAGNOSIS — I739 Peripheral vascular disease, unspecified: Secondary | ICD-10-CM

## 2022-06-05 DIAGNOSIS — Z79891 Long term (current) use of opiate analgesic: Secondary | ICD-10-CM

## 2022-06-05 DIAGNOSIS — Z95820 Peripheral vascular angioplasty status with implants and grafts: Secondary | ICD-10-CM

## 2022-06-05 DIAGNOSIS — I70323 Atherosclerosis of unspecified type of bypass graft(s) of the extremities with rest pain, bilateral legs: Secondary | ICD-10-CM

## 2022-06-05 DIAGNOSIS — I70213 Atherosclerosis of native arteries of extremities with intermittent claudication, bilateral legs: Secondary | ICD-10-CM | POA: Diagnosis not present

## 2022-06-05 MED ORDER — HYDROCODONE-ACETAMINOPHEN 5-325 MG PO TABS: 1.0000 | ORAL_TABLET | Freq: Four times a day (QID) | ORAL | 0 refills | Status: AC | PRN

## 2022-06-05 NOTE — Progress Notes (Signed)
PROVIDER NOTE: Information contained herein reflects review and annotations entered in association with encounter. Interpretation of such information and data should be left to medically-trained personnel. Information provided to patient can be located elsewhere in the medical record under "Patient Instructions". Document created using STT-dictation technology, any transcriptional errors that may result from process are unintentional.    Patient: [name]  Service Category: E/M  Provider: Gillis Santa, MD  DOB: 1965-03-30  DOS: 06/05/2022  Referring Provider: Casilda Carls, MD  MRN: [id number]  Specialty: Interventional Pain Management  PCP: Casilda Carls, MD  Type: Established Patient  Setting: Ambulatory outpatient    Location: Office  Delivery: Face-to-face     HPI  [name], a 57 y.o. year old male, is here today because of his Chronic pain syndrome [G89.4]. [name] primary complain today is Back Pain (lower) Last encounter: My last encounter with him was on 01/04/2022 Pertinent problems: [name] has Chronic distal aortic occlusion; Neuropathic pain; Atherosclerosis of artery of extremity with rest pain; Pain management contract signed; Encounter for long-term opiate analgesic use; and Chronic pain syndrome on their pertinent problem list. Pain Assessment: Severity of Chronic pain is reported as a 5 /10. Location: Back Lower, Right, Left/down left leg to great toe; through right hip to right thigh. Onset: More than a month ago. Quality: Constant, Pressure. Timing: Constant. Modifying factor(s): meds. Vitals:  height is 5\' 5"  (1.651 m) and weight is 168 lb (76.2 kg). His temporal temperature is 97.3 F (36.3 C) (abnormal). His blood pressure is 91/70 and his pulse is 73. His respiration is 17 and oxygen saturation is 100%.   Reason for encounter: both, medication management and post-procedure evaluation and assessment.    -benefit from C-ESI as below,  states that he is noticing improvement right arm pain and improvement in ROM -had lower extremity vascular surgery with Dr Delana Meyer (stent placed and fem-fem bypass given occluded graft) -patient continues to have left leg pain and numbness, hopeful that recovery from his revascularization will improve that -also discussed left Lumbar ESI as a component of his radiating left leg pain could be radicular as well, patient would like to hold off until he recovers from his vascular surgery -has follow up appointment with him on 4/10  Post-procedure evaluation   Type: Cervical Epidural Steroid injection (ESI) (Interlaminar) #1  Laterality: Right  Level: C7-T1 Imaging: Fluoroscopy-assisted DOS: 03/19/2022  Performed by: Gillis Santa, MD Anesthesia: Local anesthesia (1-2% Lidocaine)  Purpose: Diagnostic/Therapeutic Indications: Cervicalgia, cervical radicular pain, degenerative disc disease, severe enough to impact quality of life or function. 1. Cervical radicular pain   2. Neuropathic pain   3. Chronic pain syndrome    NAS-11 score:   Pre-procedure: 8 /10   Post-procedure: 6 /10      Effectiveness:  Initial hour after procedure: 100 %  Subsequent 4-6 hours post-procedure: 100 %  Analgesia past initial 6 hours: 50 % (now able to lift right arm)  Ongoing improvement:  Analgesic:  50% Function: [name] reports improvement in function ROM: [name] reports improvement in ROM   Pharmacotherapy Assessment  Analgesic: Hydrocodone 5 mg every 6 hours as needed, quantity 120/month, MME equals 20  Monitoring: [place] PMP: PDMP reviewed during this encounter.       Pharmacotherapy: No side-effects or adverse reactions reported. Compliance: No problems identified. Effectiveness: Clinically acceptable.  Rise Patience, RN  06/05/2022 10:33 AM  Sign when Signing Visit Nursing Pain Medication Assessment:  Safety precautions to be maintained throughout the  outpatient stay will include:  orient to surroundings, keep bed in [name] position, maintain call bell within reach at all times, provide assistance with transfer out of bed and ambulation.  Medication Inspection Compliance: [name] did not comply with our request to bring his pills to be counted. He was reminded that bringing the medication bottles, even when empty, is a requirement.  Medication: None brought in. Pill/Patch Count: None available to be counted. Bottle Appearance: No container available. Did not bring bottle(s) to appointment. Filled Date: N/A Last Medication intake:  Today  Rise Patience, RN  06/05/2022 10:33 AM  Sign when Signing Visit Safety precautions to be maintained throughout the outpatient stay will include: orient to surroundings, keep bed in [name] position, maintain call bell within reach at all times, provide assistance with transfer out of bed and ambulation.     No results found for: "CBDTHCR" No results found for: "D8THCCBX" No results found for: "D9THCCBX"  UDS:  Summary  Date Value Ref Range Status  11/21/2021 Note  Final    Comment:    ==================================================================== Compliance Drug Analysis, Ur ==================================================================== Test                             Result       Flag       Units  Drug Present and Declared for Prescription Verification   Acetaminophen                  PRESENT      EXPECTED   Propranolol                    PRESENT      EXPECTED  Drug Present not Declared for Prescription Verification   Gabapentin                     PRESENT      UNEXPECTED  Drug Absent but Declared for Prescription Verification   Pregabalin                     Not Detected UNEXPECTED   Salicylate                     Not Detected UNEXPECTED    Aspirin, as indicated in the declared medication list, is not always    detected even when used as directed.    Ibuprofen                      Not Detected UNEXPECTED     Ibuprofen, as indicated in the declared medication list, is not    always detected even when used as directed.  ==================================================================== Test                      Result    Flag   Units      Ref Range   Creatinine              120              mg/dL      >=20 ==================================================================== Declared Medications:  The flagging and interpretation on this report are based on the  following declared medications.  Unexpected results may arise from  inaccuracies in the declared medications.   **Note: The testing scope of this panel includes these medications:   Pregabalin (Lyrica)  Propranolol (Inderal)   **Note:  The testing scope of this panel does not include small to  moderate amounts of these reported medications:   Acetaminophen (Tylenol)  Aspirin  Ibuprofen (Advil)   **Note: The testing scope of this panel does not include the  following reported medications:   Rosuvastatin (Crestor) ==================================================================== For clinical consultation, please call [phone]. ====================================================================       ROS  Constitutional: Denies any fever or chills Gastrointestinal: No reported hemesis, hematochezia, vomiting, or acute GI distress Musculoskeletal: left leg pain and numbness Neurological: No reported episodes of acute onset apraxia, aphasia, dysarthria, agnosia, amnesia, paralysis, loss of coordination, or loss of consciousness  Medication Review  HYDROcodone-acetaminophen, apixaban, aspirin EC, propranolol, and rosuvastatin  History Review  Allergy: [name] is allergic to versed [midazolam] and fentanyl. Drug: [name]  reports no history of drug use. Alcohol:  reports no history of alcohol use. Tobacco:  reports that he has quit smoking. His smoking use included cigarettes. He has a 7.50  pack-year smoking history. He has been exposed to tobacco smoke. He has never used smokeless tobacco. Social: [name]  reports that he has quit smoking. His smoking use included cigarettes. He has a 7.50 pack-year smoking history. He has been exposed to tobacco smoke. He has never used smokeless tobacco. He reports that he does not drink alcohol and does not use drugs. Medical:  has a past medical history of Carpal tunnel syndrome of left wrist, DJD (degenerative joint disease), History of kidney stones, Hyperlipidemia, Leg pain, [name] (04/04/2017), PAD (peripheral artery disease), Peripheral vascular disease, and Right inguinal hernia. Surgical: [name]  has a past surgical history that includes Knee surgery (Left, 2006); Colonoscopy with propofol (N/A, 03/27/2017); Kidney stone surgery; Aorta - bilateral femoral artery bypass graft (N/A, 05/22/2017); Central venous catheter insertion (Left, 05/22/2017); Robotic assisted laparoscopic lysis of adhesion (N/A, 06/28/2021); Insertion of mesh (Right, 06/28/2021); Dupuytren contracture release (Left, 01/17/2022); LOWER EXTREMITY INTERVENTION (Bilateral, 05/15/2022); LOWER EXTREMITY INTERVENTION (Bilateral, 05/16/2022); and LOWER EXTREMITY INTERVENTION (Bilateral, 05/17/2022). Family: family history includes Alzheimer's disease in his father; Diabetes in his mother; Heart disease in his mother.  Laboratory Chemistry Profile   Renal Lab Results  Component Value Date   BUN 13 05/21/2022   CREATININE 0.62 05/21/2022   BCR 19 11/21/2021   GFRAA >60 05/27/2017   GFRNONAA >60 05/21/2022    Hepatic Lab Results  Component Value Date   AST 39 05/16/2022   ALT 35 05/16/2022   ALBUMIN 2.8 (L) 05/19/2022   ALKPHOS 62 05/16/2022    Electrolytes Lab Results  Component Value Date   NA 136 05/21/2022   K 3.8 05/21/2022   CL 104 05/21/2022   CALCIUM 9.0 05/21/2022   MG 2.1 05/21/2022   PHOS 3.5 05/21/2022    Bone Lab Results   Component Value Date   25OHVITD1 34 11/21/2021   25OHVITD2 <1.0 11/21/2021   25OHVITD3 34 11/21/2021    Inflammation (CRP: Acute Phase) (ESR: Chronic Phase) No results found for: "CRP", "ESRSEDRATE", "LATICACIDVEN"       Note: Above Lab results reviewed.  Recent Imaging Review  PERIPHERAL VASCULAR CATHETERIZATION See surgical note for result.  CLINICAL DATA:  Left neck pain radiating down the left arm with weakness.   EXAM: MRI CERVICAL SPINE WITHOUT CONTRAST   TECHNIQUE: Multiplanar, multisequence MR imaging of the cervical spine was performed. No intravenous contrast was administered.   COMPARISON:  Cervical spine radiographs 06/13/2021   FINDINGS: Alignment: Normal.   Vertebrae: No fracture, suspicious marrow lesion, or  significant marrow edema.   Cord: Normal signal.   Posterior Fossa, vertebral arteries, paraspinal tissues: Unremarkable.   Disc levels:   C2-3: Negative.   C3-4: A small central disc protrusion in moderate spinal stenosis, mildly indenting the ventral spinal cord. There is mild disc bulging and uncovertebral spurring without significant neural foraminal stenosis.   C4-5: Disc bulging and uncovertebral spurring result in mild spinal stenosis and mild right neural foraminal stenosis.   C5-6: Moderate disc space narrowing. Disc bulging and uncovertebral spurring result in mild spinal stenosis and moderate right and mild-to-moderate left neural foraminal stenosis.   C6-7: A small central disc protrusion slightly indents the ventral spinal cord without significant spinal stenosis. Disc bulging, uncovertebral spurring, and a left foraminal disc protrusion result in moderate left neural foraminal stenosis with potential left C7 nerve root impingement.   C7-T1: Mild facet arthrosis without stenosis.   IMPRESSION: 1. Left foraminal disc protrusion at C6-7 with moderate left neural foraminal stenosis and potential left C7 nerve root  impingement. 2. Moderate spinal stenosis at C3-4. 3. Mild spinal stenosis at C4-5 and C5-6.      Note: Reviewed        Physical Exam  General appearance: Well nourished, well developed, and well hydrated. In no apparent acute distress Mental status: Alert, oriented x 3 (person, place, & time)       Respiratory: No evidence of acute respiratory distress Eyes: PERLA Vitals: BP 91/70   Pulse 73   Temp (!) 97.3 F (36.3 C) (Temporal)   Resp 17   Ht 5\' 5"  (1.651 m)   Wt 168 lb (76.2 kg)   SpO2 100%   BMI 27.96 kg/m  BMI: Estimated body mass index is 27.96 kg/m as calculated from the following:   Height as of this encounter: 5\' 5"  (1.651 m).   Weight as of this encounter: 168 lb (76.2 kg). Ideal: Ideal body weight: 61.5 kg (135 lb 9.3 oz) Adjusted ideal body weight: 67.4 kg (148 lb 8.8 oz)  Cervical Spine Area Exam  Skin & Axial Inspection: No masses, redness, edema, swelling, or associated skin lesions Alignment: Symmetrical Functional ROM: Improved after treatment, to the left Stability: No instability detected Muscle Tone/Strength: Functionally intact. No obvious neuro-muscular anomalies detected. Sensory (Neurological): Dermatomal pain pattern improved  Palpation: No palpable anomalies             Upper Extremity (UE) Exam    Side: Right upper extremity  Side: Left upper extremity  Skin & Extremity Inspection: Skin color, temperature, and hair growth are WNL. No peripheral edema or cyanosis. No masses, redness, swelling, asymmetry, or associated skin lesions. No contractures.  Skin & Extremity Inspection: Skin color, temperature, and hair growth are WNL. No peripheral edema or cyanosis. No masses, redness, swelling, asymmetry, or associated skin lesions. No contractures.  Functional ROM: Pain restricted ROM for shoulder and elbow  Functional ROM: Unrestricted ROM          Muscle Tone/Strength: Functionally intact. No obvious neuro-muscular anomalies detected.  Muscle  Tone/Strength: Functionally intact. No obvious neuro-muscular anomalies detected.  Sensory (Neurological): Unimpaired          Sensory (Neurological): Unimpaired          Palpation: No palpable anomalies              Palpation: No palpable anomalies              Provocative Test(s):  Phalen's test: deferred Tinel's test: deferred Apley's scratch test (touch opposite  shoulder):  Action 1 (Across chest): Decreased ROM Action 2 (Overhead): Decreased ROM Action 3 (LB reach): Decreased ROM   Provocative Test(s):  Phalen's test: deferred Tinel's test: deferred Apley's scratch test (touch opposite shoulder):  Action 1 (Across chest): deferred Action 2 (Overhead): deferred Action 3 (LB reach): deferred      Lumbar Spine Area Exam  Skin & Axial Inspection: No masses, redness, or swelling Alignment: Symmetrical Functional ROM: Pain restricted ROM       Stability: No instability detected Muscle Tone/Strength: Functionally intact. No obvious neuro-muscular anomalies detected. Sensory (Neurological): dermatomal pain pattern left leg   Gait & Posture Assessment  Ambulation: Unassisted Gait: Relatively normal for age and body habitus Posture: WNL    Lower Extremity Exam      Side: Right lower extremity   Side: Left lower extremity  Stability: No instability observed           Stability: No instability observed          Skin & Extremity Inspection: Skin color, temperature, and hair growth are WNL. No peripheral edema or cyanosis. No masses, redness, swelling, asymmetry, or associated skin lesions. No contractures.   Skin & Extremity Inspection: Skin color, temperature, and hair growth are WNL. No peripheral edema or cyanosis. No masses, redness, swelling, asymmetry, or associated skin lesions. No contractures.  Functional ROM: Unrestricted ROM                   Functional ROM: Unrestricted ROM                  Muscle Tone/Strength: Functionally intact. No obvious neuro-muscular anomalies  detected.   Muscle Tone/Strength: Functionally intact. No obvious neuro-muscular anomalies detected.  Sensory (Neurological): Neurogenic pain pattern         Sensory (Neurological): Neurogenic pain pattern        DTR: Patellar: deferred today Achilles: deferred today Plantar: deferred today   DTR: Patellar: deferred today Achilles: deferred today Plantar: deferred today  Palpation: No palpable anomalies   Palpation: No palpable anomalies    5 out of 5 strength bilateral lower extremity: Plantar flexion, dorsiflexion, knee flexion, knee extension.   Assessment   Diagnosis Status  1. Chronic pain syndrome   2. Neuropathic pain   3. PAD (peripheral artery disease)   4. Atherosclerosis of native artery of both lower extremities with intermittent claudication   5. Lumbar radicular pain   6. Encounter for long-term opiate analgesic use   7. Atherosclerotic peripheral vascular disease with intermittent claudication      Persistent Persistent Persistent     Plan of Care    [name] has a current medication list which includes the following long-term medication(s): apixaban, propranolol, and rosuvastatin.  Pharmacotherapy (Medications Ordered): Meds ordered this encounter  Medications   HYDROcodone-acetaminophen (NORCO/VICODIN) 5-325 MG tablet    Sig: Take 1 tablet by mouth every 6 (six) hours as needed for severe pain. Must last 30 days.    Dispense:  120 tablet    Refill:  0    Chronic Pain: STOP Act (Not applicable) Fill 1 day early if closed on refill date. Avoid benzodiazepines within 8 hours of opioids   HYDROcodone-acetaminophen (NORCO/VICODIN) 5-325 MG tablet    Sig: Take 1 tablet by mouth every 6 (six) hours as needed for severe pain. Must last 30 days.    Dispense:  120 tablet    Refill:  0    Chronic Pain: STOP Act (Not applicable) Fill 1  day early if closed on refill date. Avoid benzodiazepines within 8 hours of opioids   HYDROcodone-acetaminophen  (NORCO/VICODIN) 5-325 MG tablet    Sig: Take 1 tablet by mouth every 6 (six) hours as needed for severe pain. Must last 30 days.    Dispense:  120 tablet    Refill:  0    Chronic Pain: STOP Act (Not applicable) Fill 1 day early if closed on refill date. Avoid benzodiazepines within 8 hours of opioids  Dicussed left L-ESI for left radicular pain Good relief from C-ESI, repeat PRN  Orders:  No orders of the defined types were placed in this encounter.  Follow-up plan:   Return in about 3 months (around 09/04/2022) for Medication Management.    Recent Visits Date Type Provider Dept  03/19/22 Procedure visit Gillis Santa, MD [facility]  Showing recent visits within past 90 days and meeting all other requirements Today's Visits Date Type Provider Dept  06/05/22 Office Visit Gillis Santa, MD [facility]  Showing today's visits and meeting all other requirements Future Appointments No visits were found meeting these conditions. Showing future appointments within next 90 days and meeting all other requirements  I discussed the assessment and treatment plan with the patient. The patient was provided an opportunity to ask questions and all were answered. The patient agreed with the plan and demonstrated an understanding of the instructions.  Patient advised to call back or seek an in-person evaluation if the symptoms or condition worsens.  Duration of encounter: 73minutes.  Total time on encounter, as per AMA guidelines included both the face-to-face and non-face-to-face time personally spent by the physician and/or other qualified health care professional(s) on the day of the encounter (includes time in activities that require the physician or other qualified health care professional and does not include time in activities normally performed by clinical staff). Physician's time may include the following activities when performed: preparing to see the patient (eg, review of  tests, pre-charting review of records) obtaining and/or reviewing separately obtained history performing a medically appropriate examination and/or evaluation counseling and educating the patient/family/caregiver ordering medications, tests, or procedures referring and communicating with other health care professionals (when not separately reported) documenting clinical information in the electronic or other health record independently interpreting results (not separately reported) and communicating results to the patient/ family/caregiver care coordination (not separately reported)  Note by: Gillis Santa, MD Date: 06/05/2022; Time: 11:08 AM

## 2022-06-05 NOTE — Progress Notes (Signed)
Safety precautions to be maintained throughout the outpatient stay will include: orient to surroundings, keep bed in low position, maintain call bell within reach at all times, provide assistance with transfer out of bed and ambulation.  

## 2022-06-05 NOTE — Progress Notes (Signed)
Nursing Pain Medication Assessment:  Safety precautions to be maintained throughout the outpatient stay will include: orient to surroundings, keep bed in low position, maintain call bell within reach at all times, provide assistance with transfer out of bed and ambulation.  Medication Inspection Compliance: [name] did not comply with our request to bring his pills to be counted. He was reminded that bringing the medication bottles, even when empty, is a requirement.  Medication: None brought in. Pill/Patch Count: None available to be counted. Bottle Appearance: No container available. Did not bring bottle(s) to appointment. Filled Date: N/A Last Medication intake:  Today 

## 2022-06-13 ENCOUNTER — Ambulatory Visit (INDEPENDENT_AMBULATORY_CARE_PROVIDER_SITE_OTHER): Payer: Medicaid Other | Admitting: Nurse Practitioner

## 2022-06-13 ENCOUNTER — Ambulatory Visit (INDEPENDENT_AMBULATORY_CARE_PROVIDER_SITE_OTHER): Payer: Medicaid Other

## 2022-06-13 DIAGNOSIS — Z95820 Peripheral vascular angioplasty status with implants and grafts: Secondary | ICD-10-CM

## 2022-06-13 DIAGNOSIS — I70323 Atherosclerosis of unspecified type of bypass graft(s) of the extremities with rest pain, bilateral legs: Secondary | ICD-10-CM

## 2022-06-13 NOTE — Progress Notes (Signed)
Patient ID: [name], male   DOB: [birth year], 57 y.o.   MRN: [id number]  No chief complaint on file.   HPI [name] is a 57 y.o. male.  ***   Past Medical History:  Diagnosis Date   Carpal tunnel syndrome of left wrist    DJD (degenerative joint disease)    History of kidney stones    Hyperlipidemia    Leg pain    Low back pain 04/04/2017   PAD (peripheral artery disease)    Peripheral vascular disease    Right inguinal hernia     Past Surgical History:  Procedure Laterality Date   AORTA - BILATERAL FEMORAL ARTERY BYPASS GRAFT N/A 05/22/2017   Procedure: AORTA BIFEMORAL BYPASS GRAFT, Aortic bilateral, and internal ischemia;  Surgeon: Renford Dills, MD;  Location: [facility];  Service: Vascular;  Laterality: N/A;   CENTRAL VENOUS CATHETER INSERTION Left 05/22/2017   Procedure: INSERTION CENTRAL LINE ADULT Left Internal Jugular;  Surgeon: Renford Dills, MD;  Location: [facility];  Service: Vascular;  Laterality: Left;   COLONOSCOPY WITH PROPOFOL N/A 03/27/2017   Procedure: COLONOSCOPY WITH PROPOFOL;  Surgeon: Earline Mayotte, MD;  Location: [facility];  Service: Endoscopy;  Laterality: N/A;   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   Procedure: ENDOSCOPIC LEFT CARPAL TUNNEL RELEASE AND 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.;  Surgeon: Christena Flake, MD;  Location: [facility];  Service: Orthopedics;  Laterality: Left;   INSERTION OF MESH Right 06/28/2021   Procedure: INSERTION OF MESH;  Surgeon: Campbell Lerner, MD;  Location: [facility];  Service: General;  Laterality: Right;   KIDNEY STONE SURGERY     KNEE SURGERY Left 2006   LOWER EXTREMITY INTERVENTION Bilateral 05/15/2022   Procedure: LOWER EXTREMITY INTERVENTION;  Surgeon: Renford Dills, MD;  Location: [facility];  Service: Cardiovascular;  Laterality: Bilateral;   LOWER EXTREMITY INTERVENTION Bilateral 05/16/2022   Procedure: LOWER EXTREMITY  INTERVENTION;  Surgeon: Renford Dills, MD;  Location: [facility];  Service: Cardiovascular;  Laterality: Bilateral;   LOWER EXTREMITY INTERVENTION Bilateral 05/17/2022   Procedure: LOWER EXTREMITY INTERVENTION;  Surgeon: Renford Dills, MD;  Location: [facility];  Service: Cardiovascular;  Laterality: Bilateral;   ROBOTIC ASSISTED LAPAROSCOPIC LYSIS OF ADHESION N/A 06/28/2021   Procedure: XI ROBOTIC ASSISTED LAPAROSCOPIC LYSIS OF ADHESION;  Surgeon: Campbell Lerner, MD;  Location: [facility];  Service: General;  Laterality: N/A;      Allergies  Allergen Reactions   Versed [Midazolam] Rash    raised  red whelps and burning right iv site after recieved fentanyl and versed.  Second procedure pt recieved  IV solumedrol IV benedryl and pepcid po and tolerated both.    Fentanyl Hives    raised red whelps and burning right iv site after recieved fentanyl and versed Second procedure pt recieved  IV solumedrol IV benedryl and pepcid po and tolerated both.      Current Outpatient Medications  Medication Sig Dispense Refill   apixaban (ELIQUIS) 5 MG TABS tablet Take 1 tablet (5 mg total) by mouth 2 (two) times daily. 60 tablet 6   aspirin EC 81 MG tablet Take 81 mg by mouth daily. Swallow whole.     HYDROcodone-acetaminophen (NORCO/VICODIN) 5-325 MG tablet Take 1 tablet by mouth every 6 (six) hours as needed for severe pain. Must last 30 days. 120 tablet 0   [START ON 07/05/2022] HYDROcodone-acetaminophen (NORCO/VICODIN) 5-325 MG tablet Take 1 tablet by  mouth every 6 (six) hours as needed for severe pain. Must last 30 days. 120 tablet 0   [START ON 08/04/2022] HYDROcodone-acetaminophen (NORCO/VICODIN) 5-325 MG tablet Take 1 tablet by mouth every 6 (six) hours as needed for severe pain. Must last 30 days. 120 tablet 0   propranolol (INDERAL) 10 MG tablet Take 10 mg by mouth 3 (three) times daily.     rosuvastatin (CRESTOR) 20 MG tablet Take 20 mg by mouth daily.     No  current facility-administered medications for this visit.        Physical Exam There were no vitals taken for this visit. Gen:  WD/WN, NAD Skin: incision C/D/I     Assessment/Plan:  No problem-specific Assessment & Plan notes found for this encounter.      Levora Dredge 06/13/2022, 11:48 AM   This note was created with Dragon medical transcription system.  Any errors from dictation are unintentional.

## 2022-06-13 NOTE — H&P (View-Only) (Signed)
  Patient ID: [name], male   DOB: 12/20/1965, 57 y.o.   MRN: [id number]  No chief complaint on file.   HPI [name] is a 57 y.o. male.  ***   Past Medical History:  Diagnosis Date   Carpal tunnel syndrome of left wrist    DJD (degenerative joint disease)    History of kidney stones    Hyperlipidemia    Leg pain    Low back pain 04/04/2017   PAD (peripheral artery disease)    Peripheral vascular disease    Right inguinal hernia     Past Surgical History:  Procedure Laterality Date   AORTA - BILATERAL FEMORAL ARTERY BYPASS GRAFT N/A 05/22/2017   Procedure: AORTA BIFEMORAL BYPASS GRAFT, Aortic bilateral, and internal ischemia;  Surgeon: Elnor Renovato G, MD;  Location: [facility];  Service: Vascular;  Laterality: N/A;   CENTRAL VENOUS CATHETER INSERTION Left 05/22/2017   Procedure: INSERTION CENTRAL LINE ADULT Left Internal Jugular;  Surgeon: Drewey Begue G, MD;  Location: [facility];  Service: Vascular;  Laterality: Left;   COLONOSCOPY WITH PROPOFOL N/A 03/27/2017   Procedure: COLONOSCOPY WITH PROPOFOL;  Surgeon: Byrnett, Jeffrey W, MD;  Location: [facility];  Service: Endoscopy;  Laterality: N/A;   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   Procedure: ENDOSCOPIC LEFT CARPAL TUNNEL RELEASE AND 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.;  Surgeon: Poggi, John J, MD;  Location: [facility];  Service: Orthopedics;  Laterality: Left;   INSERTION OF MESH Right 06/28/2021   Procedure: INSERTION OF MESH;  Surgeon: Rodenberg, Denny, MD;  Location: [facility];  Service: General;  Laterality: Right;   KIDNEY STONE SURGERY     KNEE SURGERY Left 2006   LOWER EXTREMITY INTERVENTION Bilateral 05/15/2022   Procedure: LOWER EXTREMITY INTERVENTION;  Surgeon: Eli Adami G, MD;  Location: [facility];  Service: Cardiovascular;  Laterality: Bilateral;   LOWER EXTREMITY INTERVENTION Bilateral 05/16/2022   Procedure: LOWER EXTREMITY  INTERVENTION;  Surgeon: Cathern Tahir G, MD;  Location: [facility];  Service: Cardiovascular;  Laterality: Bilateral;   LOWER EXTREMITY INTERVENTION Bilateral 05/17/2022   Procedure: LOWER EXTREMITY INTERVENTION;  Surgeon: Modesta Sammons G, MD;  Location: [facility];  Service: Cardiovascular;  Laterality: Bilateral;   ROBOTIC ASSISTED LAPAROSCOPIC LYSIS OF ADHESION N/A 06/28/2021   Procedure: XI ROBOTIC ASSISTED LAPAROSCOPIC LYSIS OF ADHESION;  Surgeon: Rodenberg, Denny, MD;  Location: [facility];  Service: General;  Laterality: N/A;      Allergies  Allergen Reactions   Versed [Midazolam] Rash    raised  red whelps and burning right iv site after recieved fentanyl and versed.  Second procedure pt recieved  IV solumedrol IV benedryl and pepcid po and tolerated both.    Fentanyl Hives    raised red whelps and burning right iv site after recieved fentanyl and versed Second procedure pt recieved  IV solumedrol IV benedryl and pepcid po and tolerated both.      Current Outpatient Medications  Medication Sig Dispense Refill   apixaban (ELIQUIS) 5 MG TABS tablet Take 1 tablet (5 mg total) by mouth 2 (two) times daily. 60 tablet 6   aspirin EC 81 MG tablet Take 81 mg by mouth daily. Swallow whole.     HYDROcodone-acetaminophen (NORCO/VICODIN) 5-325 MG tablet Take 1 tablet by mouth every 6 (six) hours as needed for severe pain. Must last 30 days. 120 tablet 0   [START ON 07/05/2022] HYDROcodone-acetaminophen (NORCO/VICODIN) 5-325 MG tablet Take 1 tablet by   mouth every 6 (six) hours as needed for severe pain. Must last 30 days. 120 tablet 0   [START ON 08/04/2022] HYDROcodone-acetaminophen (NORCO/VICODIN) 5-325 MG tablet Take 1 tablet by mouth every 6 (six) hours as needed for severe pain. Must last 30 days. 120 tablet 0   propranolol (INDERAL) 10 MG tablet Take 10 mg by mouth 3 (three) times daily.     rosuvastatin (CRESTOR) 20 MG tablet Take 20 mg by mouth daily.     No  current facility-administered medications for this visit.        Physical Exam There were no vitals taken for this visit. Gen:  WD/WN, NAD Skin: incision C/D/I     Assessment/Plan:  No problem-specific Assessment & Plan notes found for this encounter.      [name] 06/13/2022, 11:48 AM   This note was created with Dragon medical transcription system.  Any errors from dictation are unintentional.     Rashes   Ulcers  Psychological:  History of anxiety    History of major depression.  Physical Examination  Vitals:   06/14/22 1104  BP: 107/74  Pulse: 98  Resp: 16  Weight: 159 lb 3.2 oz (72.2 kg)   Body mass index is 26.49 kg/m. Gen: WD/WN, NAD Head: [place]/AT, No temporalis wasting.  Ear/Nose/Throat: Hearing grossly intact, nares w/o erythema or drainage Eyes: PER, EOMI, sclera nonicteric.  Neck: Supple, no masses.  No bruit or JVD.  Pulmonary:  Good air movement, no audible wheezing, no use of accessory muscles.  Cardiac: RRR, normal S1, S2, no Murmurs. Vascular: Both feet are warm, no open wounds Vessel Right Left  Radial Palpable Palpable  PT  Palpable Not Palpable  DP  Palpable Trace palpable  Gastrointestinal: soft, non-distended. No guarding/no peritoneal signs.  Musculoskeletal: M/S 5/5 throughout.  No visible deformity.  Neurologic: CN 2-12 intact. Pain and light touch intact in extremities.  Symmetrical.  Speech is fluent. Motor exam as listed above. Psychiatric: Judgment intact, Mood & affect appropriate for pt's clinical  situation. Dermatologic: No rashes or ulcers noted.  No changes consistent with cellulitis.   CBC Lab Results  Component Value Date   WBC 7.2 05/21/2022   HGB 10.4 (L) 05/21/2022   HCT 29.9 (L) 05/21/2022   MCV 84.0 05/21/2022   PLT 139 (L) 05/21/2022    BMET    Component Value Date/Time   NA 136 05/21/2022 0511   NA 142 11/21/2021 1035   K 3.8 05/21/2022 0511   CL 104 05/21/2022 0511   CO2 23 05/21/2022 0511   GLUCOSE 95 05/21/2022 0511   BUN 13 05/21/2022 0511   BUN 16 11/21/2021 1035   CREATININE 0.62 05/21/2022 0511   CALCIUM 9.0 05/21/2022 0511   GFRNONAA >60 05/21/2022 0511   GFRAA >60 05/27/2017 0454   CrCl cannot be calculated (Patient's most recent lab result is older than the maximum 21 days allowed.).  COAG Lab Results  Component Value Date   INR 1.3 (H) 05/17/2022   INR 1.04 05/21/2017    Radiology VAS [place] ABI WITH/WO TBI  Result Date: 06/14/2022  LOWER EXTREMITY DOPPLER STUDY Patient Name:  ARBOR COHEN  Date of Exam:   06/13/2022 Medical Rec #: [id number]           Accession #:    [id number] Date of Birth: Oct 14, 1965           Patient Gender: M Patient Age:   35 years Exam Location:  [facility] Procedure:      VAS [place] ABI WITH/WO TBI Referring Phys: [facility] --------------------------------------------------------------------------------  Indications: Claudication, and peripheral artery disease. High Risk Factors: Hyperlipidemia, past history of smoking.  Vascular Interventions: 05/17/2022                         PTA and stent left external iliac, bilateral limbs of                         aorto-iliac bpg                          Surgery date 05/22/2017 Aortobiiliac bypass graft with                         reimplantation of the IMA into the bypass graft.  Bilateral CIA                         endarterectomies. Performing Technologist: Hardie LoraMatthew Cravey RVT  Examination Guidelines: A complete evaluation includes at  minimum, Doppler waveform signals and systolic blood pressure reading at the level of bilateral brachial, anterior tibial, and posterior tibial arteries, when vessel segments are accessible. Bilateral testing is considered an integral part of a complete examination. Photoelectric Plethysmograph (PPG) waveforms and toe systolic pressure readings are included as required and additional duplex testing as needed. Limited examinations for reoccurring indications may be performed as noted.  ABI Findings: +---------+------------------+-----+---------+--------+ Right    Rt Pressure (mmHg)IndexWaveform Comment  +---------+------------------+-----+---------+--------+ Brachial 119                                      +---------+------------------+-----+---------+--------+ PTA      128               1.08 triphasic         +---------+------------------+-----+---------+--------+ DP       127               1.07 triphasic         +---------+------------------+-----+---------+--------+ Great Toe105               0.88                   +---------+------------------+-----+---------+--------+ +---------+------------------+-----+----------+-------+ Left     Lt Pressure (mmHg)IndexWaveform  Comment +---------+------------------+-----+----------+-------+ Brachial 117                                      +---------+------------------+-----+----------+-------+ PTA      104               0.87 monophasic        +---------+------------------+-----+----------+-------+ DP       90                0.76 monophasic        +---------+------------------+-----+----------+-------+ Great Toe77                0.65                   +---------+------------------+-----+----------+-------+ +-------+-----------+-----------+------------+------------+ ABI/TBIToday's ABIToday's TBIPrevious ABIPrevious TBI +-------+-----------+-----------+------------+------------+ Right  1.08       0.88        0.53        0.37         +-------+-----------+-----------+------------+------------+ Left   0.87       0.65       0.49                     +-------+-----------+-----------+------------+------------+ Bilateral ABIs appear increased compared to prior study on 05/04/2022.  Summary: Right: Resting right ankle-brachial index is within normal range. The right toe-brachial index is abnormal. Left: Resting left ankle-brachial index indicates mild left lower extremity arterial disease. The left toe-brachial index is abnormal. *See table(s) above for measurements and observations.  Electronically signed by Levora DredgeGregory Naelle Diegel MD on 06/14/2022 at 11:19:01 AM.    Final    VAS US AORTA/IVC/ILIACS  Result Date: 06/14/2022 ABDOMINAL AORTA STUDY Patient Name:  Jacquenette ShoneFARZAD Rayman  Date of Exam:   06/13/2022 Medical Rec #: [id number]  Accession #:    [id number] Date of Birth: 8/[id number]/1967           Patient Gender: M Patient Age:   [age] years Exam Location:  [facility] Procedure:      VAS US AORTA/IVC/ILIACS Referring Phys: Levora DredgeGREGORY Shavonte Zhao --------------------------------------------------------------------------------  Risk Factors: Hyperlipidemia, past history of smoking. Vascular Interventions: 05/17/2022                         PTA and stent left external iliac, bilateral limbs of                         aorto-iliac bpg                          Surgery date 05/22/2017 Aortobiiliac bypass graft with                         reimplantation of the IMA into the bypass graft.                         Bilateral CIA                         endarterectomies.  Performing Technologist: Hardie LoraMatthew Cravey RVT  Examination Guidelines: A complete evaluation includes B-mode imaging, spectral Doppler, color Doppler, and power Doppler as needed of all accessible portions of each vessel. Bilateral testing is considered an integral part of a complete examination. Limited examinations for reoccurring indications may be performed as  noted.  Right Stent(s): +------------------------------------+--------+--------+---------+--------+ Right limb aorto- iliac bypass graftPSV cm/sStenosisWaveform Comments +------------------------------------+--------+--------+---------+--------+ Prox to Stent                       248             triphasic         +------------------------------------+--------+--------+---------+--------+ Proximal Stent                      194             triphasic         +------------------------------------+--------+--------+---------+--------+ Mid Stent                           222             triphasic         +------------------------------------+--------+--------+---------+--------+ Distal Stent                        186             triphasic         +------------------------------------+--------+--------+---------+--------+ Distal to Stent                     112             triphasic         +------------------------------------+--------+--------+---------+--------+   Left Stent(s): +--------------------------+--------+---------------+----------+--------+ Left limb Aorto -iliac bpgPSV cm/sStenosis       Waveform  Comments +--------------------------+--------+---------------+----------+--------+ Prox to Stent             176                                       +--------------------------+--------+---------------+----------+--------+  Proximal Stent            388     50-99% stenosismonophasic         +--------------------------+--------+---------------+----------+--------+ Mid Stent                 401     50-99% stenosismonophasic         +--------------------------+--------+---------------+----------+--------+ Distal Stent              203                    monophasic         +--------------------------+--------+---------------+----------+--------+  +-------------------+--------+--------+----------+--------+ Left external iliacPSV  cm/sStenosisWaveform  Comments +-------------------+--------+--------+----------+--------+ Prox to Stent      203             monophasic         +-------------------+--------+--------+----------+--------+ Proximal Stent     200             monophasic         +-------------------+--------+--------+----------+--------+ Mid Stent          53              monophasic         +-------------------+--------+--------+----------+--------+ Distal Stent       29              monophasic         +-------------------+--------+--------+----------+--------+ Distal to Stent    53              monophasic         +-------------------+--------+--------+----------+--------+   Summary: Stenosis: +--------------------+---------------+ Location            Stent           +--------------------+---------------+ Right Common Iliac  no stenosis     +--------------------+---------------+ Left Common Iliac   50-99% stenosis +--------------------+---------------+ Right External Iliacno stenosis     +--------------------+---------------+ In stent stenosis of the left limb of the aorto-iliac bypass graft.  *See table(s) above for measurements and observations.  Electronically signed by Levora Dredge MD on 06/14/2022 at 11:18:16 AM.    Final    PERIPHERAL VASCULAR CATHETERIZATION  Result Date: 05/17/2022 See surgical note for result.  PERIPHERAL VASCULAR CATHETERIZATION  Result Date: 05/16/2022 See surgical note for result.  PERIPHERAL VASCULAR CATHETERIZATION  Result Date: 05/15/2022 See surgical note for result.    Assessment/Plan 1. Atherosclerosis of artery of extremity with rest pain Recommend:  The patient has evidence of severe atherosclerotic changes of both lower extremities with rest pain that is associated with preulcerative changes and impending tissue loss of the left foot.  This represents a limb threatening ischemia and places the patient at the risk for left limb  loss.  The patient just recently went through multiple procedures to reestablish flow there were several bleeding complications secondary to a prolonged course of tPA which were not completely unexpected but speak to the degree of difficulty in revascularizing his aorta iliac system.  Given this finding I believe it is completely justified to correct the left limb defect what ever it appears to be in order to optimize his blood flow and return his ABI on the left to 1.0 if possible.  Patient should undergo angiography of the left lower extremity with the hope for intervention for limb salvage.  The risks and benefits as well as the alternative therapies was discussed in detail with the patient.  All questions were answered.  Patient agrees to proceed  with left lower extremity angiography.  The patient will follow up with me in the office after the procedure.     2. DJD (degenerative joint disease), lumbosacral Continue NSAID medications as already ordered, these medications have been reviewed and there are no changes at this time.  Continued activity and therapy was stressed.  3. Mixed hyperlipidemia Continue statin as ordered and reviewed, no changes at this time    Levora DredgeGregory Erine Phenix, MD  06/14/2022 1:41 PM

## 2022-06-14 ENCOUNTER — Encounter (INDEPENDENT_AMBULATORY_CARE_PROVIDER_SITE_OTHER): Payer: Self-pay | Admitting: Vascular Surgery

## 2022-06-14 ENCOUNTER — Ambulatory Visit (INDEPENDENT_AMBULATORY_CARE_PROVIDER_SITE_OTHER): Payer: Medicaid Other | Admitting: Vascular Surgery

## 2022-06-14 ENCOUNTER — Telehealth (INDEPENDENT_AMBULATORY_CARE_PROVIDER_SITE_OTHER): Payer: Self-pay

## 2022-06-14 VITALS — BP 107/74 | HR 98 | Resp 16 | Wt 159.2 lb

## 2022-06-14 DIAGNOSIS — E782 Mixed hyperlipidemia: Secondary | ICD-10-CM | POA: Diagnosis not present

## 2022-06-14 DIAGNOSIS — M47817 Spondylosis without myelopathy or radiculopathy, lumbosacral region: Secondary | ICD-10-CM

## 2022-06-14 DIAGNOSIS — I70229 Atherosclerosis of native arteries of extremities with rest pain, unspecified extremity: Secondary | ICD-10-CM

## 2022-06-14 DIAGNOSIS — I70223 Atherosclerosis of native arteries of extremities with rest pain, bilateral legs: Secondary | ICD-10-CM

## 2022-06-14 LAB — VAS US ABI WITH/WO TBI
Left ABI: 0.87
Right ABI: 1.08

## 2022-06-14 NOTE — Telephone Encounter (Signed)
Spoke with the patient and he is scheduled with Dr. Gilda Crease on 06/19/22 with a 6:45 am arrival time to the [facility] for a left leg angio. Pre-procedure instructions were discussed and will be mailed.

## 2022-06-19 ENCOUNTER — Encounter: Payer: Self-pay | Admitting: Vascular Surgery

## 2022-06-19 ENCOUNTER — Other Ambulatory Visit: Payer: Self-pay

## 2022-06-19 ENCOUNTER — Ambulatory Visit
Admission: RE | Admit: 2022-06-19 | Discharge: 2022-06-19 | Disposition: A | Discharge status: Home / Self Care | Payer: Medicaid Other | Source: Ambulatory Visit | Attending: Vascular Surgery | Admitting: Vascular Surgery

## 2022-06-19 ENCOUNTER — Encounter
Admission: RE | Disposition: A | Discharge status: Home / Self Care | Payer: Self-pay | Source: Ambulatory Visit | Attending: Vascular Surgery

## 2022-06-19 DIAGNOSIS — E782 Mixed hyperlipidemia: Secondary | ICD-10-CM | POA: Insufficient documentation

## 2022-06-19 DIAGNOSIS — M5137 Other intervertebral disc degeneration, lumbosacral region: Secondary | ICD-10-CM | POA: Insufficient documentation

## 2022-06-19 DIAGNOSIS — Z9889 Other specified postprocedural states: Secondary | ICD-10-CM | POA: Diagnosis not present

## 2022-06-19 DIAGNOSIS — I70222 Atherosclerosis of native arteries of extremities with rest pain, left leg: Secondary | ICD-10-CM | POA: Diagnosis present

## 2022-06-19 DIAGNOSIS — Z87891 Personal history of nicotine dependence: Secondary | ICD-10-CM | POA: Diagnosis not present

## 2022-06-19 DIAGNOSIS — T82858A Stenosis of vascular prosthetic devices, implants and grafts, initial encounter: Secondary | ICD-10-CM | POA: Diagnosis not present

## 2022-06-19 DIAGNOSIS — I70229 Atherosclerosis of native arteries of extremities with rest pain, unspecified extremity: Secondary | ICD-10-CM

## 2022-06-19 HISTORY — PX: LOWER EXTREMITY ANGIOGRAPHY: CATH118251

## 2022-06-19 LAB — CREATININE, SERUM
Creatinine, Ser: 0.61 mg/dL (ref 0.61–1.24)
GFR, Estimated: >60 mL/min (ref >60)

## 2022-06-19 LAB — BUN: BUN: 16 mg/dL (ref 6–20)

## 2022-06-19 SURGERY — LOWER EXTREMITY ANGIOGRAPHY
Anesthesia: Moderate Sedation | Site: Leg Lower | Laterality: Left

## 2022-06-19 MED ORDER — FAMOTIDINE 20 MG PO TABS
40.0000 mg | ORAL_TABLET | Freq: Once | ORAL | Status: AC | PRN
  Administered 2022-06-19: 40 mg via ORAL

## 2022-06-19 MED ORDER — ACETAMINOPHEN 325 MG PO TABS: 650.0000 mg | ORAL_TABLET | ORAL | Status: DC | PRN

## 2022-06-19 MED ORDER — FENTANYL CITRATE (PF) 100 MCG/2ML IJ SOLN
INTRAMUSCULAR | Status: DC | PRN
  Administered 2022-06-19 (×2): 50 ug via INTRAVENOUS

## 2022-06-19 MED ORDER — FENTANYL CITRATE (PF) 100 MCG/2ML IJ SOLN
INTRAMUSCULAR | Status: AC
  Filled 2022-06-19: qty 2

## 2022-06-19 MED ORDER — CEFAZOLIN SODIUM-DEXTROSE 2-4 GM/100ML-% IV SOLN
INTRAVENOUS | Status: AC
  Filled 2022-06-19: qty 100

## 2022-06-19 MED ORDER — STERILE WATER FOR INJECTION IJ SOLN
INTRAMUSCULAR | Status: AC
  Filled 2022-06-19: qty 10

## 2022-06-19 MED ORDER — METHYLPREDNISOLONE SODIUM SUCC 125 MG IJ SOLR
INTRAMUSCULAR | Status: AC
  Filled 2022-06-19: qty 2

## 2022-06-19 MED ORDER — METHYLPREDNISOLONE SODIUM SUCC 125 MG IJ SOLR
125.0000 mg | Freq: Once | INTRAMUSCULAR | Status: AC | PRN
  Administered 2022-06-19: 125 mg via INTRAVENOUS

## 2022-06-19 MED ORDER — HYDROMORPHONE HCL 1 MG/ML IJ SOLN: 1.0000 mg | Freq: Once | INTRAMUSCULAR | Status: DC | PRN

## 2022-06-19 MED ORDER — DIPHENHYDRAMINE HCL 50 MG/ML IJ SOLN
50.0000 mg | Freq: Once | INTRAMUSCULAR | Status: AC | PRN
  Administered 2022-06-19: 50 mg via INTRAVENOUS

## 2022-06-19 MED ORDER — MIDAZOLAM HCL 2 MG/2ML IJ SOLN
INTRAMUSCULAR | Status: DC | PRN
  Administered 2022-06-19 (×2): .5 mg via INTRAVENOUS
  Administered 2022-06-19: 2 mg via INTRAVENOUS

## 2022-06-19 MED ORDER — SODIUM CHLORIDE 0.9 % IV SOLN: INTRAVENOUS | Status: DC

## 2022-06-19 MED ORDER — HEPARIN SODIUM (PORCINE) 1000 UNIT/ML IJ SOLN
INTRAMUSCULAR | Status: DC | PRN
  Administered 2022-06-19: 4000 [IU] via INTRAVENOUS

## 2022-06-19 MED ORDER — SODIUM CHLORIDE 0.9% FLUSH: 3.0000 mL | Freq: Two times a day (BID) | INTRAVENOUS | Status: DC

## 2022-06-19 MED ORDER — OXYCODONE HCL 5 MG PO TABS: 5.0000 mg | ORAL_TABLET | ORAL | Status: DC | PRN

## 2022-06-19 MED ORDER — SODIUM CHLORIDE 0.9% FLUSH: 3.0000 mL | INTRAVENOUS | Status: DC | PRN

## 2022-06-19 MED ORDER — SODIUM CHLORIDE 0.9 % IV SOLN: 250.0000 mL | INTRAVENOUS | Status: DC | PRN

## 2022-06-19 MED ORDER — MORPHINE SULFATE (PF) 4 MG/ML IV SOLN: 2.0000 mg | INTRAVENOUS | Status: DC | PRN

## 2022-06-19 MED ORDER — ONDANSETRON HCL 4 MG/2ML IJ SOLN: 4.0000 mg | Freq: Four times a day (QID) | INTRAMUSCULAR | Status: DC | PRN

## 2022-06-19 MED ORDER — MIDAZOLAM HCL 5 MG/5ML IJ SOLN
INTRAMUSCULAR | Status: AC
  Filled 2022-06-19: qty 5

## 2022-06-19 MED ORDER — FAMOTIDINE 20 MG PO TABS
ORAL_TABLET | ORAL | Status: AC
  Filled 2022-06-19: qty 2

## 2022-06-19 MED ORDER — DIPHENHYDRAMINE HCL 50 MG/ML IJ SOLN
INTRAMUSCULAR | Status: AC
  Filled 2022-06-19: qty 1

## 2022-06-19 MED ORDER — HYDRALAZINE HCL 20 MG/ML IJ SOLN: 5.0000 mg | INTRAMUSCULAR | Status: DC | PRN

## 2022-06-19 MED ORDER — LABETALOL HCL 5 MG/ML IV SOLN: 10.0000 mg | INTRAVENOUS | Status: DC | PRN

## 2022-06-19 MED ORDER — CEFAZOLIN SODIUM-DEXTROSE 2-4 GM/100ML-% IV SOLN
2.0000 g | INTRAVENOUS | Status: AC
  Administered 2022-06-19: 2 g via INTRAVENOUS

## 2022-06-19 MED ORDER — MIDAZOLAM HCL 2 MG/ML PO SYRP: 8.0000 mg | ORAL_SOLUTION | Freq: Once | ORAL | Status: DC | PRN

## 2022-06-19 MED ORDER — HEPARIN SODIUM (PORCINE) 1000 UNIT/ML IJ SOLN
INTRAMUSCULAR | Status: AC
  Filled 2022-06-19: qty 10

## 2022-06-19 SURGICAL SUPPLY — 20 items
BALLN ULTRVRSE 8X80X75 (BALLOONS) ×1
BALLOON ULTRVRSE 8X80X75 (BALLOONS) IMPLANT
CATH ANGIO 5F PIGTAIL 65CM (CATHETERS) IMPLANT
COVER DRAPE FLUORO 36X44 (DRAPES) IMPLANT
COVER PROBE ULTRASOUND 5X96 (MISCELLANEOUS) IMPLANT
DEVICE STARCLOSE SE CLOSURE (Vascular Products) IMPLANT
GLIDECATH 4FR STR (CATHETERS) IMPLANT
GOWN STRL REUS W/ TWL LRG LVL3 (GOWN DISPOSABLE) ×1 IMPLANT
GOWN STRL REUS W/TWL LRG LVL3 (GOWN DISPOSABLE) ×1
KIT ENCORE 26 ADVANTAGE (KITS) IMPLANT
KIT RIGHT HEART (MISCELLANEOUS) IMPLANT
NDL ENTRY 21GA 7CM ECHOTIP (NEEDLE) IMPLANT
NEEDLE ENTRY 21GA 7CM ECHOTIP (NEEDLE) ×1 IMPLANT
PACK ANGIOGRAPHY (CUSTOM PROCEDURE TRAY) ×1 IMPLANT
SET INTRO CAPELLA COAXIAL (SET/KITS/TRAYS/PACK) IMPLANT
SHEATH BRITE TIP 5FRX11 (SHEATH) IMPLANT
SYR MEDRAD MARK 7 150ML (SYRINGE) IMPLANT
TUBING CONTRAST HIGH PRESS 72 (TUBING) IMPLANT
WIRE GUIDERIGHT .035X150 (WIRE) IMPLANT
WIRE SUPRACORE 300CM (WIRE) IMPLANT

## 2022-06-19 NOTE — Op Note (Signed)
[facility]  Percutaneous Study/Intervention Procedural Note   Date of Surgery: 06/19/2022,9:26 AM  Surgeon:Jarrid Lienhard, Latina Craver   Pre-operative Diagnosis: Atherosclerotic occlusive disease bilateral lower extremities with persistent wrist pain of the left lower extremity; evidence for greater than 80% stenosis within the left iliac reconstruction by duplex ultrasound   Post-operative diagnosis:  Same  Procedure(s) Performed:  1.  Abdominal aortogram  2.  Left lower extremity distal runoff  3.  Percutaneous transluminal angioplasty of the left common iliac artery reconstruction  4.  [employer] left common femoral    Anesthesia: Conscious sedation was administered by the interventional radiology RN under my direct supervision. IV Versed plus fentanyl were utilized. Continuous ECG, pulse oximetry and blood pressure was monitored throughout the entire procedure. Conscious sedation was administered for a total of 70 minutes.  Sheath: 5 French 11 cm Pinnacle sheath left common femoral retrograde  Contrast: 65 cc   Fluoroscopy Time: 8.4 minutes  Indications: Patient presented to the office for follow-up status post his recent very complex aortoiliac reconstruction.  He is complaining of persistent severe pain in the left lower extremity.  Noninvasive studies including duplex ultrasound and ABI both demonstrated significant persistent disease including a focal greater than 80% stenosis in the left iliac system by duplex and an ABI of 0.8 at the left ankle.  Given this constellation of findings and symptoms angiography was recommended with the hope that intervention would prevent limb loss as he recently experienced.  Risk and benefits were reviewed all questions were answered patient has agreed to proceed.  Procedure:  Dimitrius Mirhosseiniis a 57 y.o. male who was identified and appropriate procedural time out was performed.  The patient was then placed supine on the table  and prepped and draped in the usual sterile fashion.  Ultrasound was used to evaluate the left common femoral artery.  It was echolucent and pulsatile indicating it is patent .  An ultrasound image was acquired for the permanent record.  A micropuncture needle was used to access the left common femoral artery under direct ultrasound guidance.  The microwire was then advanced under fluoroscopic guidance without difficulty followed by the micro-sheath.  A 0.035 J wire was advanced without resistance and a 5Fr sheath was placed.    Pigtail catheter was advanced and positioned approximately 2 cm above the leading edge of the stents.  Abdominal aortogram was performed in the AP.  The pigtail catheter was then pulled into the left sided stents and bilateral oblique views were obtained.  The wire was reintroduced and a 4 French straight glide catheter was advanced over the wire and positioned at the level of the renal arteries.  Pressure monitor was then obtained.  Just proximal to the left iliac bifurcation there was a 10 to 15 mm drop in pressure.  This is of borderline significance.  Given the findings by duplex ultrasound and the ABI as well as the patient's persistent pain I felt that this warranted treatment.  4000 units of heparin was given a supra core wire was advanced through the 5 [place] glide catheter.  An 8 mm x 80 mm balloon was then advanced across this area in the left iliac stents.  Inflation was to 10 atm for approximately 1 minute.  Follow-up imaging demonstrated improved appearance of the stents with a uniform appearance to the stent.  Pressures were once again measured by reintroducing the 5 French catheter and the previously noted gradient was eliminated.  I then performed distal runoff  by hand-injection through the 5 [place] sheath.  Findings:   Aortogram: The proximal abdominal aorta and bilateral renal arteries are widely patent.  The stents which extend up to the level of the renal  arteries sitting approximately 2 mm below the left renal artery which is the lower artery appear widely patent.  There is some irregularity of the left at the distal common iliac level.  This correlates with a gradient of 10 to 15 mmHg on pull-through.  Right iliac stents appear widely patent and uniform in contour.  The left iliac bifurcation is widely patent the external iliac artery appears widely patent.  Left Lower Extremity: The left common femoral profunda femoris superficial femoral and popliteal arteries are widely patent and free of any significant atherosclerotic changes.  They look quite good.  At the trifurcation there is some mild disease with a 40 to 50% stenosis at the origin of the anterior tibial.  Anterior tibial is otherwise patent throughout its course.  Tibioperoneal trunk has some mild irregularity at its origin but this does not appear to be hemodynamically significant and the peroneal and posterior tibial are widely patent down to the foot filling the plantar arteries and the pedal arch.  Following angioplasty of the common iliac stents there is improvement in the contour of the stents.  The gradient is eliminated.  This appears to be a successful intervention.    Disposition: Patient was taken to the recovery room in stable condition having tolerated the procedure well.  Earl Lites Tarisha Fader 06/19/2022,9:26 AM

## 2022-06-19 NOTE — Interval H&P Note (Signed)
History and Physical Interval Note:  06/19/2022 7:43 AM  [name]  has presented today for surgery, with the diagnosis of LLE Angio  BARD   ASO w rest pain.  The various methods of treatment have been discussed with the patient and family. After consideration of risks, benefits and other options for treatment, the patient has consented to  Procedure(s): Lower Extremity Angiography (Left) as a surgical intervention.  The patient's history has been reviewed, patient examined, no change in status, stable for surgery.  I have reviewed the patient's chart and labs.  Questions were answered to the patient's satisfaction.     Levora Dredge

## 2022-06-20 ENCOUNTER — Telehealth (INDEPENDENT_AMBULATORY_CARE_PROVIDER_SITE_OTHER): Payer: Self-pay | Admitting: Vascular Surgery

## 2022-06-20 ENCOUNTER — Encounter: Payer: Self-pay | Admitting: Vascular Surgery

## 2022-06-20 NOTE — Telephone Encounter (Signed)
Patient called to remind Dr. Gilda Crease of a letter that he is supposed to give him for the Social Security.  He states he can come and pick up the letter.  Please advise.

## 2022-06-22 NOTE — Telephone Encounter (Signed)
Informed patient that the note is not finished and someone will be in touch when it is ready.

## 2022-07-02 ENCOUNTER — Encounter (INDEPENDENT_AMBULATORY_CARE_PROVIDER_SITE_OTHER): Payer: Self-pay | Admitting: Nurse Practitioner

## 2022-07-09 ENCOUNTER — Other Ambulatory Visit (INDEPENDENT_AMBULATORY_CARE_PROVIDER_SITE_OTHER): Payer: Self-pay | Admitting: Vascular Surgery

## 2022-07-09 DIAGNOSIS — Z9889 Other specified postprocedural states: Secondary | ICD-10-CM

## 2022-07-10 ENCOUNTER — Ambulatory Visit (INDEPENDENT_AMBULATORY_CARE_PROVIDER_SITE_OTHER): Payer: Medicaid Other

## 2022-07-10 ENCOUNTER — Ambulatory Visit (INDEPENDENT_AMBULATORY_CARE_PROVIDER_SITE_OTHER): Payer: Medicaid Other | Admitting: Nurse Practitioner

## 2022-07-10 DIAGNOSIS — Z9889 Other specified postprocedural states: Secondary | ICD-10-CM

## 2022-07-10 DIAGNOSIS — I739 Peripheral vascular disease, unspecified: Secondary | ICD-10-CM

## 2022-07-11 LAB — VAS US ABI WITH/WO TBI
Left ABI: 1.21
Right ABI: 1.34

## 2022-07-11 NOTE — Progress Notes (Signed)
MRN : [id number]  [name] is a 57 y.o. (1965/05/29) male who presents with chief complaint of check circulation.  History of Present Illness:   The patient returns to the office for followup and review of the noninvasive studies.  He notes that the pain overall in his leg is much better and his walking is much better but he is still having significant pain in his foot in the arch area that "shoots into his toe".  There have been no interval changes in lower extremity symptoms. No interval shortening of the patient's claudication distance or development of rest pain symptoms. No new ulcers or wounds have occurred since the last visit.  There have been no significant changes to the patient's overall health care.  The patient denies amaurosis fugax or recent TIA symptoms. There are no documented recent neurological changes noted. There is no history of DVT, PE or superficial thrombophlebitis. The patient denies recent episodes of angina or shortness of breath.   ABI Rt=1.34 and Lt=1.21  (previous ABI's Rt=1.08 and Lt=0.87) Duplex ultrasound of the aorta iliac shows normalization of velocities on the left.  No evidence of stenosis or residual stricture.  The waveforms are now triphasic bilaterally  No outpatient medications have been marked as taking for the 07/12/22 encounter (Appointment) with Gilda Crease, Latina Craver, MD.    Past Medical History:  Diagnosis Date   Carpal tunnel syndrome of left wrist    DJD (degenerative joint disease)    History of kidney stones    Hyperlipidemia    Leg pain    Low back pain 04/04/2017   PAD (peripheral artery disease) (HCC)    Peripheral vascular disease (HCC)    Right inguinal hernia     Past Surgical History:  Procedure Laterality Date   AORTA - BILATERAL FEMORAL ARTERY BYPASS GRAFT N/A 05/22/2017   Procedure: AORTA BIFEMORAL BYPASS GRAFT, Aortic bilateral, and internal ischemia;  Surgeon: Renford Dills, MD;   Location: [facility];  Service: Vascular;  Laterality: N/A;   CENTRAL VENOUS CATHETER INSERTION Left 05/22/2017   Procedure: INSERTION CENTRAL LINE ADULT Left Internal Jugular;  Surgeon: Renford Dills, MD;  Location: [facility];  Service: Vascular;  Laterality: Left;   COLONOSCOPY WITH PROPOFOL N/A 03/27/2017   Procedure: COLONOSCOPY WITH PROPOFOL;  Surgeon: Earline Mayotte, MD;  Location: [facility];  Service: Endoscopy;  Laterality: N/A;   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   Procedure: ENDOSCOPIC LEFT CARPAL TUNNEL RELEASE AND 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.;  Surgeon: Christena Flake, MD;  Location: [facility];  Service: Orthopedics;  Laterality: Left;   INSERTION OF MESH Right 06/28/2021   Procedure: INSERTION OF MESH;  Surgeon: Campbell Lerner, MD;  Location: [facility];  Service: General;  Laterality: Right;   KIDNEY STONE SURGERY     KNEE SURGERY Left 2006   LOWER EXTREMITY ANGIOGRAPHY Left 06/19/2022   Procedure: Lower Extremity Angiography;  Surgeon: Renford Dills, MD;  Location: [facility];  Service: Cardiovascular;  Laterality: Left;   LOWER EXTREMITY INTERVENTION Bilateral 05/15/2022   Procedure: LOWER EXTREMITY INTERVENTION;  Surgeon: Renford Dills, MD;  Location: [facility];  Service: Cardiovascular;  Laterality: Bilateral;   LOWER EXTREMITY INTERVENTION Bilateral 05/16/2022   Procedure: LOWER EXTREMITY INTERVENTION;  Surgeon: Renford Dills, MD;  Location: [facility];  Service: Cardiovascular;  Laterality: Bilateral;   LOWER EXTREMITY  INTERVENTION Bilateral 05/17/2022   Procedure: LOWER EXTREMITY INTERVENTION;  Surgeon: Renford Dills, MD;  Location: [facility];  Service: Cardiovascular;  Laterality: Bilateral;   ROBOTIC ASSISTED LAPAROSCOPIC LYSIS OF ADHESION N/A 06/28/2021   Procedure: XI ROBOTIC ASSISTED LAPAROSCOPIC LYSIS OF ADHESION;  Surgeon: Campbell Lerner, MD;  Location: [facility];  Service: General;  Laterality: N/A;    Social History Social History   Tobacco Use   Smoking status: Former    Packs/day: 0.25    Years: 30.00    Additional pack years: 0.00    Total pack years: 7.50    Types: Cigarettes    Passive exposure: Past   Smokeless tobacco: Never  Vaping Use   Vaping Use: Former  Substance Use Topics   Alcohol use: No   Drug use: No    Family History Family History  Problem Relation Age of Onset   Diabetes Mother    Heart disease Mother    Alzheimer's disease Father     Allergies  Allergen Reactions   Versed [Midazolam] Rash    raised  red whelps and burning right iv site after recieved fentanyl and versed.  Second procedure pt recieved  IV solumedrol IV benedryl and pepcid po and tolerated both.    Fentanyl Hives    raised red whelps and burning right iv site after recieved fentanyl and versed Second procedure pt recieved  IV solumedrol IV benedryl and pepcid po and tolerated both.       REVIEW OF SYSTEMS (Negative unless checked)  Constitutional: [] Weight loss  [] Fever  [] Chills Cardiac: [] Chest pain   [] Chest pressure   [] Palpitations   [] Shortness of breath when laying flat   [] Shortness of breath with exertion. Vascular:  [x] Pain in legs with walking   [] Pain in legs at rest  [] History of DVT   [] Phlebitis   [] Swelling in legs   [] Varicose veins   [] Non-healing ulcers Pulmonary:   [] Uses home oxygen   [] Productive cough   [] Hemoptysis   [] Wheeze  [] COPD   [] Asthma Neurologic:  [] Dizziness   [] Seizures   [] History of stroke   [] History of TIA  [] Aphasia   [] Vissual changes   [] Weakness or numbness in arm   [] Weakness or numbness in leg Musculoskeletal:   [] Joint swelling   [] Joint pain   [] Low back pain Hematologic:  [] Easy bruising  [] Easy bleeding   [] Hypercoagulable state   [] Anemic Gastrointestinal:  [] Diarrhea   [] Vomiting  [] Gastroesophageal reflux/heartburn   [] Difficulty swallowing. Genitourinary:  [] Chronic kidney  disease   [] Difficult urination  [] Frequent urination   [] Blood in urine Skin:  [] Rashes   [] Ulcers  Psychological:  [] History of anxiety   []  History of major depression.  Physical Examination  There were no vitals filed for this visit. There is no height or weight on file to calculate BMI. Gen: WD/WN, NAD Head: [place]/AT, No temporalis wasting.  Ear/Nose/Throat: Hearing grossly intact, nares w/o erythema or drainage Eyes: PER, EOMI, sclera nonicteric.  Neck: Supple, no masses.  No bruit or JVD.  Pulmonary:  Good air movement, no audible wheezing, no use of accessory muscles.  Cardiac: RRR, normal S1, S2, no Murmurs. Vascular:  mild trophic changes, no open wounds Vessel Right Left  Radial Palpable Palpable  PT  Palpable  Palpable  DP  Palpable  Palpable  Gastrointestinal: soft, non-distended. No guarding/no peritoneal signs.  Musculoskeletal: M/S 5/5 throughout.  No visible deformity.  Neurologic: CN 2-12 intact. Pain and light touch intact in extremities.  Symmetrical.  Speech is fluent. Motor exam as listed above. Psychiatric: Judgment intact, Mood & affect appropriate for pt's clinical situation. Dermatologic: No rashes or ulcers noted.  No changes consistent with cellulitis.   CBC Lab Results  Component Value Date   WBC 7.2 05/21/2022   HGB 10.4 (L) 05/21/2022   HCT 29.9 (L) 05/21/2022   MCV 84.0 05/21/2022   PLT 139 (L) 05/21/2022    BMET    Component Value Date/Time   NA 136 05/21/2022 0511   NA 142 11/21/2021 1035   K 3.8 05/21/2022 0511   CL 104 05/21/2022 0511   CO2 23 05/21/2022 0511   GLUCOSE 95 05/21/2022 0511   BUN 16 06/19/2022 0728   BUN 16 11/21/2021 1035   CREATININE 0.61 06/19/2022 0728   CALCIUM 9.0 05/21/2022 0511   GFRNONAA >60 06/19/2022 0728   GFRAA >60 05/27/2017 0454   CrCl cannot be calculated (Patient's most recent lab result is older than the maximum 21 days allowed.).  COAG Lab Results  Component Value Date   INR 1.3 (H) 05/17/2022    INR 1.04 05/21/2017    Radiology VAS US AORTA/IVC/ILIACS  Result Date: 07/11/2022 ABDOMINAL AORTA STUDY Patient Name:  [name]  Date of Exam:   07/10/2022 Medical Rec #: [id number]           Accession #:    [id number] Date of Birth: [birth year]           Patient Gender: M Patient Age:   73 years Exam Location:  [facility] Procedure:      VAS US AORTA/IVC/ILIACS Referring Phys: Levora Dredge --------------------------------------------------------------------------------  Risk Factors: Hyperlipidemia. Vascular Interventions: 06/19/2022 Percutaneous transluminal angioplasty of the                         left common iliac artery reconstruction                         05/17/2022                         PTA and stent left external iliac, bilateral limbs of                         aorto-iliac bpg                          Surgery date 05/22/2017 Aortobiiliac bypass graft with                         reimplantation of the IMA into the bypass graft.                         Bilateral CIA                         endarterectomies. Limitations: Air/bowel gas.  Performing Technologist: Salvadore Farber RVT  Examination Guidelines: A complete evaluation includes B-mode imaging, spectral Doppler, color Doppler, and power Doppler as needed of all accessible portions of each vessel. Bilateral testing is considered an integral part of a complete examination. Limited examinations for reoccurring indications may be performed as noted.  Abdominal Aorta Findings: +-------------+-------+----------+----------+---------+--------+--------+ Location     AP (cm)Trans (cm)PSV (cm/s)Waveform ThrombusComments +-------------+-------+----------+----------+---------+--------+--------+ Mid  187       triphasic                 +-------------+-------+----------+----------+---------+--------+--------+ RT CIA Prox                   123       triphasic                  +-------------+-------+----------+----------+---------+--------+--------+ RT CIA Mid                    89        biphasic                  +-------------+-------+----------+----------+---------+--------+--------+ RT CIA Distal                 74        biphasic                  +-------------+-------+----------+----------+---------+--------+--------+ RT EIA Prox                   94        triphasic                 +-------------+-------+----------+----------+---------+--------+--------+ RT EIA Mid                    94        triphasic                 +-------------+-------+----------+----------+---------+--------+--------+ RT EIA Distal                 86        biphasic                  +-------------+-------+----------+----------+---------+--------+--------+ LT CIA Prox                   99        biphasic                  +-------------+-------+----------+----------+---------+--------+--------+ LT CIA Mid                    105       triphasic                 +-------------+-------+----------+----------+---------+--------+--------+ LT CIA Distal                 162       triphasic                 +-------------+-------+----------+----------+---------+--------+--------+ LT EIA Prox                   118       biphasic                  +-------------+-------+----------+----------+---------+--------+--------+ LT EIA Mid                    123       triphasic                 +-------------+-------+----------+----------+---------+--------+--------+ LT EIA Distal                 96        biphasic                  +-------------+-------+----------+----------+---------+--------+--------+ Widely patent aortobifem s/p left limb intervention.  Summary: Abdominal Aorta: No evidence  of an abdominal aortic aneurysm was visualized. Widely patent Aorto-Bifem s/p left limb PTA with no evidence of significant stenosis. Normal ABI's  bilaterally.  *See table(s) above for measurements and observations.  Electronically signed by Levora Dredge MD on 07/11/2022 at 10:14:28 AM.    Final    VAS [place] ABI WITH/WO TBI  Result Date: 07/11/2022  LOWER EXTREMITY DOPPLER STUDY Patient Name:  [name]  Date of Exam:   07/10/2022 Medical Rec #: [id number]           Accession #:    [id number] Date of Birth: [birth year]           Patient Gender: M Patient Age:   36 years Exam Location:  [facility] Procedure:      VAS [place] ABI WITH/WO TBI Referring Phys: --------------------------------------------------------------------------------  Indications: Claudication, and peripheral artery disease. High Risk Factors: Hyperlipidemia.  Vascular Interventions: 06/19/2022 Percutaneous transluminal angioplasty of the                         left common iliac artery reconstruction                         05/17/2022                         PTA and stent left external iliac, bilateral limbs of                         aorto-iliac bpg                          Surgery date 05/22/2017 Aortobiiliac bypass graft with                         reimplantation of the IMA into the bypass graft.                         Bilateral CIA                         endarterectomies. Comparison Study: 06/13/2022 Performing Technologist: Salvadore Farber RVT  Examination Guidelines: A complete evaluation includes at minimum, Doppler waveform signals and systolic blood pressure reading at the level of bilateral brachial, anterior tibial, and posterior tibial arteries, when vessel segments are accessible. Bilateral testing is considered an integral part of a complete examination. Photoelectric Plethysmograph (PPG) waveforms and toe systolic pressure readings are included as required and additional duplex testing as needed. Limited examinations for reoccurring indications may be performed as noted.  ABI Findings: +---------+------------------+-----+---------+--------+ Right    Rt Pressure  (mmHg)IndexWaveform Comment  +---------+------------------+-----+---------+--------+ Brachial 117                                      +---------+------------------+-----+---------+--------+ ATA      123               1.05 biphasic          +---------+------------------+-----+---------+--------+ PTA      157               1.34 triphasic         +---------+------------------+-----+---------+--------+ [facility]  1.01 Normal            +---------+------------------+-----+---------+--------+ +---------+------------------+-----+---------+-------+ Left     Lt Pressure (mmHg)IndexWaveform Comment +---------+------------------+-----+---------+-------+ Brachial 115                                     +---------+------------------+-----+---------+-------+ ATA      141               1.21 triphasic        +---------+------------------+-----+---------+-------+ PTA      131               1.12 triphasic        +---------+------------------+-----+---------+-------+ Kerby Nora               1.00                  +---------+------------------+-----+---------+-------+ +-------+-----------+-----------+------------+------------+ ABI/TBIToday's ABIToday's TBIPrevious ABIPrevious TBI +-------+-----------+-----------+------------+------------+ Right  1.34       1.01       1.08        .88          +-------+-----------+-----------+------------+------------+ Left   1.21       1.00       .87         .65          +-------+-----------+-----------+------------+------------+ Left ABIs and TBIs appear increased compared to prior study on 06/14/2022.  Summary: Right: Resting right ankle-brachial index is within normal range. The right toe-brachial index is normal. Left: Resting left ankle-brachial index is within normal range. The left toe-brachial index is normal. Improved flow throughout the left branch aortobifem s/p PTA of mid CIA limb. *See table(s)  above for measurements and observations.  Electronically signed by Levora Dredge MD on 07/11/2022 at 10:14:20 AM.    Final    PERIPHERAL VASCULAR CATHETERIZATION  Result Date: 06/19/2022 See surgical note for result.  VAS [place] ABI WITH/WO TBI  Result Date: 06/14/2022  LOWER EXTREMITY DOPPLER STUDY Patient Name:  [name]  Date of Exam:   06/13/2022 Medical Rec #: [id number]           Accession #:    [id number] Date of Birth: Jan 14, 1966           Patient Gender: M Patient Age:   70 years Exam Location:  [facility] Procedure:      VAS [place] ABI WITH/WO TBI Referring Phys: [facility] --------------------------------------------------------------------------------  Indications: Claudication, and peripheral artery disease. High Risk Factors: Hyperlipidemia, past history of smoking.  Vascular Interventions: 05/17/2022                         PTA and stent left external iliac, bilateral limbs of                         aorto-iliac bpg                          Surgery date 05/22/2017 Aortobiiliac bypass graft with                         reimplantation of the IMA into the bypass graft.                         Bilateral CIA  endarterectomies. Performing Technologist: Hardie Lora RVT  Examination Guidelines: A complete evaluation includes at minimum, Doppler waveform signals and systolic blood pressure reading at the level of bilateral brachial, anterior tibial, and posterior tibial arteries, when vessel segments are accessible. Bilateral testing is considered an integral part of a complete examination. Photoelectric Plethysmograph (PPG) waveforms and toe systolic pressure readings are included as required and additional duplex testing as needed. Limited examinations for reoccurring indications may be performed as noted.  ABI Findings: +---------+------------------+-----+---------+--------+ Right    Rt Pressure (mmHg)IndexWaveform Comment   +---------+------------------+-----+---------+--------+ Brachial 119                                      +---------+------------------+-----+---------+--------+ PTA      128               1.08 triphasic         +---------+------------------+-----+---------+--------+ DP       127               1.07 triphasic         +---------+------------------+-----+---------+--------+ Great Toe105               0.88                   +---------+------------------+-----+---------+--------+ +---------+------------------+-----+----------+-------+ Left     Lt Pressure (mmHg)IndexWaveform  Comment +---------+------------------+-----+----------+-------+ Brachial 117                                      +---------+------------------+-----+----------+-------+ PTA      104               0.87 monophasic        +---------+------------------+-----+----------+-------+ DP       90                0.76 monophasic        +---------+------------------+-----+----------+-------+ Great Toe77                0.65                   +---------+------------------+-----+----------+-------+ +-------+-----------+-----------+------------+------------+ ABI/TBIToday's ABIToday's TBIPrevious ABIPrevious TBI +-------+-----------+-----------+------------+------------+ Right  1.08       0.88       0.53        0.37         +-------+-----------+-----------+------------+------------+ Left   0.87       0.65       0.49                     +-------+-----------+-----------+------------+------------+ Bilateral ABIs appear increased compared to prior study on 05/04/2022.  Summary: Right: Resting right ankle-brachial index is within normal range. The right toe-brachial index is abnormal. Left: Resting left ankle-brachial index indicates mild left lower extremity arterial disease. The left toe-brachial index is abnormal. *See table(s) above for measurements and observations.  Electronically signed by  Levora Dredge MD on 06/14/2022 at 11:19:01 AM.    Final    VAS US AORTA/IVC/ILIACS  Result Date: 06/14/2022 ABDOMINAL AORTA STUDY Patient Name:  [name]  Date of Exam:   06/13/2022 Medical Rec #: [id number]           Accession #:    [id number] Date of Birth: 12-30-1965           Patient Gender: Judie Petit  Patient Age:   60 years Exam Location:  [facility] Procedure:      VAS US AORTA/IVC/ILIACS Referring Phys: Levora Dredge --------------------------------------------------------------------------------  Risk Factors: Hyperlipidemia, past history of smoking. Vascular Interventions: 05/17/2022                         PTA and stent left external iliac, bilateral limbs of                         aorto-iliac bpg                          Surgery date 05/22/2017 Aortobiiliac bypass graft with                         reimplantation of the IMA into the bypass graft.                         Bilateral CIA                         endarterectomies.  Performing Technologist: Hardie Lora RVT  Examination Guidelines: A complete evaluation includes B-mode imaging, spectral Doppler, color Doppler, and power Doppler as needed of all accessible portions of each vessel. Bilateral testing is considered an integral part of a complete examination. Limited examinations for reoccurring indications may be performed as noted.  Right Stent(s): +------------------------------------+--------+--------+---------+--------+ Right limb aorto- iliac bypass graftPSV cm/sStenosisWaveform Comments +------------------------------------+--------+--------+---------+--------+ Prox to Stent                       248             triphasic         +------------------------------------+--------+--------+---------+--------+ Proximal Stent                      194             triphasic         +------------------------------------+--------+--------+---------+--------+ Mid Stent                           222              triphasic         +------------------------------------+--------+--------+---------+--------+ Distal Stent                        186             triphasic         +------------------------------------+--------+--------+---------+--------+ Distal to Stent                     112             triphasic         +------------------------------------+--------+--------+---------+--------+   Left Stent(s): +--------------------------+--------+---------------+----------+--------+ Left limb Aorto -iliac bpgPSV cm/sStenosis       Waveform  Comments +--------------------------+--------+---------------+----------+--------+ Prox to Stent             176                                       +--------------------------+--------+---------------+----------+--------+ Proximal Stent  388     50-99% stenosismonophasic         +--------------------------+--------+---------------+----------+--------+ Mid Stent                 401     50-99% stenosismonophasic         +--------------------------+--------+---------------+----------+--------+ Distal Stent              203                    monophasic         +--------------------------+--------+---------------+----------+--------+  +-------------------+--------+--------+----------+--------+ Left external iliacPSV cm/sStenosisWaveform  Comments +-------------------+--------+--------+----------+--------+ Prox to Stent      203             monophasic         +-------------------+--------+--------+----------+--------+ Proximal Stent     200             monophasic         +-------------------+--------+--------+----------+--------+ Mid Stent          53              monophasic         +-------------------+--------+--------+----------+--------+ Distal Stent       29              monophasic         +-------------------+--------+--------+----------+--------+ Distal to Stent    53              monophasic          +-------------------+--------+--------+----------+--------+   Summary: Stenosis: +--------------------+---------------+ Location            Stent           +--------------------+---------------+ Right Common Iliac  no stenosis     +--------------------+---------------+ Left Common Iliac   50-99% stenosis +--------------------+---------------+ Right External Iliacno stenosis     +--------------------+---------------+ In stent stenosis of the left limb of the aorto-iliac bypass graft.  *See table(s) above for measurements and observations.  Electronically signed by Levora Dredge MD on 06/14/2022 at 11:18:16 AM.    Final      Assessment/Plan 1. Atherosclerosis of native artery of both lower extremities with intermittent claudication (HCC) Recommend:  The patient is status post successful angiogram with intervention.  The patient reports that the claudication symptoms and leg pain has improved.   The patient denies lifestyle limiting changes at this point in time.  No further invasive studies, angiography or surgery at this time The patient should continue walking and begin a more formal exercise program.  The patient should continue antiplatelet therapy and aggressive treatment of the lipid abnormalities  Continued surveillance is indicated as atherosclerosis is likely to progress with time.    Patient should undergo noninvasive studies as ordered. The patient will follow up with me to review the studies.  - VAS [place] ABI WITH/WO TBI; Future  2. DJD (degenerative joint disease), lumbosacral Continue NSAID medications as already ordered, these medications have been reviewed and there are no changes at this time.  Continued activity and therapy was stressed.  3. Mixed hyperlipidemia Continue statin as ordered and reviewed, no changes at this time    Levora Dredge, MD  07/11/2022 9:19 PM

## 2022-07-12 ENCOUNTER — Ambulatory Visit (INDEPENDENT_AMBULATORY_CARE_PROVIDER_SITE_OTHER): Payer: Medicaid Other | Admitting: Vascular Surgery

## 2022-07-12 VITALS — BP 107/72 | HR 75 | Resp 17 | Ht 65.0 in | Wt 165.0 lb

## 2022-07-12 DIAGNOSIS — I70213 Atherosclerosis of native arteries of extremities with intermittent claudication, bilateral legs: Secondary | ICD-10-CM

## 2022-07-12 DIAGNOSIS — E782 Mixed hyperlipidemia: Secondary | ICD-10-CM | POA: Diagnosis not present

## 2022-07-12 DIAGNOSIS — M47817 Spondylosis without myelopathy or radiculopathy, lumbosacral region: Secondary | ICD-10-CM | POA: Diagnosis not present

## 2022-07-15 ENCOUNTER — Encounter (INDEPENDENT_AMBULATORY_CARE_PROVIDER_SITE_OTHER): Payer: Self-pay | Admitting: Vascular Surgery

## 2022-08-28 ENCOUNTER — Encounter: Payer: Disability Insurance | Admitting: Student in an Organized Health Care Education/Training Program

## 2022-10-13 NOTE — Progress Notes (Deleted)
MRN : [id number]  [name] is a 57 y.o. (1965-09-15) male who presents with chief complaint of check circulation.  History of Present Illness:  The patient returns to the office for followup and review of the noninvasive studies.  He notes that the pain overall in his leg is much better and his walking is much better but he is still having significant pain in his foot in the arch area that "shoots into his toe".   There have been no interval changes in lower extremity symptoms. No interval shortening of the patient's claudication distance or development of rest pain symptoms. No new ulcers or wounds have occurred since the last visit.   There have been no significant changes to the patient's overall health care.   The patient denies amaurosis fugax or recent TIA symptoms. There are no documented recent neurological changes noted. There is no history of DVT, PE or superficial thrombophlebitis. The patient denies recent episodes of angina or shortness of breath.    ABI Rt=1.34 and Lt=1.21  (previous ABI's Rt=1.08 and Lt=0.87) Duplex ultrasound of the aorta iliac shows normalization of velocities on the left.  No evidence of stenosis or residual stricture.  The waveforms are now triphasic bilaterally   No outpatient medications have been marked as taking for the 10/15/22 encounter (Appointment) with Gilda Crease, Latina Craver, MD.    Past Medical History:  Diagnosis Date   Carpal tunnel syndrome of left wrist    DJD (degenerative joint disease)    History of kidney stones    Hyperlipidemia    Leg pain    Low back pain 04/04/2017   PAD (peripheral artery disease) (HCC)    Peripheral vascular disease (HCC)    Right inguinal hernia     Past Surgical History:  Procedure Laterality Date   AORTA - BILATERAL FEMORAL ARTERY BYPASS GRAFT N/A 05/22/2017   Procedure: AORTA BIFEMORAL BYPASS GRAFT, Aortic bilateral, and  internal ischemia;  Surgeon: Renford Dills, MD;  Location: [facility];  Service: Vascular;  Laterality: N/A;   CENTRAL VENOUS CATHETER INSERTION Left 05/22/2017   Procedure: INSERTION CENTRAL LINE ADULT Left Internal Jugular;  Surgeon: Renford Dills, MD;  Location: [facility];  Service: Vascular;  Laterality: Left;   COLONOSCOPY WITH PROPOFOL N/A 03/27/2017   Procedure: COLONOSCOPY WITH PROPOFOL;  Surgeon: Earline Mayotte, MD;  Location: [facility];  Service: Endoscopy;  Laterality: N/A;   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   Procedure: ENDOSCOPIC LEFT CARPAL TUNNEL RELEASE AND 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.;  Surgeon: Christena Flake, MD;  Location: [facility];  Service: Orthopedics;  Laterality: Left;   INSERTION OF MESH Right 06/28/2021   Procedure: INSERTION OF MESH;  Surgeon: Campbell Lerner, MD;  Location: [facility];  Service: General;  Laterality: Right;   KIDNEY STONE SURGERY     KNEE SURGERY Left 2006   LOWER EXTREMITY ANGIOGRAPHY Left 06/19/2022   Procedure: Lower Extremity Angiography;  Surgeon: Renford Dills, MD;  Location: [facility];  Service: Cardiovascular;  Laterality: Left;   LOWER EXTREMITY INTERVENTION Bilateral 05/15/2022  Procedure: LOWER EXTREMITY INTERVENTION;  Surgeon: Renford Dills, MD;  Location: [facility];  Service: Cardiovascular;  Laterality: Bilateral;   LOWER EXTREMITY INTERVENTION Bilateral 05/16/2022   Procedure: LOWER EXTREMITY INTERVENTION;  Surgeon: Renford Dills, MD;  Location: [facility];  Service: Cardiovascular;  Laterality: Bilateral;   LOWER EXTREMITY INTERVENTION Bilateral 05/17/2022   Procedure: LOWER EXTREMITY INTERVENTION;  Surgeon: Renford Dills, MD;  Location: [facility];  Service: Cardiovascular;  Laterality: Bilateral;   ROBOTIC ASSISTED LAPAROSCOPIC LYSIS OF ADHESION N/A 06/28/2021   Procedure: XI ROBOTIC ASSISTED LAPAROSCOPIC LYSIS OF  ADHESION;  Surgeon: Campbell Lerner, MD;  Location: [facility];  Service: General;  Laterality: N/A;    Social History Social History   Tobacco Use   Smoking status: Former    Current packs/day: 0.25    Average packs/day: 0.3 packs/day for 30.0 years (7.5 ttl pk-yrs)    Types: Cigarettes    Passive exposure: Past   Smokeless tobacco: Never  Vaping Use   Vaping status: Former  Substance Use Topics   Alcohol use: No   Drug use: No    Family History Family History  Problem Relation Age of Onset   Diabetes Mother    Heart disease Mother    Alzheimer's disease Father     Allergies  Allergen Reactions   Versed [Midazolam] Rash    raised  red whelps and burning right iv site after recieved fentanyl and versed.  Second procedure pt recieved  IV solumedrol IV benedryl and pepcid po and tolerated both.    Fentanyl Hives    raised red whelps and burning right iv site after recieved fentanyl and versed Second procedure pt recieved  IV solumedrol IV benedryl and pepcid po and tolerated both.       REVIEW OF SYSTEMS (Negative unless checked)  Constitutional: [] Weight loss  [] Fever  [] Chills Cardiac: [] Chest pain   [] Chest pressure   [] Palpitations   [] Shortness of breath when laying flat   [] Shortness of breath with exertion. Vascular:  [x] Pain in legs with walking   [] Pain in legs at rest  [] History of DVT   [] Phlebitis   [] Swelling in legs   [] Varicose veins   [] Non-healing ulcers Pulmonary:   [] Uses home oxygen   [] Productive cough   [] Hemoptysis   [] Wheeze  [] COPD   [] Asthma Neurologic:  [] Dizziness   [] Seizures   [] History of stroke   [] History of TIA  [] Aphasia   [] Vissual changes   [] Weakness or numbness in arm   [] Weakness or numbness in leg Musculoskeletal:   [] Joint swelling   [] Joint pain   [] Low back pain Hematologic:  [] Easy bruising  [] Easy bleeding   [] Hypercoagulable state   [] Anemic Gastrointestinal:  [] Diarrhea   [] Vomiting  [] Gastroesophageal reflux/heartburn    [] Difficulty swallowing. Genitourinary:  [] Chronic kidney disease   [] Difficult urination  [] Frequent urination   [] Blood in urine Skin:  [] Rashes   [] Ulcers  Psychological:  [] History of anxiety   []  History of major depression.  Physical Examination  There were no vitals filed for this visit. There is no height or weight on file to calculate BMI. Gen: WD/WN, NAD Head: [place]/AT, No temporalis wasting.  Ear/Nose/Throat: Hearing grossly intact, nares w/o erythema or drainage Eyes: PER, EOMI, sclera nonicteric.  Neck: Supple, no masses.  No bruit or JVD.  Pulmonary:  Good air movement, no audible wheezing, no use of accessory muscles.  Cardiac: RRR, normal S1, S2, no Murmurs. Vascular:  mild trophic  changes, no open wounds Vessel Right Left  Radial Palpable Palpable  PT Not Palpable Not Palpable  DP Not Palpable Not Palpable  Gastrointestinal: soft, non-distended. No guarding/no peritoneal signs.  Musculoskeletal: M/S 5/5 throughout.  No visible deformity.  Neurologic: CN 2-12 intact. Pain and light touch intact in extremities.  Symmetrical.  Speech is fluent. Motor exam as listed above. Psychiatric: Judgment intact, Mood & affect appropriate for pt's clinical situation. Dermatologic: No rashes or ulcers noted.  No changes consistent with cellulitis.   CBC Lab Results  Component Value Date   WBC 7.2 05/21/2022   HGB 10.4 (L) 05/21/2022   HCT 29.9 (L) 05/21/2022   MCV 84.0 05/21/2022   PLT 139 (L) 05/21/2022    BMET    Component Value Date/Time   NA 136 05/21/2022 0511   NA 142 11/21/2021 1035   K 3.8 05/21/2022 0511   CL 104 05/21/2022 0511   CO2 23 05/21/2022 0511   GLUCOSE 95 05/21/2022 0511   BUN 16 06/19/2022 0728   BUN 16 11/21/2021 1035   CREATININE 0.61 06/19/2022 0728   CALCIUM 9.0 05/21/2022 0511   GFRNONAA >60 06/19/2022 0728   GFRAA >60 05/27/2017 0454   CrCl cannot be calculated (Patient's most recent lab result is older than the maximum 21 days  allowed.).  COAG Lab Results  Component Value Date   INR 1.3 (H) 05/17/2022   INR 1.04 05/21/2017    Radiology No results found.   Assessment/Plan There are no diagnoses linked to this encounter.   Levora Dredge, MD  10/13/2022 2:01 PM

## 2022-10-15 ENCOUNTER — Ambulatory Visit (INDEPENDENT_AMBULATORY_CARE_PROVIDER_SITE_OTHER): Payer: Medicaid Other | Admitting: Vascular Surgery

## 2022-10-15 ENCOUNTER — Encounter (INDEPENDENT_AMBULATORY_CARE_PROVIDER_SITE_OTHER): Payer: Medicaid Other

## 2022-10-15 DIAGNOSIS — M47817 Spondylosis without myelopathy or radiculopathy, lumbosacral region: Secondary | ICD-10-CM

## 2022-10-15 DIAGNOSIS — I70213 Atherosclerosis of native arteries of extremities with intermittent claudication, bilateral legs: Secondary | ICD-10-CM

## 2022-10-15 DIAGNOSIS — E782 Mixed hyperlipidemia: Secondary | ICD-10-CM

## 2023-07-08 IMAGING — MR MR CERVICAL SPINE W/O CM
5 series · 39 of 48 positions shown · non-contrast
Comparison: Cervical spine radiographs 06/13/2021

CLINICAL DATA: Left neck pain radiating down the left arm with
weakness.

EXAM:
MRI CERVICAL SPINE WITHOUT CONTRAST
TECHNIQUE: Multiplanar, multisequence MR imaging of the cervical spine was
performed. No intravenous contrast was administered.

[Series 5: T2 · sagittal · 3.0mm · 0.62mm/px · 7 of 15 slices shown (1 of 2)]
[im 1/15]
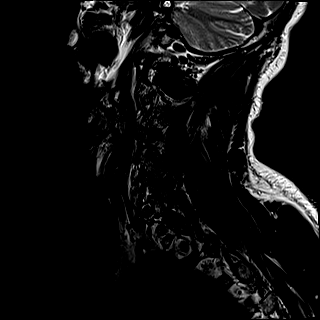
[im 3/15]
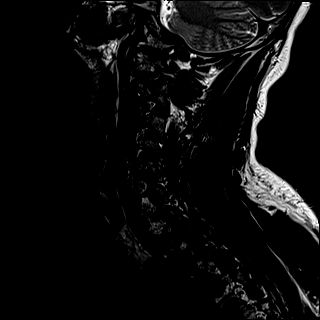
[im 5/15]
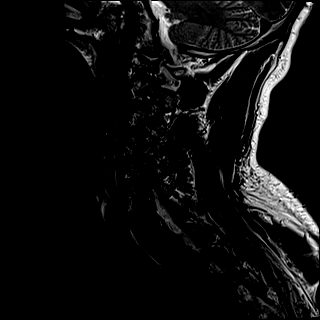
[im 8/15]
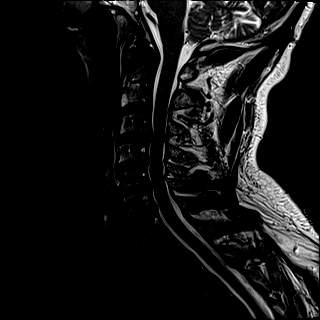
[im 10/15]
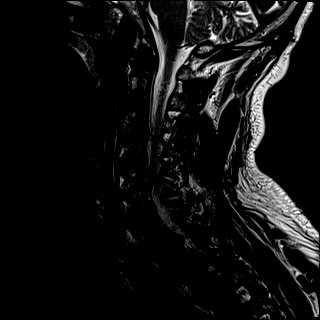
[im 12/15]
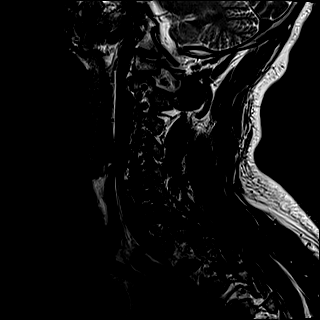
[im 15/15]
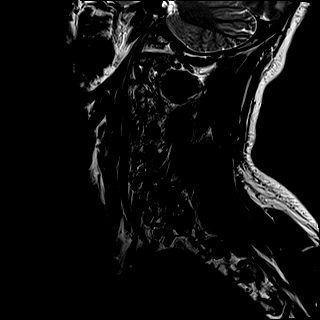

[Series 6: FLAIR · sagittal · 3.0mm · 0.78mm/px · 7 of 15 slices shown]
[im 1/15]
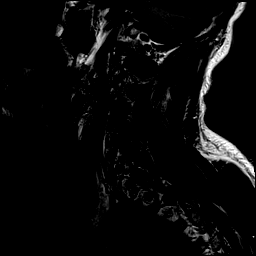
[im 3/15]
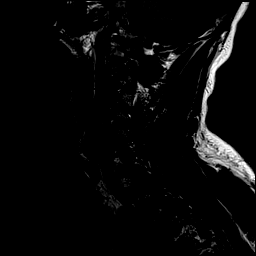
[im 5/15]
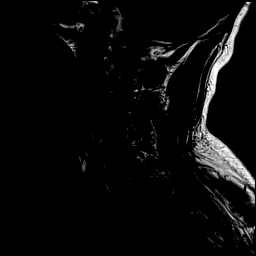
[im 8/15]
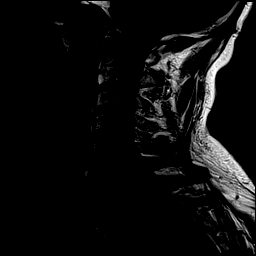
[im 10/15]
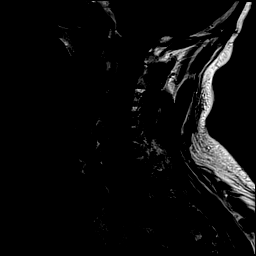
[im 12/15]
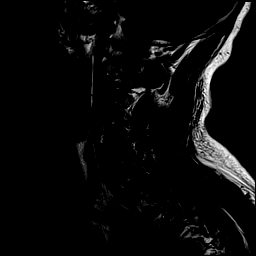
[im 15/15]
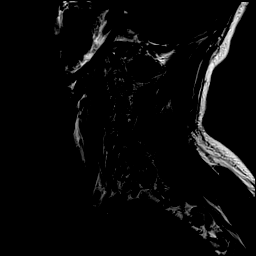

[Series 7: STIR · sagittal · 3.0mm · 0.62mm/px · 7 of 15 slices shown]
[im 1/15]
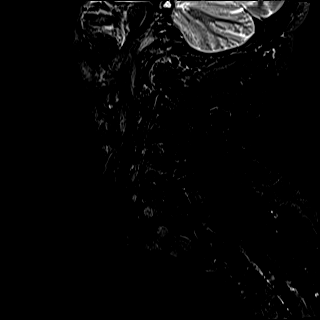
[im 3/15]
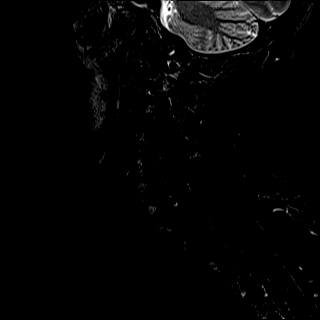
[im 5/15]
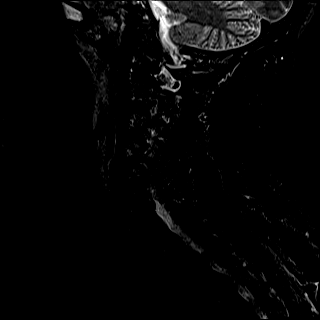
[im 8/15]
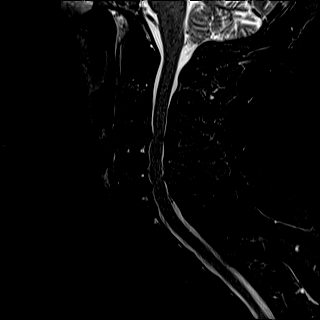
[im 10/15]
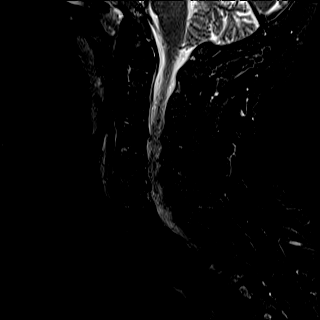
[im 12/15]
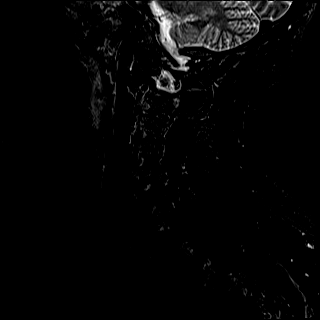
[im 15/15]
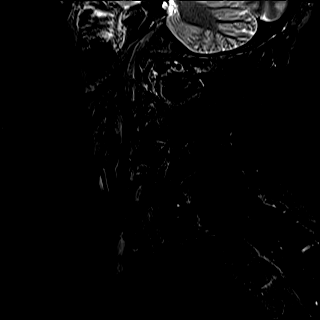

[Series 8: T2 · axial · 3.0mm · 0.70mm/px · z∈[-141,-43]mm · 10 of 28 slices shown (2 of 2)]
[im 1/28]
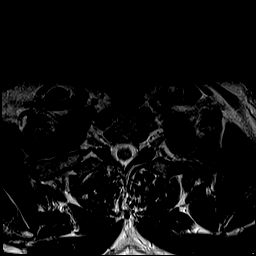
[im 3/28]
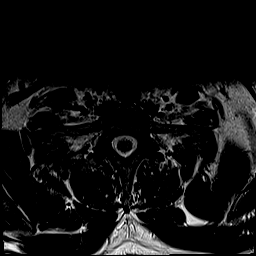
[im 5/28]
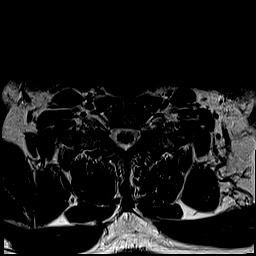
[im 10/28]
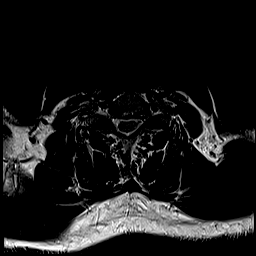
[im 12/28]
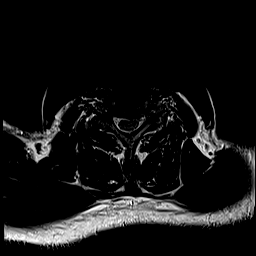
[im 14/28]
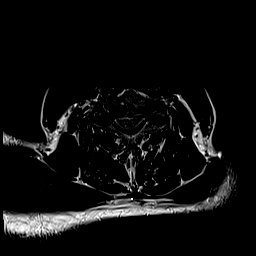
[im 16/28]
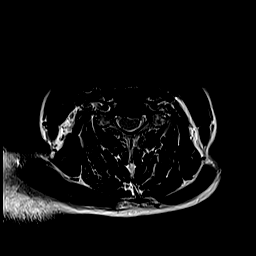
[im 19/28]
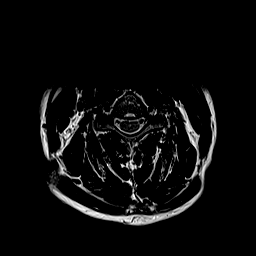
[im 23/28]
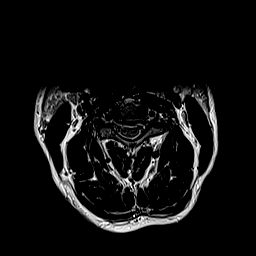
[im 28/28]
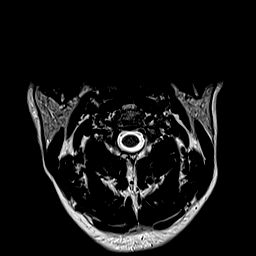

[Series 9: ax mpgr · axial · 3.0mm · 0.35mm/px · z∈[-141,-43]mm · 8 of 29 slices shown]
[im 1/29]
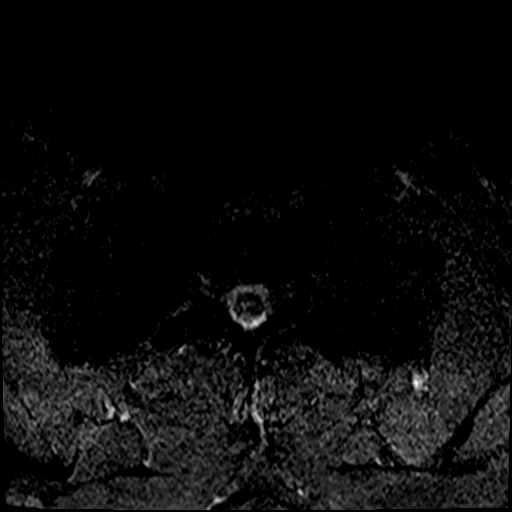
[im 5/29]
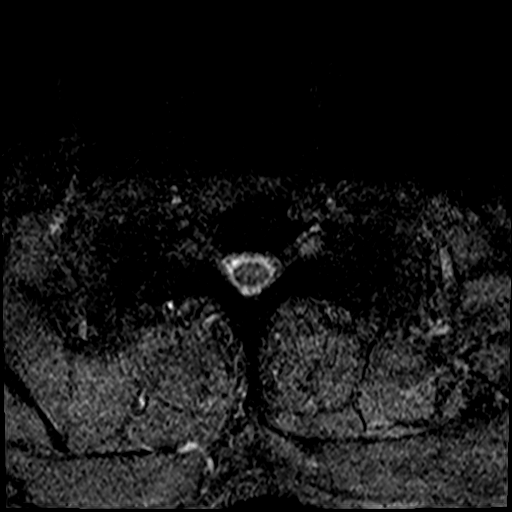
[im 9/29]
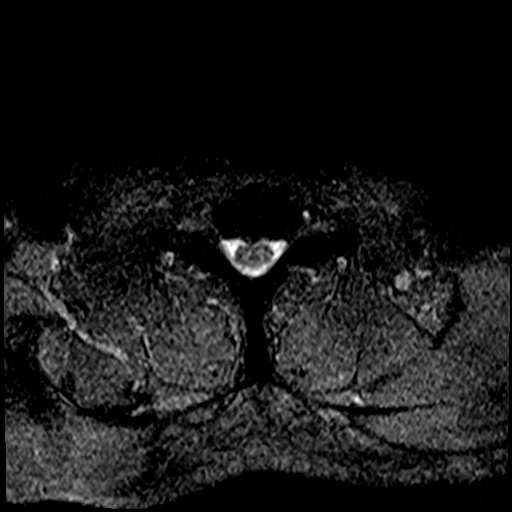
[im 13/29]
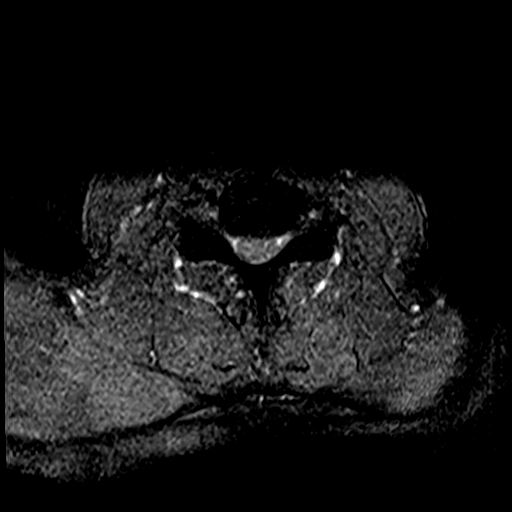
[im 16/29]
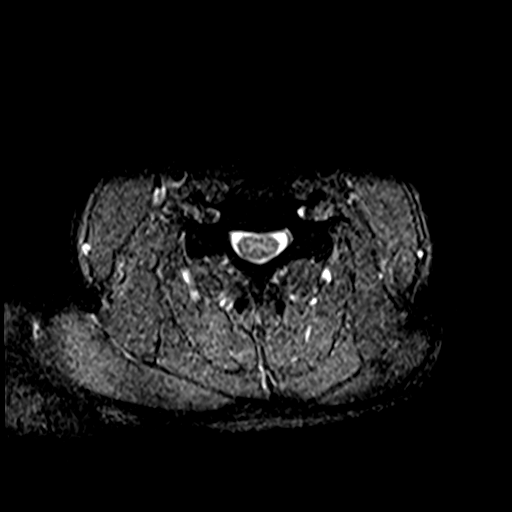
[im 20/29]
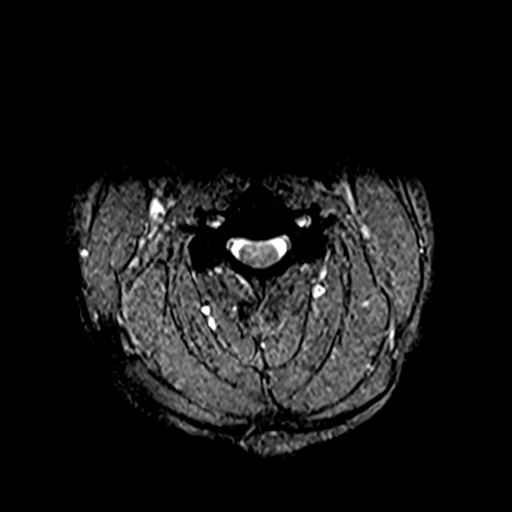
[im 24/29]
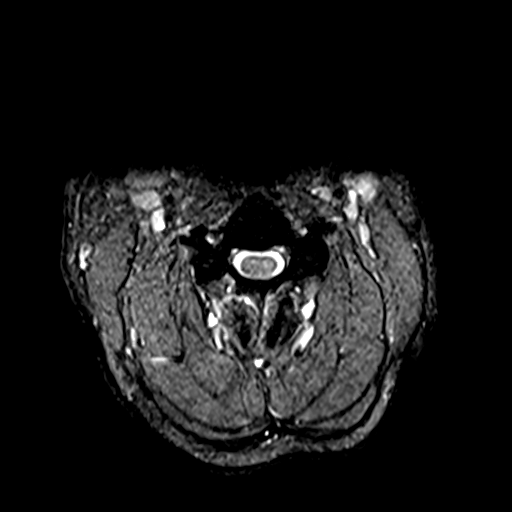
[im 29/29]
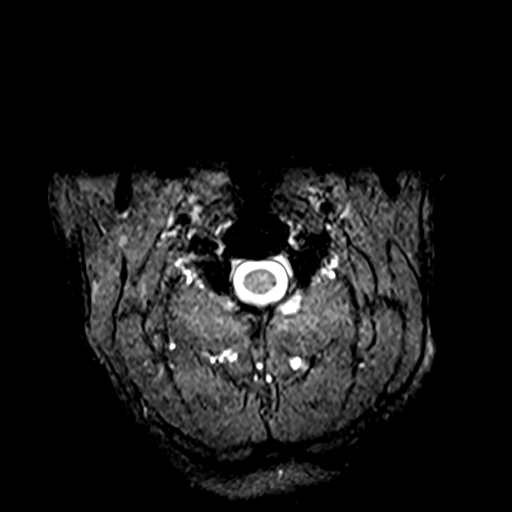

[39 of 48 positions shown; findings below may reference images not displayed]

FINDINGS: Alignment: Normal.

Vertebrae: No fracture, suspicious marrow lesion, or significant
marrow edema.

Cord: Normal signal.

Posterior Fossa, vertebral arteries, paraspinal tissues:
Unremarkable.

Disc levels:

C2-3: Negative.

C3-4: A small central disc protrusion in moderate spinal stenosis,
mildly indenting the ventral spinal cord. There is mild disc bulging
and uncovertebral spurring without significant neural foraminal
stenosis.

C4-5: Disc bulging and uncovertebral spurring result in mild spinal
stenosis and mild right neural foraminal stenosis.

C5-6: Moderate disc space narrowing. Disc bulging and uncovertebral
spurring result in mild spinal stenosis and moderate right and
mild-to-moderate left neural foraminal stenosis.

C6-7: A small central disc protrusion slightly indents the ventral
spinal cord without significant spinal stenosis. Disc bulging,
uncovertebral spurring, and a left foraminal disc protrusion result
in moderate left neural foraminal stenosis with potential left C7
nerve root impingement.

C7-T1: Mild facet arthrosis without stenosis.
IMPRESSION: 1. Left foraminal disc protrusion at C6-7 with moderate left neural
foraminal stenosis and potential left C7 nerve root impingement.
2. Moderate spinal stenosis at C3-4.
3. Mild spinal stenosis at C4-5 and C5-6.

## 2023-07-23 ENCOUNTER — Encounter (INDEPENDENT_AMBULATORY_CARE_PROVIDER_SITE_OTHER): Payer: Self-pay
